# Patient Record
Sex: Male | Born: 1945
Health system: Southern US, Community
[De-identification: ages and names within clinical notes are randomized; demographics above are authoritative.]

## PROBLEM LIST (undated history)

## (undated) DIAGNOSIS — I509 Heart failure, unspecified: Secondary | ICD-10-CM

## (undated) DIAGNOSIS — I10 Essential (primary) hypertension: Secondary | ICD-10-CM

## (undated) DIAGNOSIS — F0390 Unspecified dementia without behavioral disturbance: Secondary | ICD-10-CM

## (undated) DIAGNOSIS — F40298 Other specified phobia: Secondary | ICD-10-CM

## (undated) DIAGNOSIS — R7989 Other specified abnormal findings of blood chemistry: Secondary | ICD-10-CM

## (undated) DIAGNOSIS — Z87891 Personal history of nicotine dependence: Secondary | ICD-10-CM

## (undated) DIAGNOSIS — J45909 Unspecified asthma, uncomplicated: Secondary | ICD-10-CM

## (undated) DIAGNOSIS — D473 Essential (hemorrhagic) thrombocythemia: Secondary | ICD-10-CM

## (undated) DIAGNOSIS — D751 Secondary polycythemia: Secondary | ICD-10-CM

## (undated) DIAGNOSIS — N183 Chronic kidney disease, stage 3 unspecified: Secondary | ICD-10-CM

## (undated) DIAGNOSIS — N184 Chronic kidney disease, stage 4 (severe): Secondary | ICD-10-CM

## (undated) HISTORY — DX: Unspecified dementia, unspecified severity, without behavioral disturbance, psychotic disturbance, mood disturbance, and anxiety: F03.90

## (undated) HISTORY — DX: Chronic kidney disease, stage 4 (severe): N18.4

## (undated) HISTORY — DX: Heart failure, unspecified: I50.9

## (undated) HISTORY — DX: Unspecified asthma, uncomplicated: J45.909

---
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
## 2013-07-01 ENCOUNTER — Emergency Department (INDEPENDENT_AMBULATORY_CARE_PROVIDER_SITE_OTHER)
Admission: EM | Admit: 2013-07-01 | Discharge: 2013-07-01 | Disposition: A | Discharge status: Home / Self Care | Payer: Medicare PPO | Source: Home / Self Care | Attending: Family Medicine | Admitting: Family Medicine

## 2013-07-01 ENCOUNTER — Encounter (HOSPITAL_COMMUNITY): Payer: Self-pay | Admitting: Emergency Medicine

## 2013-07-01 DIAGNOSIS — I10 Essential (primary) hypertension: Secondary | ICD-10-CM

## 2013-07-01 HISTORY — DX: Essential (primary) hypertension: I10

## 2013-07-01 NOTE — ED Notes (Signed)
Pt  Is  OUT   Of  His  Lisinopril 10  Mg  Has  Not had  Any  In  sev months  He has  No  Dr in  [facility]         His  Only symptom  Is  Some  occasonal  dizzyness

## 2013-07-01 NOTE — ED Provider Notes (Signed)
Patient presents for refill of his 10 mg lisinopril. His blood pressure is very high. We attempted to obtain an i-STAT chem 8 for evaluation for renal dysfunction. At that time, the patient left our facility, stating he would rather have high blood pressure than get stuck with a needle. I offered to refill his medication without checking the blood tests, he still refused.  Filed Vitals:   07/01/13 1318  BP: 238/134  Pulse: 110  Temp: 97.9 F (36.6 C)  Resp: [address] PA-C 07/01/13 1342  Graylon Good, PA-C 07/01/13 1354

## 2013-07-01 NOTE — ED Notes (Signed)
Pt  Refused     To  Have    Blood         Work      Done  For       i     Stat        Pt   Offered    To speak  With    Ian Malkin the  Pa         But  He  Refused  And  [employer]  Refusing to  Speak to staff

## 2013-07-01 NOTE — ED Notes (Signed)
Went in to draw this pt's blood. This pt refused blood draw, stating, "Ain't nobody going to stick a needle in me. I'd rather just have high blood." Pt & family were counseled on the necessity of the blood test. Pt and family member began walking out of room. Alinda Money, RN & Zack, PA notified of same.

## 2013-07-03 NOTE — ED Provider Notes (Signed)
Medical screening examination/treatment/procedure(s) were performed by resident physician or non-physician practitioner and as supervising physician I was immediately available for consultation/collaboration.   Skylinn Vialpando,Daxtyn DOUGLAS MD.   Jarom D Kyleigha Markert, MD 07/03/13 1918 

## 2016-07-24 DIAGNOSIS — I10 Essential (primary) hypertension: Secondary | ICD-10-CM | POA: Diagnosis not present

## 2016-07-24 DIAGNOSIS — Z1211 Encounter for screening for malignant neoplasm of colon: Secondary | ICD-10-CM | POA: Diagnosis not present

## 2017-03-06 DIAGNOSIS — H40013 Open angle with borderline findings, low risk, bilateral: Secondary | ICD-10-CM | POA: Diagnosis not present

## 2017-03-06 DIAGNOSIS — H5213 Myopia, bilateral: Secondary | ICD-10-CM | POA: Diagnosis not present

## 2017-06-07 DIAGNOSIS — H40013 Open angle with borderline findings, low risk, bilateral: Secondary | ICD-10-CM | POA: Diagnosis not present

## 2017-07-25 DIAGNOSIS — H401131 Primary open-angle glaucoma, bilateral, mild stage: Secondary | ICD-10-CM | POA: Diagnosis not present

## 2017-12-31 DIAGNOSIS — H401131 Primary open-angle glaucoma, bilateral, mild stage: Secondary | ICD-10-CM | POA: Diagnosis not present

## 2018-06-05 DIAGNOSIS — H401131 Primary open-angle glaucoma, bilateral, mild stage: Secondary | ICD-10-CM | POA: Diagnosis not present

## 2018-11-05 DIAGNOSIS — H401132 Primary open-angle glaucoma, bilateral, moderate stage: Secondary | ICD-10-CM | POA: Diagnosis not present

## 2019-01-06 DIAGNOSIS — H401132 Primary open-angle glaucoma, bilateral, moderate stage: Secondary | ICD-10-CM | POA: Diagnosis not present

## 2019-01-09 ENCOUNTER — Encounter: Payer: Self-pay | Admitting: Family Medicine

## 2019-01-09 ENCOUNTER — Other Ambulatory Visit: Payer: Self-pay

## 2019-01-09 ENCOUNTER — Ambulatory Visit (INDEPENDENT_AMBULATORY_CARE_PROVIDER_SITE_OTHER): Payer: Medicare HMO | Admitting: Family Medicine

## 2019-01-09 VITALS — BP 177/107 | HR 98 | Temp 98.8°F | Wt 170.0 lb

## 2019-01-09 DIAGNOSIS — R5383 Other fatigue: Secondary | ICD-10-CM | POA: Insufficient documentation

## 2019-01-09 DIAGNOSIS — Z7689 Persons encountering health services in other specified circumstances: Secondary | ICD-10-CM

## 2019-01-09 DIAGNOSIS — Z9119 Patient's noncompliance with other medical treatment and regimen: Secondary | ICD-10-CM

## 2019-01-09 DIAGNOSIS — Z91199 Patient's noncompliance with other medical treatment and regimen due to unspecified reason: Secondary | ICD-10-CM

## 2019-01-09 DIAGNOSIS — I10 Essential (primary) hypertension: Secondary | ICD-10-CM | POA: Diagnosis not present

## 2019-01-09 NOTE — Progress Notes (Signed)
Subjective:  Documentation for virtual audio and video telecommunications through [place] encounter:  The patient was located at home. 2 patient identifiers used.  The provider was located in the office. The patient did consent to this visit and is aware of possible charges through their insurance for this visit.  The other persons participating in this telemedicine service were his daughter James Bryant.     Patient ID: James Bryant, male    DOB: 28-May-1946, 73 y.o.   MRN: [id number]  HPI Chief Complaint  Patient presents with  . bp issues    bp issues. elevated for a week. 170/99, been on bp med in the past   This is a virtual visit to establish care and to discuss HTN.   Previous medical care: Dr. Clayton Bryant" at [facility]. States he has not been there in at least one year.  He has no other providers.   His daughter is helping with the visit although the patient denies any memory or cognitive issues.   History of uncontrolled and untreated HTN for several years.   Last time he was on medication was more than one year. He was taking lisinopril at one point.   Stopped medication due to not being willing to have labs drawn to check renal function among other labs. His daughter states he is extremely fearful of needles.   Denies having any other significant PMH.   His daughter states over the past few months he has become increasingly tired and sits mostly during the day. She also reports LE edema to his bilateral feet and ankles.  Occasionally she has noticed that he takes a deep breath and seems winded when talking.   Denies fever, chills, chest pain, abdominal pain, N/V/D.   Former smoker and stopped many years ago.   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 Moved here from [place].    Review of Systems Pertinent positives and negatives in the history of present illness.     Objective:   Physical Exam BP (!) 177/107   Pulse 98   Temp 98.8 F (37.1 C) (Oral)   Wt 170  lb (77.1 kg)   Alert and in no acute distress.  Respirations appear unlabored.  Unable to perform a physical exam due to this being a virtual visit.      Assessment & Plan:  Uncontrolled hypertension  Encounter to establish care  Fatigue, unspecified type  Personal history of noncompliance with medical treatment, presenting hazards to health  Discussed limitations of a virtual visit.  This is particularly difficult due to him being a new patient. I will attempt to request recent medical records from [facility].  Discussed potential health consequences associated with uncontrolled hypertension.  He appears to have uncontrolled and untreated hypertension for several years. His daughter states that the patient is extremely fearful of needles and this is why he has not followed up with his PCP in the past as recommended. He is agreeable to come into the office next week for a visit so that I can properly examine him and he is aware that he will need an EKG and labs as well.  We will also need a urinalysis. Encourage them to bring in the blood pressure machine they are using so that we may compare it against ours. Advised patient and daughter that if he develops chest pain palpitations or shortness of breath that he should call 911 or go to the emergency department. Follow-up next week in my  office.   Time spent on call was 26 minutes and in review of previous records 3 minutes total.  This virtual service is not related to other E/M service within previous 7 days.  99441 (5-22min) 99442 (11-28min) 99443 (21-31min)

## 2019-01-15 ENCOUNTER — Other Ambulatory Visit: Payer: Self-pay

## 2019-01-15 ENCOUNTER — Telehealth: Payer: Self-pay | Admitting: Internal Medicine

## 2019-01-15 ENCOUNTER — Ambulatory Visit (INDEPENDENT_AMBULATORY_CARE_PROVIDER_SITE_OTHER): Payer: Medicare HMO | Admitting: Family Medicine

## 2019-01-15 ENCOUNTER — Encounter: Payer: Self-pay | Admitting: Family Medicine

## 2019-01-15 VITALS — BP 150/100 | HR 117 | Temp 96.4°F | Resp 16 | Ht 65.0 in | Wt 168.6 lb

## 2019-01-15 DIAGNOSIS — R5383 Other fatigue: Secondary | ICD-10-CM | POA: Diagnosis not present

## 2019-01-15 DIAGNOSIS — R809 Proteinuria, unspecified: Secondary | ICD-10-CM | POA: Diagnosis not present

## 2019-01-15 DIAGNOSIS — I10 Essential (primary) hypertension: Secondary | ICD-10-CM | POA: Diagnosis not present

## 2019-01-15 DIAGNOSIS — R9431 Abnormal electrocardiogram [ECG] [EKG]: Secondary | ICD-10-CM

## 2019-01-15 LAB — POCT URINALYSIS DIP (PROADVANTAGE DEVICE)
Glucose, UA: NEGATIVE mg/dL
Ketones, POC UA: NEGATIVE mg/dL
Leukocytes, UA: NEGATIVE
Nitrite, UA: NEGATIVE
Protein Ur, POC: 100 mg/dL — AB
Specific Gravity, Urine: 1.03
Urobilinogen, Ur: NEGATIVE
pH, UA: 6 (ref 5.0–8.0)

## 2019-01-15 NOTE — Telephone Encounter (Signed)
I am referring him to cardiology. Hoped to get the echo ahead of time so that they would have this information but since it is not approved, I will defer to the cardiologist for further testing. Thanks.

## 2019-01-15 NOTE — Telephone Encounter (Signed)
Echocardiogram was not approved and has to go through clincial review. Pt's insurance company will fax over form to be completed and needs to be faxed back to them at [place] phone number [phone] Imaging location- [facility]- [address] Reference number - [id number]

## 2019-01-15 NOTE — Patient Instructions (Signed)
DASH Eating Plan  DASH stands for "Dietary Approaches to Stop Hypertension." The DASH eating plan is a healthy eating plan that has been shown to reduce high blood pressure (hypertension). It may also reduce your risk for type 2 diabetes, heart disease, and stroke. The DASH eating plan may also help with weight loss.  What are tips for following this plan?    General guidelines   Avoid eating more than 2,300 mg (milligrams) of salt (sodium) a day. If you have hypertension, you may need to reduce your sodium intake to 1,500 mg a day.   Limit alcohol intake to no more than 1 drink a day for nonpregnant women and 2 drinks a day for men. One drink equals 12 oz of beer, 5 oz of wine, or 1 oz of hard liquor.   Work with your health care provider to maintain a healthy body weight or to lose weight. Ask what an ideal weight is for you.   Get at least 30 minutes of exercise that causes your heart to beat faster (aerobic exercise) most days of the week. Activities may include walking, swimming, or biking.   Work with your health care provider or diet and nutrition specialist (dietitian) to adjust your eating plan to your individual calorie needs.  Reading food labels     Check food labels for the amount of sodium per serving. Choose foods with less than 5 percent of the Daily Value of sodium. Generally, foods with less than 300 mg of sodium per serving fit into this eating plan.   To find whole grains, look for the word "whole" as the first word in the ingredient list.  Shopping   Buy products labeled as "low-sodium" or "no salt added."   Buy fresh foods. Avoid canned foods and premade or frozen meals.  Cooking   Avoid adding salt when cooking. Use salt-free seasonings or herbs instead of table salt or sea salt. Check with your health care provider or pharmacist before using salt substitutes.   Do not fry foods. Cook foods using healthy methods such as baking, boiling, grilling, and broiling instead.   Cook with  heart-healthy oils, such as olive, canola, soybean, or sunflower oil.  Meal planning   Eat a balanced diet that includes:  ? 5 or more servings of fruits and vegetables each day. At each meal, try to fill half of your plate with fruits and vegetables.  ? Up to 6-8 servings of whole grains each day.  ? Less than 6 oz of lean meat, poultry, or fish each day. A 3-oz serving of meat is about the same size as a deck of cards. One egg equals 1 oz.  ? 2 servings of low-fat dairy each day.  ? A serving of nuts, seeds, or beans 5 times each week.  ? Heart-healthy fats. Healthy fats called Omega-3 fatty acids are found in foods such as flaxseeds and coldwater fish, like sardines, salmon, and mackerel.   Limit how much you eat of the following:  ? Canned or prepackaged foods.  ? Food that is high in trans fat, such as fried foods.  ? Food that is high in saturated fat, such as fatty meat.  ? Sweets, desserts, sugary drinks, and other foods with added sugar.  ? Full-fat dairy products.   Do not salt foods before eating.   Try to eat at least 2 vegetarian meals each week.   Eat more home-cooked food and less restaurant, buffet, and fast food.     When eating at a restaurant, ask that your food be prepared with less salt or no salt, if possible.  What foods are recommended?  The items listed may not be a complete list. Talk with your dietitian about what dietary choices are best for you.  Grains  Whole-grain or whole-wheat bread. Whole-grain or whole-wheat pasta. Brown rice. Oatmeal. Quinoa. Bulgur. Whole-grain and low-sodium cereals. Pita bread. Low-fat, low-sodium crackers. Whole-wheat flour tortillas.  Vegetables  Fresh or frozen vegetables (raw, steamed, roasted, or grilled). Low-sodium or reduced-sodium tomato and vegetable juice. Low-sodium or reduced-sodium tomato sauce and tomato paste. Low-sodium or reduced-sodium canned vegetables.  Fruits  All fresh, dried, or frozen fruit. Canned fruit in natural juice (without  added sugar).  Meat and other protein foods  Skinless chicken or turkey. Ground chicken or turkey. Pork with fat trimmed off. Fish and seafood. Egg whites. Dried beans, peas, or lentils. Unsalted nuts, nut butters, and seeds. Unsalted canned beans. Lean cuts of beef with fat trimmed off. Low-sodium, lean deli meat.  Dairy  Low-fat (1%) or fat-free (skim) milk. Fat-free, low-fat, or reduced-fat cheeses. Nonfat, low-sodium ricotta or cottage cheese. Low-fat or nonfat yogurt. Low-fat, low-sodium cheese.  Fats and oils  Soft margarine without trans fats. Vegetable oil. Low-fat, reduced-fat, or light mayonnaise and salad dressings (reduced-sodium). Canola, safflower, olive, soybean, and sunflower oils. Avocado.  Seasoning and other foods  Herbs. Spices. Seasoning mixes without salt. Unsalted popcorn and pretzels. Fat-free sweets.  What foods are not recommended?  The items listed may not be a complete list. Talk with your dietitian about what dietary choices are best for you.  Grains  Baked goods made with fat, such as croissants, muffins, or some breads. Dry pasta or rice meal packs.  Vegetables  Creamed or fried vegetables. Vegetables in a cheese sauce. Regular canned vegetables (not low-sodium or reduced-sodium). Regular canned tomato sauce and paste (not low-sodium or reduced-sodium). Regular tomato and vegetable juice (not low-sodium or reduced-sodium). Pickles. Olives.  Fruits  Canned fruit in a light or heavy syrup. Fried fruit. Fruit in cream or butter sauce.  Meat and other protein foods  Fatty cuts of meat. Ribs. Fried meat. Bacon. Sausage. Bologna and other processed lunch meats. Salami. Fatback. Hotdogs. Bratwurst. Salted nuts and seeds. Canned beans with added salt. Canned or smoked fish. Whole eggs or egg yolks. Chicken or turkey with skin.  Dairy  Whole or 2% milk, cream, and half-and-half. Whole or full-fat cream cheese. Whole-fat or sweetened yogurt. Full-fat cheese. Nondairy creamers. Whipped toppings.  Processed cheese and cheese spreads.  Fats and oils  Butter. Stick margarine. Lard. Shortening. Ghee. Bacon fat. Tropical oils, such as coconut, palm kernel, or palm oil.  Seasoning and other foods  Salted popcorn and pretzels. Onion salt, garlic salt, seasoned salt, table salt, and sea salt. Worcestershire sauce. Tartar sauce. Barbecue sauce. Teriyaki sauce. Soy sauce, including reduced-sodium. Steak sauce. Canned and packaged gravies. Fish sauce. Oyster sauce. Cocktail sauce. Horseradish that you find on the shelf. Ketchup. Mustard. Meat flavorings and tenderizers. Bouillon cubes. Hot sauce and Tabasco sauce. Premade or packaged marinades. Premade or packaged taco seasonings. Relishes. Regular salad dressings.  Where to find more information:   National Heart, Lung, and Blood Institute: www.nhlbi.nih.gov   American Heart Association: www.heart.org  Summary   The DASH eating plan is a healthy eating plan that has been shown to reduce high blood pressure (hypertension). It may also reduce your risk for type 2 diabetes, heart disease, and stroke.   With the   DASH eating plan, you should limit salt (sodium) intake to 2,300 mg a day. If you have hypertension, you may need to reduce your sodium intake to 1,500 mg a day.   When on the DASH eating plan, aim to eat more fresh fruits and vegetables, whole grains, lean proteins, low-fat dairy, and heart-healthy fats.   Work with your health care provider or diet and nutrition specialist (dietitian) to adjust your eating plan to your individual calorie needs.  This information is not intended to replace advice given to you by your health care provider. Make sure you discuss any questions you have with your health care provider.  Document Released: 07/27/2011 Document Revised: 07/31/2016 Document Reviewed: 07/31/2016  Elsevier Interactive Patient Education  2019 Elsevier Inc.

## 2019-01-15 NOTE — Progress Notes (Signed)
Subjective:    Patient ID: James Bryant, male    DOB: 1946-01-15, 73 y.o.   MRN: [id number]  HPI Chief Complaint  Patient presents with  . follow-up    fasting follow-up,    James Bryant is new to me. Is from [place] but lived in [place], [place] for many years and moved back here to be close to his daughter. His wife is with him today.  No records from [place] available today. Does not appear to have had regular medical care in years.   We did a virtual visit last week to establish care and discuss uncontrolled HTN.  James Bryant has not been on medication for HTN for at least 2-3 years due to not being willing to have blood work done. States James Bryant is "deathly afraid of needles".   Denies any other significant PMH. James Bryant recalls chest pain while living in [place] and was told in the ED that James Bryant had an "old heart attack".   Denies fever, chills, unexplained weight loss, dizziness, headache, vision changes, chest pain, palpitations, shortness of breath, orthopnea, abdominal pain, N/V/D, urinary symptoms, LE edema.   Retired Administrator.  Stopped drinking alcohol 7-8 years ago.  Stopped smoking 21 years ago.  Does not exercise.  Diet is fairly healthy. States James Bryant does not eat many fried foods.   Reviewed allergies, medications, past medical, surgical, family, and social history.    Review of Systems Pertinent positives and negatives in the history of present illness.     Objective:   Physical Exam Constitutional:      General: James Bryant is not in acute distress.    Appearance: Normal appearance. James Bryant is normal weight. James Bryant is not ill-appearing.  Eyes:     Conjunctiva/sclera: Conjunctivae normal.     Pupils: Pupils are equal, round, and reactive to light.  Neck:     Musculoskeletal: Normal range of motion and neck supple.  Cardiovascular:     Rate and Rhythm: Normal rate and regular rhythm.     Pulses: Normal pulses.  Pulmonary:     Effort: Pulmonary effort is normal.     Breath sounds: Normal breath sounds.   Musculoskeletal:     Right lower leg: No edema.     Left lower leg: No edema.  Skin:    General: Skin is warm and dry.     Capillary Refill: Capillary refill takes less than 2 seconds.  Neurological:     General: No focal deficit present.     Mental Status: James Bryant is alert. Mental status is at baseline.     Cranial Nerves: No cranial nerve deficit.     Sensory: No sensory deficit.     Motor: No weakness.     Deep Tendon Reflexes: Reflexes normal.  Psychiatric:        Mood and Affect: Mood normal.        Thought Content: Thought content normal.    BP (!) 150/100   Pulse (!) 117   Temp (!) 96.4 F (35.8 C) (Tympanic)   Resp 16   Ht 5\' 5"  (1.651 m)   Wt 168 lb 9.6 oz (76.5 kg)   SpO2 98%   BMI 28.06 kg/m        Assessment & Plan:  Uncontrolled hypertension - Plan: CBC with Differential/Platelet, Comprehensive metabolic panel, POCT Urinalysis DIP (Proadvantage Device), Lipid panel, EKG 12-Lead, ECHOCARDIOGRAM COMPLETE  Fatigue, unspecified type - Plan: CBC with Differential/Platelet, Comprehensive metabolic panel, POCT Urinalysis DIP (Proadvantage Device), TSH, T4, free,  Hepatitis C antibody, HIV Antibody (routine testing w rflx), Lipid panel, VITAMIN D 25 Hydroxy (Vit-D Deficiency, Fractures), Vitamin B12, EKG 12-Lead, ECHOCARDIOGRAM COMPLETE  Proteinuria, unspecified type  Abnormal EKG - Plan: Lipid panel, ECHOCARDIOGRAM COMPLETE it was a pleasure seeing Mr. Sheard today.  James Bryant is new to me and has not had any regular health care in years.  James Bryant is terribly afraid of needles and apparently this has been an obstacle in him receiving medical care in the past. James Bryant has not been on medication for HTN for the past 2-3 years.  It took much convincing for him to agree to having blood work done today. I did stay in the room and talk to him, using distraction to help him get the blood work. James Bryant was appreciative.  ECG shows old septal infarct. James Bryant is not currently complaining of DOE,  exertional chest pain or PND.  Planned to get an echo but this was not approved. I will refer him to cardiology for further evaluation.  Awaiting labs and will start him on medications for HTN. I will also appreciate cardiology recommendation for this.  Urinalysis dipstick shows a moderate amount of protein. Check renal function and follow up.  Discussed multiple etiologies for fatigue and James Bryant thinks this is related to be bored since the pandemic.  No sign of fluid overload.  Follow up pending labs.

## 2019-01-16 ENCOUNTER — Other Ambulatory Visit: Payer: Self-pay | Admitting: Family Medicine

## 2019-01-16 DIAGNOSIS — I10 Essential (primary) hypertension: Secondary | ICD-10-CM

## 2019-01-16 MED ORDER — AMLODIPINE BESYLATE 5 MG PO TABS: 5.0000 mg | ORAL_TABLET | Freq: Every day | ORAL | 1 refills | Status: DC

## 2019-01-16 MED ORDER — HYDROCHLOROTHIAZIDE 12.5 MG PO CAPS: 12.5000 mg | ORAL_CAPSULE | Freq: Every day | ORAL | 1 refills | Status: DC

## 2019-01-16 NOTE — Telephone Encounter (Signed)
Pt was notified and referral as been sent

## 2019-01-17 ENCOUNTER — Encounter: Payer: Self-pay | Admitting: Family Medicine

## 2019-01-17 ENCOUNTER — Other Ambulatory Visit: Payer: Self-pay | Admitting: Family Medicine

## 2019-01-17 DIAGNOSIS — D75839 Thrombocytosis, unspecified: Secondary | ICD-10-CM

## 2019-01-17 DIAGNOSIS — F40231 Fear of injections and transfusions: Secondary | ICD-10-CM | POA: Insufficient documentation

## 2019-01-17 DIAGNOSIS — F40298 Other specified phobia: Secondary | ICD-10-CM

## 2019-01-17 DIAGNOSIS — R7989 Other specified abnormal findings of blood chemistry: Secondary | ICD-10-CM | POA: Insufficient documentation

## 2019-01-17 DIAGNOSIS — D751 Secondary polycythemia: Secondary | ICD-10-CM

## 2019-01-17 DIAGNOSIS — D473 Essential (hemorrhagic) thrombocythemia: Secondary | ICD-10-CM

## 2019-01-17 DIAGNOSIS — Z87891 Personal history of nicotine dependence: Secondary | ICD-10-CM

## 2019-01-17 HISTORY — DX: Other specified abnormal findings of blood chemistry: R79.89

## 2019-01-17 HISTORY — DX: Personal history of nicotine dependence: Z87.891

## 2019-01-17 HISTORY — DX: Thrombocytosis, unspecified: D75.839

## 2019-01-17 HISTORY — DX: Other specified phobia: F40.298

## 2019-01-17 HISTORY — DX: Secondary polycythemia: D75.1

## 2019-01-17 MED ORDER — AMLODIPINE BESYLATE 5 MG PO TABS: 5.0000 mg | ORAL_TABLET | Freq: Every day | ORAL | 0 refills | Status: DC

## 2019-01-17 MED ORDER — HYDROCHLOROTHIAZIDE 12.5 MG PO CAPS: 12.5000 mg | ORAL_CAPSULE | Freq: Every day | ORAL | 0 refills | Status: DC

## 2019-01-21 ENCOUNTER — Telehealth: Payer: Self-pay | Admitting: Hematology and Oncology

## 2019-01-21 LAB — COMPREHENSIVE METABOLIC PANEL
ALT: 86 IU/L — ABNORMAL HIGH (ref 0–44)
AST: 66 IU/L — ABNORMAL HIGH (ref 0–40)
Albumin/Globulin Ratio: 1.4 (ref 1.2–2.2)
Albumin: 4.9 g/dL — ABNORMAL HIGH (ref 3.7–4.7)
Alkaline Phosphatase: 193 IU/L — ABNORMAL HIGH (ref 39–117)
BUN/Creatinine Ratio: 12 (ref 10–24)
BUN: 18 mg/dL (ref 8–27)
Bilirubin Total: 1.9 mg/dL — ABNORMAL HIGH (ref 0.0–1.2)
CO2: 18 mmol/L — ABNORMAL LOW (ref 20–29)
Calcium: 10.7 mg/dL — ABNORMAL HIGH (ref 8.6–10.2)
Chloride: 95 mmol/L — ABNORMAL LOW (ref 96–106)
Creatinine, Ser: 1.53 mg/dL — ABNORMAL HIGH (ref 0.76–1.27)
GFR calc Af Amer: 51 mL/min/{1.73_m2} — ABNORMAL LOW (ref >59)
GFR calc non Af Amer: 44 mL/min/{1.73_m2} — ABNORMAL LOW (ref >59)
Globulin, Total: 3.4 g/dL (ref 1.5–4.5)
Glucose: 89 mg/dL (ref 65–99)
Potassium: 5.3 mmol/L — ABNORMAL HIGH (ref 3.5–5.2)
Sodium: 142 mmol/L (ref 134–144)
Total Protein: 8.3 g/dL (ref 6.0–8.5)

## 2019-01-21 LAB — CBC WITH DIFFERENTIAL/PLATELET
Basophils Absolute: 0.2 10*3/uL (ref 0.0–0.2)
Basos: 2 %
EOS (ABSOLUTE): 0.2 10*3/uL (ref 0.0–0.4)
Eos: 2 %
Hematocrit: 65.6 % (ref 37.5–51.0)
Hemoglobin: 20.9 g/dL (ref 13.0–17.7)
Immature Grans (Abs): 0 10*3/uL (ref 0.0–0.1)
Immature Granulocytes: 0 %
Lymphocytes Absolute: 3.8 10*3/uL — ABNORMAL HIGH (ref 0.7–3.1)
Lymphs: 31 %
MCH: 24 pg — ABNORMAL LOW (ref 26.6–33.0)
MCHC: 31.9 g/dL (ref 31.5–35.7)
MCV: 75 fL — ABNORMAL LOW (ref 79–97)
Monocytes Absolute: 1 10*3/uL — ABNORMAL HIGH (ref 0.1–0.9)
Monocytes: 8 %
Neutrophils Absolute: 7.2 10*3/uL — ABNORMAL HIGH (ref 1.4–7.0)
Neutrophils: 57 %
Platelets: 727 10*3/uL — ABNORMAL HIGH (ref 150–450)
RBC: 8.7 x10E6/uL (ref 4.14–5.80)
RDW: 24.1 % — ABNORMAL HIGH (ref 11.6–15.4)
WBC: 12.4 10*3/uL — ABNORMAL HIGH (ref 3.4–10.8)

## 2019-01-21 LAB — TSH: TSH: 2.12 u[IU]/mL (ref 0.450–4.500)

## 2019-01-21 LAB — VITAMIN B12: Vitamin B-12: 1279 pg/mL — ABNORMAL HIGH (ref 232–1245)

## 2019-01-21 LAB — VITAMIN D 25 HYDROXY (VIT D DEFICIENCY, FRACTURES): Vit D, 25-Hydroxy: 19.3 ng/mL — ABNORMAL LOW (ref 30.0–100.0)

## 2019-01-21 LAB — LIPID PANEL
Chol/HDL Ratio: 3.5 ratio (ref 0.0–5.0)
Cholesterol, Total: 218 mg/dL — ABNORMAL HIGH (ref 100–199)
HDL: 62 mg/dL (ref >39)
LDL Calculated: 139 mg/dL — ABNORMAL HIGH (ref 0–99)
Triglycerides: 87 mg/dL (ref 0–149)
VLDL Cholesterol Cal: 17 mg/dL (ref 5–40)

## 2019-01-21 LAB — T4, FREE: Free T4: 1.47 ng/dL (ref 0.82–1.77)

## 2019-01-21 LAB — HIV ANTIBODY (ROUTINE TESTING W REFLEX): HIV Screen 4th Generation wRfx: NONREACTIVE

## 2019-01-21 LAB — HEPATITIS C ANTIBODY

## 2019-01-21 NOTE — Telephone Encounter (Signed)
Pt's wife cld to sch a hem appt. Appt has been sch for the pt to see Dr. Lindi Adie on 6/11 at 930am. Aware to arrive 20 minutes early to be checked in on time. Address and location to our facility provided.

## 2019-01-29 ENCOUNTER — Other Ambulatory Visit: Payer: Medicare HMO

## 2019-01-30 ENCOUNTER — Telehealth: Payer: Self-pay | Admitting: Internal Medicine

## 2019-01-30 ENCOUNTER — Encounter: Payer: Medicare PPO | Admitting: Hematology and Oncology

## 2019-01-30 NOTE — Telephone Encounter (Signed)
I received a call from the pt's wife to r/sMR. Kelemen's appt. A new appt has been scheduled for the pt to see Dr. Walden Field on 6/24 at 1050am. Aware that Mr. Whitebread will need to arrive 20 minutes early.

## 2019-02-10 ENCOUNTER — Telehealth: Payer: Self-pay

## 2019-02-10 NOTE — Telephone Encounter (Signed)
Left message for patient to call back regarding appointment with Dr. Acie Fredrickson on 6/22 to obtain virtual visit consent. Call placed due to restrictions enacted for Covid 19.

## 2019-02-11 ENCOUNTER — Telehealth: Payer: Self-pay

## 2019-02-11 ENCOUNTER — Encounter: Payer: Self-pay | Admitting: Cardiovascular Disease

## 2019-02-11 ENCOUNTER — Telehealth (INDEPENDENT_AMBULATORY_CARE_PROVIDER_SITE_OTHER): Payer: Medicare HMO | Admitting: Cardiovascular Disease

## 2019-02-11 ENCOUNTER — Other Ambulatory Visit: Payer: Self-pay

## 2019-02-11 VITALS — BP 144/98 | HR 102 | Ht 67.0 in

## 2019-02-11 DIAGNOSIS — Z7189 Other specified counseling: Secondary | ICD-10-CM

## 2019-02-11 DIAGNOSIS — I10 Essential (primary) hypertension: Secondary | ICD-10-CM

## 2019-02-11 MED ORDER — CARVEDILOL 6.25 MG PO TABS: 6.2500 mg | ORAL_TABLET | Freq: Two times a day (BID) | ORAL | 3 refills | Status: DC

## 2019-02-11 MED ORDER — HYDROCHLOROTHIAZIDE 25 MG PO TABS: 25.0000 mg | ORAL_TABLET | Freq: Every day | ORAL | 3 refills | Status: DC

## 2019-02-11 MED ORDER — CARVEDILOL 6.25 MG PO TABS: 6.2500 mg | ORAL_TABLET | Freq: Two times a day (BID) | ORAL | 0 refills | Status: DC

## 2019-02-11 NOTE — Patient Instructions (Signed)
Medication Instructions:  1) INCREASE HYDROCHLOROTHIAZIDE to 25 mg daily 2) START CARVEDILOL (COREG) 6.25 mg twice daily  Labwork: Your provider recommends that you return for lab work in: 3 weeks.  Follow-Up: Your provider recommends that you schedule a follow-up appointment in 6 weeks with Dr. Acie Fredrickson or his assistant.

## 2019-02-11 NOTE — Progress Notes (Signed)
Virtual Visit via Video Note   This visit type was conducted due to national recommendations for restrictions regarding the COVID-19 Pandemic (e.g. social distancing) in an effort to limit this patient's exposure and mitigate transmission in our community.  Due to his co-morbid illnesses, this patient is at least at moderate risk for complications without adequate follow up.  This format is felt to be most appropriate for this patient at this time.  All issues noted in this document were discussed and addressed.  A limited physical exam was performed with this format.  Please refer to the patient's chart for his consent to telehealth for [facility].   Date:  02/11/2019   ID:  James, Bryant 01-Jul-1946, MRN [id number]  Patient Location: Home Provider Location: Office  PCP:  Girtha Rm, NP-C  Cardiologist:  Nahser ,  Electrophysiologist:  None   Evaluation Performed:  New Patient Evaluation  Chief Complaint:  HTN  History of Present Illness:    James Bryant is a 73 y.o. male with  Seen with daughter, Nilda Simmer,   We were asked to see him by Harland Dingwall, NP for further eval of his HTN.    Was recently started on amlodipine  5 mg a day and HCTZ 12. 5 a day several weeks.  Still eating salty foods.  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    Past Medical History:  Diagnosis Date  . Elevated liver function tests 01/17/2019  . Elevated serum creatinine 01/17/2019  . Fear of needles 01/17/2019  . Former smoker, stopped smoking many years ago 01/17/2019  . Hypertension   . Polycythemia 01/17/2019  . Thrombocytosis ([place]) 01/17/2019   No past surgical history on file.   Current Meds  Medication Sig  . amLODipine (NORVASC) 5 MG tablet Take 1 tablet (5 mg total) by mouth daily.  Marland Kitchen  aspirin EC 81 MG tablet Take 81 mg by mouth daily.  . hydrochlorothiazide (MICROZIDE) 12.5 MG capsule Take 1 capsule (12.5 mg total) by mouth daily.  Marland Kitchen latanoprost (XALATAN) 0.005 % ophthalmic solution Place 1 drop into both eyes 2 (two) times a day.     Allergies:   Patient has no known allergies.   Social History   Tobacco Use  . Smoking status: Former Research scientist (life sciences)  . Smokeless tobacco: Never Used  Substance Use Topics  . Alcohol use: No    Comment: stopped many years ago   . Drug use: Never     Family Hx: The patient's family history includes Heart disease in his mother; Hypertension in his mother.  ROS:   Please see the history of present illness.     All other systems reviewed and are negative.   Prior CV studies:   The following studies were reviewed today:    Labs/Other Tests and Data Reviewed:    EKG:  An ECG dated Jan 15, 2019 was personally reviewed today and demonstrated:  sinus tach at 113.    Recent Labs: 01/15/2019: ALT 86; BUN 18; Creatinine, Ser 1.53; Hemoglobin 20.9; Platelets 727; Potassium 5.3; Sodium 142; TSH 2.120   Recent Lipid Panel Lab Results  Component Value Date/Time   CHOL 218 (H) 01/15/2019 12:32 PM   TRIG 87 01/15/2019 12:32 PM   HDL 62 01/15/2019 12:32 PM   CHOLHDL 3.5  01/15/2019 12:32 PM   LDLCALC 139 (H) 01/15/2019 12:32 PM    Wt Readings from Last 3 Encounters:  01/15/19 168 lb 9.6 oz (76.5 kg)  01/09/19 170 lb (77.1 kg)     Objective:    Vital Signs:  BP (!) 144/98 (BP Location: Right Arm, Patient Position: Sitting, Cuff Size: Normal)   Pulse (!) 102   Ht 5\' 7"  (1.702 m)   BMI 26.41 kg/m    VITAL SIGNS:  reviewed GEN:  no acute distress EYES:  sclerae anicteric, EOMI - Extraocular Movements Intact RESPIRATORY:  normal respiratory effort, symmetric expansion CARDIOVASCULAR:  no peripheral edema SKIN:  no rash, lesions or ulcers. MUSCULOSKELETAL:  no obvious deformities. NEURO:  alert and oriented x 3, no obvious focal  deficit PSYCH:  normal affect  ASSESSMENT & PLAN:    1. HTN:  BP remains elevated : He still eats a fair amount of salt.  In fact in reviewing his diet, he eats a very high salty diet.  Of counseled him about avoiding excessive salt.  We will increase the hydrochlorothiazide to 25 mg a day.  We will add carvedilol 6.25 PO  twice a day. We will check a basic metabolic profile in 3 to 4 weeks.  We will see him here in the office or virtually in approximately 6 weeks.  2.  Sinus tachycardia : adding coreg 6.25 BID   Encouraged him to exercise    COVID-19 Education: The signs and symptoms of COVID-19 were discussed with the patient and how to seek care for testing (follow up with PCP or arrange E-visit).  The importance of social distancing was discussed today.  Time:   Today, I have spent  35   minutes with the patient with telehealth technology discussing the above problems.     Medication Adjustments/Labs and Tests Ordered: Current medicines are reviewed at length with the patient today.  Concerns regarding medicines are outlined above.   Tests Ordered: No orders of the defined types were placed in this encounter.   Medication Changes: No orders of the defined types were placed in this encounter.   Follow Up:  In Person in 6 week(s)  Signed, Mertie Moores, MD  02/11/2019 9:21 AM    [place] '

## 2019-02-11 NOTE — Telephone Encounter (Addendum)
Spoke with pt and obtained verbal consent for video visit.    YOUR CARDIOLOGY TEAM HAS ARRANGED FOR AN E-VISIT FOR YOUR APPOINTMENT - PLEASE REVIEW IMPORTANT INFORMATION BELOW SEVERAL DAYS PRIOR TO YOUR APPOINTMENT  Due to the recent COVID-19 pandemic, we are transitioning in-person office visits to tele-medicine visits in an effort to decrease unnecessary exposure to our patients, their families, and staff. These visits are billed to your insurance just like a normal visit is. We also encourage you to sign up for MyChart if you have not already done so. You will need a smartphone if possible. For patients that do not have this, we can still complete the visit using a regular telephone but do prefer a smartphone to enable video when possible. You may have a family member that lives with you that can help. If possible, we also ask that you have a blood pressure cuff and scale at home to measure your blood pressure, heart rate and weight prior to your scheduled appointment. Patients with clinical needs that need an in-person evaluation and testing will still be able to come to the office if absolutely necessary. If you have any questions, feel free to call our office.     YOUR PROVIDER WILL BE USING THE FOLLOWING PLATFORM TO COMPLETE YOUR VISIT: [email or web address]  . IF USING MYCHART - How to Download the MyChart App to Your SmartPhone   - If Apple, go to [employer] and type in MyChart in the search bar and download the app. If Android, ask patient to go to [employer] and type in [place] in the search bar and download the app. The app is free but as with any other app downloads, your phone may require you to verify saved payment information or Apple/Android password.  - You will need to then log into the app with your MyChart username and password, and select [facility] as your healthcare provider to link the account.  - When it is time for your visit, go to the MyChart app, find appointments, and click  Begin Video Visit. Be sure to Select Allow for your device to access the Microphone and Camera for your visit. You will then be connected, and your provider will be with you shortly.  **If you have any issues connecting or need assistance, please contact MyChart service desk [phone])**  **If using a computer, in order to ensure the best quality for your visit, you will need to use either of the following Internet Browsers: Insurance underwriter or [employer]**  . IF USING DOXIMITY or [email or web address].ME - The staff will give you instructions on receiving your link to join the meeting the day of your visit.      2-3 DAYS BEFORE YOUR APPOINTMENT  You will receive a telephone call from one of our [place] team members - your caller ID may say "Unknown caller." If this is a video visit, we will walk you through how to get the video launched on your phone. We will remind you check your blood pressure, heart rate and weight prior to your scheduled appointment. If you have an Apple Watch or Kardia, please upload any pertinent ECG strips the day before or morning of your appointment to [place]. Our staff will also make sure you have reviewed the consent and agree to move forward with your scheduled tele-health visit.     THE DAY OF YOUR APPOINTMENT  Approximately 15 minutes prior to your scheduled appointment, you will receive a telephone call from  one of [facility] team - your caller ID may say "Unknown caller."  Our staff will confirm medications, vital signs for the day and any symptoms you may be experiencing. Please have this information available prior to the time of visit start. It may also be helpful for you to have a pad of paper and pen handy for any instructions given during your visit. They will also walk you through joining the smartphone meeting if this is a video visit.    CONSENT FOR TELE-HEALTH VISIT - PLEASE REVIEW  I hereby voluntarily request, consent and authorize [facility] and its employed or contracted physicians, physician assistants, nurse practitioners or other licensed health care professionals (the Practitioner), to provide me with telemedicine health care services (the "Services") as deemed necessary by the treating Practitioner. I acknowledge and consent to receive the Services by the Practitioner via telemedicine. I understand that the telemedicine visit will involve communicating with the Practitioner through live audiovisual communication technology and the disclosure of certain medical information by electronic transmission. I acknowledge that I have been given the opportunity to request an in-person assessment or other available alternative prior to the telemedicine visit and am voluntarily participating in the telemedicine visit.  I understand that I have the right to withhold or withdraw my consent to the use of telemedicine in the course of my care at any time, without affecting my right to future care or treatment, and that the Practitioner or I may terminate the telemedicine visit at any time. I understand that I have the right to inspect all information obtained and/or recorded in the course of the telemedicine visit and may receive copies of available information for a reasonable fee.  I understand that some of the potential risks of receiving the Services via telemedicine include:  Marland Kitchen Delay or interruption in medical evaluation due to technological equipment failure or disruption; . Information transmitted may not be sufficient (e.g. poor resolution of images) to allow for appropriate medical decision making by the Practitioner; and/or  . In rare instances, security protocols could fail, causing a breach of personal health information.  Furthermore, I acknowledge that it is my responsibility to provide information about my medical history, conditions and care that is complete and accurate to the best of my ability. I acknowledge that Practitioner's  advice, recommendations, and/or decision may be based on factors not within their control, such as incomplete or inaccurate data provided by me or distortions of diagnostic images or specimens that may result from electronic transmissions. I understand that the practice of medicine is not an exact science and that Practitioner makes no warranties or guarantees regarding treatment outcomes. I acknowledge that I will receive a copy of this consent concurrently upon execution via email to the email address I last provided but may also request a printed copy by calling the office of [place].    I understand that my insurance will be billed for this visit.   I have read or had this consent read to me. . I understand the contents of this consent, which adequately explains the benefits and risks of the Services being provided via telemedicine.  . I have been provided ample opportunity to ask questions regarding this consent and the Services and have had my questions answered to my satisfaction. . I give my informed consent for the services to be provided through the use of telemedicine in my medical care  By participating in this telemedicine visit I agree to the above.

## 2019-02-11 NOTE — Telephone Encounter (Signed)
Per Dr. Acie Fredrickson. Called to schedule the patient for BMET in 3 weeks and visit with Dr. Acie Fredrickson or APP in 6 weeks.  Left message to call back.

## 2019-02-12 ENCOUNTER — Inpatient Hospital Stay: Payer: Medicare HMO | Attending: Hematology and Oncology | Admitting: Internal Medicine

## 2019-02-12 ENCOUNTER — Telehealth: Payer: Self-pay | Admitting: Internal Medicine

## 2019-02-12 ENCOUNTER — Other Ambulatory Visit: Payer: Self-pay

## 2019-02-12 ENCOUNTER — Inpatient Hospital Stay: Payer: Medicare HMO

## 2019-02-12 VITALS — BP 155/104 | HR 122 | Temp 98.7°F | Resp 18 | Ht 67.0 in | Wt 168.8 lb

## 2019-02-12 DIAGNOSIS — R899 Unspecified abnormal finding in specimens from other organs, systems and tissues: Secondary | ICD-10-CM | POA: Diagnosis not present

## 2019-02-12 DIAGNOSIS — R718 Other abnormality of red blood cells: Secondary | ICD-10-CM

## 2019-02-12 DIAGNOSIS — M2550 Pain in unspecified joint: Secondary | ICD-10-CM | POA: Diagnosis not present

## 2019-02-12 DIAGNOSIS — D471 Chronic myeloproliferative disease: Secondary | ICD-10-CM | POA: Diagnosis not present

## 2019-02-12 DIAGNOSIS — D751 Secondary polycythemia: Secondary | ICD-10-CM | POA: Diagnosis not present

## 2019-02-12 DIAGNOSIS — N289 Disorder of kidney and ureter, unspecified: Secondary | ICD-10-CM | POA: Insufficient documentation

## 2019-02-12 DIAGNOSIS — R945 Abnormal results of liver function studies: Secondary | ICD-10-CM | POA: Insufficient documentation

## 2019-02-12 DIAGNOSIS — M25559 Pain in unspecified hip: Secondary | ICD-10-CM | POA: Diagnosis not present

## 2019-02-12 DIAGNOSIS — D473 Essential (hemorrhagic) thrombocythemia: Secondary | ICD-10-CM | POA: Diagnosis not present

## 2019-02-12 DIAGNOSIS — R7989 Other specified abnormal findings of blood chemistry: Secondary | ICD-10-CM

## 2019-02-12 DIAGNOSIS — Z87891 Personal history of nicotine dependence: Secondary | ICD-10-CM | POA: Diagnosis not present

## 2019-02-12 LAB — CBC WITH DIFFERENTIAL (CANCER CENTER ONLY)
Abs Immature Granulocytes: 0.04 10*3/uL (ref 0.00–0.07)
Basophils Absolute: 0.2 10*3/uL — ABNORMAL HIGH (ref 0.0–0.1)
Basophils Relative: 1 %
Eosinophils Absolute: 0.1 10*3/uL (ref 0.0–0.5)
Eosinophils Relative: 1 %
HCT: 65.3 % — ABNORMAL HIGH (ref 39.0–52.0)
Hemoglobin: 20.1 g/dL — ABNORMAL HIGH (ref 13.0–17.0)
Immature Granulocytes: 0 %
Lymphocytes Relative: 23 %
Lymphs Abs: 2.5 10*3/uL (ref 0.7–4.0)
MCH: 24 pg — ABNORMAL LOW (ref 26.0–34.0)
MCHC: 30.8 g/dL (ref 30.0–36.0)
MCV: 77.8 fL — ABNORMAL LOW (ref 80.0–100.0)
Monocytes Absolute: 0.9 10*3/uL (ref 0.1–1.0)
Monocytes Relative: 8 %
Neutro Abs: 7.2 10*3/uL (ref 1.7–7.7)
Neutrophils Relative %: 67 %
Platelet Count: 592 10*3/uL — ABNORMAL HIGH (ref 150–400)
RBC: 8.39 MIL/uL — ABNORMAL HIGH (ref 4.22–5.81)
RDW: 24.5 % — ABNORMAL HIGH (ref 11.5–15.5)
WBC Count: 10.9 10*3/uL — ABNORMAL HIGH (ref 4.0–10.5)
nRBC: 0 % (ref 0.0–0.2)

## 2019-02-12 LAB — CMP (CANCER CENTER ONLY)
ALT: 33 U/L (ref 0–44)
AST: 29 U/L (ref 15–41)
Albumin: 4.1 g/dL (ref 3.5–5.0)
Alkaline Phosphatase: 117 U/L (ref 38–126)
Anion gap: 13 (ref 5–15)
BUN: 20 mg/dL (ref 8–23)
CO2: 23 mmol/L (ref 22–32)
Calcium: 9.7 mg/dL (ref 8.9–10.3)
Chloride: 103 mmol/L (ref 98–111)
Creatinine: 1.46 mg/dL — ABNORMAL HIGH (ref 0.61–1.24)
GFR, Est AFR Am: 54 mL/min — ABNORMAL LOW (ref >60)
GFR, Estimated: 47 mL/min — ABNORMAL LOW (ref >60)
Glucose, Bld: 95 mg/dL (ref 70–99)
Potassium: 4 mmol/L (ref 3.5–5.1)
Sodium: 139 mmol/L (ref 135–145)
Total Bilirubin: 1.9 mg/dL — ABNORMAL HIGH (ref 0.3–1.2)
Total Protein: 8.1 g/dL (ref 6.5–8.1)

## 2019-02-12 LAB — IRON AND TIBC
Iron: 57 ug/dL (ref 42–163)
Saturation Ratios: 13 % — ABNORMAL LOW (ref 20–55)
TIBC: 428 ug/dL — ABNORMAL HIGH (ref 202–409)
UIBC: 370 ug/dL (ref 117–376)

## 2019-02-12 LAB — FERRITIN: Ferritin: 31 ng/mL (ref 24–336)

## 2019-02-12 LAB — SEDIMENTATION RATE: Sed Rate: 0 mm/hr (ref 0–16)

## 2019-02-12 LAB — LACTATE DEHYDROGENASE: LDH: 327 U/L — ABNORMAL HIGH (ref 98–192)

## 2019-02-12 NOTE — Telephone Encounter (Signed)
Called and spoke with patient. Confirmed date and time of phone visit  ° °

## 2019-02-12 NOTE — Progress Notes (Signed)
Referring Physician:  Girtha Rm, NP-C  Diagnosis Microcytosis - Plan: CBC with Differential ([place] Only), CMP ([place] only), Lactate dehydrogenase (LDH), Ferritin, Iron and TIBC, BCR ABL1 FISH ([facility]), Jak 2 V617F ([facility]), Jak 2 Exon 12 ([facility]), Hemochromatosis DNA, PCR, SPEP with reflex to IFE, Erythropoietin, Sedimentation rate, Rheumatoid factor, Hepatitis B surface antigen, CANCELED: Hemoglobinopathy evaluation, CANCELED: Hepatitis B surface antibody, CANCELED: Hepatitis B core antibody, total, CANCELED: Hepatitis C antibody  Pain in joint involving pelvic region and thigh, unspecified laterality - Plan: CBC with Differential ([place] Only), CMP ([place] only), Lactate dehydrogenase (LDH), Ferritin, Iron and TIBC, BCR ABL1 FISH ([facility]), Jak 2 V617F ([facility]), Jak 2 Exon 12 ([facility]), Hemochromatosis DNA, PCR, SPEP with reflex to IFE, Erythropoietin, Sedimentation rate, Rheumatoid factor, Hepatitis B surface antigen, CANCELED: Hemoglobinopathy evaluation, CANCELED: Hepatitis B surface antibody, CANCELED: Hepatitis B core antibody, total, CANCELED: Hepatitis C antibody  Polycythemia, secondary  Abnormal laboratory test - Plan: CBC with Differential ([place] Only), CMP ([place] only), Lactate dehydrogenase (LDH), Ferritin, Iron and TIBC, BCR ABL1 FISH ([facility]), Jak 2 V617F ([facility]), Jak 2 Exon 12 ([facility]), Hemochromatosis DNA, PCR, SPEP with reflex to IFE, Erythropoietin, Sedimentation rate, Rheumatoid factor, Hepatitis B surface antigen, CANCELED: Hemoglobinopathy evaluation, CANCELED: Hepatitis B surface antibody, CANCELED: Hepatitis B core antibody, total, CANCELED: Hepatitis C antibody  Elevated liver function tests - Plan: CBC with Differential ([facility] Only), CMP ([place] only), Lactate dehydrogenase (LDH), Ferritin, Iron and TIBC, BCR ABL1 FISH ([facility]), Jak 2 V617F ([facility]), Jak 2 Exon 12 ([facility]), Hemochromatosis DNA, PCR,  SPEP with reflex to IFE, Erythropoietin, Sedimentation rate, Rheumatoid factor, Hepatitis B surface antigen, CANCELED: Hemoglobinopathy evaluation, CANCELED: Hepatitis B surface antibody, CANCELED: Hepatitis B core antibody, total, CANCELED: Hepatitis C antibody  Renal insufficiency - Plan: CBC with Differential ([facility] Only), CMP ([place] only), Lactate dehydrogenase (LDH), Ferritin, Iron and TIBC, BCR ABL1 FISH ([facility]), Jak 2 V617F ([facility]), Jak 2 Exon 12 ([facility]), Hemochromatosis DNA, PCR, SPEP with reflex to IFE, Erythropoietin, Sedimentation rate, Rheumatoid factor, Hepatitis B surface antigen, CANCELED: Hemoglobinopathy evaluation, CANCELED: Hepatitis B surface antibody, CANCELED: Hepatitis B core antibody, total, CANCELED: Hepatitis C antibody  Staging Cancer Staging No matching staging information was found for the patient.  Assessment and Plan:  1.  Polycythemia.  73 year old Bryant referred for evaluation due to elevated hematocrit and platelet count.  Pt denies any headaches, blurred vision, or neuropathy.  He reports he last smoked 20 years ago.  He quit drinking 20 years ago. He denies any blood in stool or urine.  He denies any new medications. He is not on iron.   Labs done 01/15/2019 showed WBC 12.4 HB 20.9 plts 727,000.  He has MCV of 75.  Chemistries showed K+ 5.3 Cr 1.53 and elevated LFTS with AST of 66 and ALT of 86.  Vitamin B12 elevated at 1279.  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  Pt is seen today for consultation due to elevated hematocrit and platelet count.    Labs done today 02/12/2019 reviewed and showed WBC 10.9 HB 20 HCT 65 MCV 78 plts 592,000.  Chemistries showed K+ 4 Cr 1.46 and normal LFTs.  Sed rate 0.  Ferritin low normal at 31.  TS 13.  Awaiting results of Jak 2 and BCR/ABL, EPO, Hepatitis labs.  Pt currently asymptomatic.  He will have phone visit follow-up in 2 weeks.  Differential secondary polycythemia versus primary process.    2.  Thrombocytosis.  Plt count  is 592,000 on labs done today 02/12/2019.  Awaiting results of Jak2 and BCR/ABL and EPO.  Pt will have phone visit follow-up in 2 weeks to go over results.   3.  Elevated LFTs.  Labs done 01/15/2019 showed AST of 66 and ALT of 86.  Labs done today 02/12/2019 showed normal LFTS.  Awaiting results of hepatitis testing.  Pt will have phone visit follow-up in 2 weeks.    4.  Microcytosis.  Ferritin low normal at 31.  Awaiting results of Hemoglobin electrophoresis.    5.  Joint pain.  Sed rate WNL at 0.  Awaiting results of SPEP, RF.    6.  Needle phobia.  Pt reassured about lab draw process and need for additional testing due to lab results.    7.  Health maintenance.  Pt has not had colonoscopy.  Follow-up with PCP to discuss GI referral.    40 minutes spent with more than 50% spent in review of records, counseling and coordination of care.    HPI:  73 year old Bryant referred for evaluation due to elevated hematocrit and platelet count.  Pt denies any headaches, blurred vision, or neuropathy.  He reports he last smoked 20 years ago.  He quit drinking 20 years ago. He denies any blood in stool or urine.  He denies any new medications. He is not on iron.   Labs done 01/15/2019 showed WBC 12.4 HB 20.9 plts 727,000.  He has MCV of 75.  Chemistries showed K+ 5.3 Cr 1.53 and elevated LFTS with AST of 66 and ALT of 86.  Vitamin B12 elevated at 1279.  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  Pt is seen today for consultation due to elevated hematocrit and platelet count.    Problem List Patient Active Problem List   Diagnosis Date Noted  . Fear of needles [F40.298] 01/17/2019  . Elevated serum creatinine [R79.89] 01/17/2019  . Elevated liver function tests [R94.5] 01/17/2019  . Polycythemia [D75.1] 01/17/2019  . Thrombocytosis ([place]) [D47.3] 01/17/2019  . Former smoker, stopped smoking many years ago [Z87.891] 01/17/2019  . Uncontrolled hypertension [I10] 01/09/2019  . Personal history of noncompliance with medical  treatment, presenting hazards to health [Z91.19] 01/09/2019  . Fatigue [R53.83] 01/09/2019    Past Medical History Past Medical History:  Diagnosis Date  . Elevated liver function tests 01/17/2019  . Elevated serum creatinine 01/17/2019  . Fear of needles 01/17/2019  . Former smoker, stopped smoking many years ago 01/17/2019  . Hypertension   . Polycythemia 01/17/2019  . Thrombocytosis ([place]) 01/17/2019    Past Surgical History No past surgical history on file.  Family History Family History  Problem Relation Age of Onset  . Heart disease Mother   . Hypertension Mother      Social History  reports that he has quit smoking. He has never used smokeless tobacco. He reports that he does not drink alcohol or use drugs.  Medications  Current Outpatient Medications:  .  amLODipine (NORVASC) 5 MG tablet, Take 1 tablet (5 mg total) by mouth daily., Disp: 90 tablet, Rfl: 1 .  aspirin EC 81 MG tablet, Take 81 mg by mouth daily., Disp: , Rfl:  .  carvedilol (COREG) 6.25 MG tablet, Take 1 tablet (6.25 mg total) by mouth 2 (two) times daily., Disp: 180 tablet, Rfl: 3 .  hydrochlorothiazide (HYDRODIURIL) 25 MG tablet, Take 1 tablet (25 mg total) by mouth daily., Disp: 90 tablet, Rfl: 3 .  latanoprost (XALATAN) 0.005 % ophthalmic solution,  Place 1 drop into both eyes 2 (two) times a day., Disp: , Rfl:   Allergies Patient has no known allergies.  Review of Systems Review of Systems - Oncology ROS negative   Physical Exam  Vitals Wt Readings from Last 3 Encounters:  02/12/19 168 lb 12.8 oz (76.6 kg)  01/15/19 168 lb 9.6 oz (76.5 kg)  01/09/19 170 lb (77.1 kg)   Temp Readings from Last 3 Encounters:  02/12/19 98.7 F (37.1 C) (Oral)  01/15/19 (!) 96.4 F (35.8 C) (Tympanic)  01/09/19 98.8 F (37.1 C) (Oral)   BP Readings from Last 3 Encounters:  02/12/19 (!) 155/104  02/11/19 (!) 144/98  01/15/19 (!) 150/100   Pulse Readings from Last 3 Encounters:  02/12/19 (!) 122   02/11/19 (!) 102  01/15/19 (!) 117   Constitutional: Well-developed, well-nourished, and in no distress.   HENT: Head: Normocephalic and atraumatic.  Mouth/Throat: No oropharyngeal exudate. Mucosa moist. Eyes: Pupils are equal, round, and reactive to light. Conjunctivae are normal. No scleral icterus.  Neck: Normal range of motion. Neck supple. No JVD present.  Cardiovascular: Normal rate, regular rhythm and normal heart sounds.  Exam reveals no gallop and no friction rub.   No murmur heard. Pulmonary/Chest: Effort normal and breath sounds normal. No respiratory distress. No wheezes.No rales.  Abdominal: Soft. Bowel sounds are normal. No distension. There is no tenderness. There is no guarding.  Musculoskeletal: No edema or tenderness.  Lymphadenopathy: No cervical,axillary or supraclavicular adenopathy.  Neurological: Alert and oriented to person, place, and time. No cranial nerve deficit.  Skin: Skin is warm and dry. No rash noted. No erythema. No pallor.  Psychiatric: Affect and judgment normal.   Labs Appointment on 02/12/2019  Component Date Value Ref Range Status  . Sed Rate 02/12/2019 0  0 - 16 mm/hr Final   Performed at [facility] [address], [place], [place] [address]  . Iron 02/12/2019 57  James - 163 ug/dL Final  . TIBC 02/12/2019 428* 202 - 409 ug/dL Final  . Saturation Ratios 02/12/2019 13* 20 - 55 % Final  . UIBC 02/12/2019 370  117 - 376 ug/dL Final   Performed at [facility], [place] [address], [place], [place] [address]  . Ferritin 02/12/2019 31  24 - 336 ng/mL Final   Performed at [facility], [place] [address] [place], [place] [address]  . LDH 02/12/2019 327* 98 - 192 U/L Final   Performed at [facility], [place] [address], [place], [place] [address]  . Sodium 02/12/2019 139  135 - 145 mmol/L Final  . Potassium 02/12/2019 4.0  3.5 - 5.1 mmol/L Final  . Chloride  02/12/2019 103  98 - 111 mmol/L Final  . CO2 02/12/2019 23  22 - 32 mmol/L Final  . Glucose, Bld 02/12/2019 95  70 - 99 mg/dL Final  . BUN 02/12/2019 20  8 - 23 mg/dL Final  . Creatinine 02/12/2019 1.46* 0.61 - 1.24 mg/dL Final  . Calcium 02/12/2019 9.7  8.9 - 10.3 mg/dL Final  . Total Protein 02/12/2019 8.1  6.5 - 8.1 g/dL Final  . Albumin 02/12/2019 4.1  3.5 - 5.0 g/dL Final  . AST 02/12/2019 29  15 - 41 U/L Final  . ALT 02/12/2019 33  0 - 44 U/L Final  . Alkaline Phosphatase 02/12/2019 117  38 - 126 U/L Final  . Total Bilirubin 02/12/2019 1.9* 0.3 - 1.2 mg/dL Final  . GFR,  Est Non Af Am 02/12/2019 47* >60 mL/min Final  . GFR, Est AFR Am 02/12/2019 54* >60 mL/min Final  . Anion gap 02/12/2019 13  5 - 15 Final   Performed at [facility], [place] [address], [place], [place] [address]  . WBC Count 02/12/2019 10.9* 4.0 - 10.5 K/uL Final  . RBC 02/12/2019 8.39* 4.22 - 5.81 MIL/uL Final  . Hemoglobin 02/12/2019 20.1* 13.0 - 17.0 g/dL Final  . HCT 02/12/2019 65.3* 39.0 - 52.0 % Final  . MCV 02/12/2019 77.8* 80.0 - 100.0 fL Final  . MCH 02/12/2019 24.0* 26.0 - 34.0 pg Final  . MCHC 02/12/2019 30.8  30.0 - 36.0 g/dL Final  . RDW 02/12/2019 24.5* 11.5 - 15.5 % Final  . Platelet Count 02/12/2019 592* 150 - 400 K/uL Final  . nRBC 02/12/2019 0.0  0.0 - 0.2 % Final  . Neutrophils Relative % 02/12/2019 67  % Final  . Neutro Abs 02/12/2019 7.2  1.7 - 7.7 K/uL Final  . Lymphocytes Relative 02/12/2019 23  % Final  . Lymphs Abs 02/12/2019 2.5  0.7 - 4.0 K/uL Final  . Monocytes Relative 02/12/2019 8  % Final  . Monocytes Absolute 02/12/2019 0.9  0.1 - 1.0 K/uL Final  . Eosinophils Relative 02/12/2019 1  % Final  . Eosinophils Absolute 02/12/2019 0.1  0.0 - 0.5 K/uL Final  . Basophils Relative 02/12/2019 1  % Final  . Basophils Absolute 02/12/2019 0.2* 0.0 - 0.1 K/uL Final  . Immature Granulocytes 02/12/2019 0  % Final  . Abs Immature Granulocytes 02/12/2019 0.04  0.00 - 0.07  K/uL Final   Performed at [facility], [place] [address] [place], [place] [address]     Pathology Orders Placed This Encounter  Procedures  . CBC with Differential ([facility] Center Only)    Standing Status:   Future    Number of Occurrences:   1    Standing Expiration Date:   02/12/2020  . CMP ([place] only)    Standing Status:   Future    Number of Occurrences:   1    Standing Expiration Date:   02/12/2020  . Lactate dehydrogenase (LDH)    Standing Status:   Future    Number of Occurrences:   1    Standing Expiration Date:   02/12/2020  . Ferritin    Standing Status:   Future    Number of Occurrences:   1    Standing Expiration Date:   02/12/2020  . Iron and TIBC    Standing Status:   Future    Number of Occurrences:   1    Standing Expiration Date:   02/12/2020  . BCR ABL1 FISH ([facility])    Standing Status:   Future    Number of Occurrences:   1    Standing Expiration Date:   02/12/2020  . Jak 2 V617F ([facility])    Standing Status:   Future    Number of Occurrences:   1    Standing Expiration Date:   02/12/2020  . Jak 2 Exon 12 ([facility])    Standing Status:   Future    Number of Occurrences:   1    Standing Expiration Date:   02/12/2020  . Hemochromatosis DNA, PCR    Standing Status:   Future    Number of Occurrences:   1    Standing Expiration Date:   02/12/2020  . SPEP with reflex to IFE  Standing Status:   Future    Number of Occurrences:   1    Standing Expiration Date:   02/12/2020  . Erythropoietin    Standing Status:   Future    Number of Occurrences:   1    Standing Expiration Date:   02/12/2020  . Sedimentation rate    Standing Status:   Future    Number of Occurrences:   1    Standing Expiration Date:   02/12/2020  . Rheumatoid factor    Standing Status:   Future    Number of Occurrences:   1    Standing Expiration Date:   02/12/2020  . Hepatitis B surface antigen    Standing Status:   Future    Number of Occurrences:   1     Standing Expiration Date:   02/12/2020  . Hepatitis B core antibody, total  . Hepatitis B surface antibody,qualitative  . Hepatitis C antibody  . Hemoglobinopathy evaluation       Zoila Shutter MD

## 2019-02-13 ENCOUNTER — Encounter (HOSPITAL_COMMUNITY): Payer: Self-pay | Admitting: Family Medicine

## 2019-02-13 LAB — HEPATITIS B SURFACE ANTIBODY,QUALITATIVE: Hep B S Ab: NONREACTIVE

## 2019-02-13 LAB — PROTEIN ELECTROPHORESIS, SERUM, WITH REFLEX
A/G Ratio: 1.1 (ref 0.7–1.7)
Albumin ELP: 3.9 g/dL (ref 2.9–4.4)
Alpha-1-Globulin: 0.2 g/dL (ref 0.0–0.4)
Alpha-2-Globulin: 0.6 g/dL (ref 0.4–1.0)
Beta Globulin: 1.4 g/dL — ABNORMAL HIGH (ref 0.7–1.3)
Gamma Globulin: 1.4 g/dL (ref 0.4–1.8)
Globulin, Total: 3.6 g/dL (ref 2.2–3.9)
Total Protein ELP: 7.5 g/dL (ref 6.0–8.5)

## 2019-02-13 LAB — HEPATITIS B SURFACE ANTIGEN: Hepatitis B Surface Ag: NEGATIVE

## 2019-02-13 LAB — HEPATITIS B CORE ANTIBODY, TOTAL: Hep B Core Total Ab: NEGATIVE

## 2019-02-13 LAB — ERYTHROPOIETIN: Erythropoietin: 2.6 m[IU]/mL (ref 2.6–18.5)

## 2019-02-13 LAB — RHEUMATOID FACTOR: Rhuematoid fact SerPl-aCnc: 20.8 IU/mL — ABNORMAL HIGH (ref 0.0–13.9)

## 2019-02-13 LAB — HEPATITIS C ANTIBODY: HCV Ab: 0.1 s/co ratio (ref 0.0–0.9)

## 2019-02-14 LAB — HEMOGLOBINOPATHY EVALUATION
Hgb A2 Quant: 2.1 % (ref 1.8–3.2)
Hgb A: 97.9 % (ref 96.4–98.8)
Hgb C: 0 %
Hgb F Quant: 0 % (ref 0.0–2.0)
Hgb S Quant: 0 %
Hgb Variant: 0 %

## 2019-02-17 NOTE — Telephone Encounter (Signed)
Spoke with patient and his daughter who are aware of appointment with Richardson Dopp, PA on 8/12.  Daughter states patient had recent lab work and I explained the reason for bmet in 2-3 weeks due to the recent addition of medications. She asks if they can call back to schedule the lab appointment because they are on their way to another appointment. I confirmed that we have DPR on file to speak with daughter or wife. Patient's daughter thanked me for the call.

## 2019-02-20 LAB — HEMOCHROMATOSIS DNA-PCR(C282Y,H63D)

## 2019-02-25 ENCOUNTER — Telehealth: Payer: Self-pay | Admitting: Internal Medicine

## 2019-02-25 NOTE — Telephone Encounter (Signed)
Left VM to confirm appt/verify info °

## 2019-02-26 ENCOUNTER — Other Ambulatory Visit: Payer: Self-pay

## 2019-02-26 ENCOUNTER — Ambulatory Visit (INDEPENDENT_AMBULATORY_CARE_PROVIDER_SITE_OTHER): Payer: Medicare HMO | Admitting: Family Medicine

## 2019-02-26 ENCOUNTER — Telehealth: Payer: Self-pay | Admitting: Internal Medicine

## 2019-02-26 ENCOUNTER — Inpatient Hospital Stay: Payer: Medicare HMO | Attending: Hematology and Oncology | Admitting: Internal Medicine

## 2019-02-26 DIAGNOSIS — F40298 Other specified phobia: Secondary | ICD-10-CM

## 2019-02-26 DIAGNOSIS — D45 Polycythemia vera: Secondary | ICD-10-CM | POA: Diagnosis not present

## 2019-02-26 NOTE — Progress Notes (Signed)
Virtual Visit via Telephone Note  I connected with James Bryant on 02/26/19 at 10:10 AM EDT by telephone and verified that I am speaking with the correct person using two identifiers.   I discussed the limitations, risks, security and privacy concerns of performing an evaluation and management service by telephone and the availability of in person appointments. I also discussed with the patient that there may be a patient responsible charge related to this service. The patient expressed understanding and agreed to proceed.  Interval History.  Historical data obtained from note dated 02/12/2019.  73 year old male referred for evaluation due to elevated hematocrit and platelet count.  Pt denies any headaches, blurred vision, or neuropathy.  He reports he last smoked 20 years ago.  He quit drinking 20 years ago. He denies any blood in stool or urine.  He denies any new medications. He is not on iron.   Labs done 01/15/2019 showed WBC 12.4 HB 20.9 plts 727,000.  He has MCV of 75.  Chemistries showed K+ 5.3 Cr 1.53 and elevated LFTS with AST of 66 and ALT of 86.  Vitamin B12 elevated at 1279.  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     Observations/Objective:   Assessment and Plan:1.  Polycythemia vera.  73 year old male referred for evaluation due to elevated hematocrit and platelet count.  Pt denies any headaches, blurred vision, or neuropathy.  He reports he last smoked 20 years ago.  He quit drinking 20 years ago. He denies any blood in stool or urine.  He denies any new medications. He is not on iron.   Labs done 01/15/2019 showed WBC 12.4 HB 20.9 plts 727,000.  He has MCV of 75.  Chemistries showed K+ 5.3 Cr 1.53 and elevated LFTS with AST of 66 and ALT of 86.  Vitamin B12 elevated at 1279.  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  Pt is seen today for consultation due to elevated hematocrit and platelet count.    Labs done 02/12/2019 reviewed and showed WBC 10.9 HB 20 HCT 65 MCV 78 plts 592,000.  Chemistries showed K+ 4 Cr  1.46 and normal LFTs.  Sed rate 0.  Ferritin low normal at 31.  TS 13.  He has negative BCR/ABL and Hepatitis B and C.  EPO level is 2.6.  Jak 2 V 617F is positive.  Findings are consistent with James Vera and has the major criteria of HCT > 49 along with positive JAK 2 mutation and subnormal EPO level.  James Bryant is a myeloproliferative process. Most patients with PV are discovered incidentally when an elevated hemoglobin or hematocrit is noted on a complete blood count.    Pt would be considered high risk due to age.  I discussed with pt and daughter recommendations of Phlebotomy to decrease HCT to 45.  Pt should also continue low dose Aspirin.  I discussed option of hydrea 500 mg po bid and potential side effects of medication.  I discussed with pt if placed on hydrea he would need frequent lab monitoring of counts.  Treatment is not curative, but can relieve symptoms and prolong survival. The goals of care are to reduce the risk of first and/or recurrent thrombosis, prevent bleeding events, ameliorate the symptom burden, and minimize the risk of evolution to post-PV myelofibrosis (MF) and acute myeloid leukemia/myelodysplastic syndrome (AML/MDS).    Daughter is on phone and reports pt has severe phobia to needles and only wants natural therapy.  I discussed with her  these are standard recommendations based on diagnosis.    I have spoken with NP Raenette Rover today regarding diagnosis and recommendations and she will also speak with pt and daughter and notify the office if he will proceed with phlebotomy and/or hydrea.    Pt will be given follow-up in 05/2019 pending decision.    2.  Thrombocytosis.  Plt count is 592,000 on labs done today 02/12/2019.  Awaiting results of Jak2 and BCR/ABL and EPO.  Pt will have phone visit follow-up in 2 weeks to go over results.   3.  Elevated LFTs.  Labs done 01/15/2019 showed AST of 66 and ALT of 86.  Labs done today 02/12/2019 showed normal LFTS.  Awaiting results of hepatitis  testing.  Pt will have phone visit follow-up in 2 weeks.    4.  Microcytosis.  Ferritin low normal at 31.  Hemoglobin electrophoresis is WNL.  Will repeat labs in 05/2019.    5.  Joint pain.  Sed rate WNL at 0.  He has negative SPEP.  RF elevated at 21.  Follow-up with PCP if ongoing symptoms.    6.  Needle phobia.  Pt reassured about lab draw process and need for additional testing based on diagnosis.  Have spoken with Dr. Raenette Rover who will also speak with pt.    7.  Health maintenance.  Pt has not had colonoscopy.  Follow-up with PCP to discuss GI referral.     Follow Up Instructions: Labs in 05/2019 pending pt decision to proceed with recommended therapy.      I discussed the assessment and treatment plan with the patient. The patient was provided an opportunity to ask questions and all were answered. The patient agreed with the plan and demonstrated an understanding of the instructions.   The patient was advised to call back or seek an in-person evaluation if the symptoms worsen or if the condition fails to improve as anticipated.  I provided 25 minutes of non-face-to-face time during this encounter.   Zoila Shutter, MD

## 2019-02-26 NOTE — Telephone Encounter (Signed)
Will schedule once template is available °

## 2019-02-26 NOTE — Progress Notes (Signed)
   Subjective:  Documentation for virtual telephone encounter. Unable to use video.   The patient was located at home. The provider was located in the office. The patient did consent to this visit and is aware of possible charges through their insurance for this visit.  The other persons participating in this telemedicine service were his daughter.     Patient ID: James Bryant, male    DOB: [birth year], 73 y.o.   MRN: [id number]  HPI Chief Complaint  Patient presents with  . discuss blood work    discuss getting blood work- Education officer, environmental appt here   After receiving a call from Dr. Walden Field regarding patient's diagnosis of PV, I called and spoke with patient and his daughter to offer my advice and answer any questions regarding his recent diagnosis of polycythemia vera and recommended course of treatment from Dr. Walden Field.  Patient states he does not plan of having blood drawn but will consider taking hydroxyurea.   Unclear as to where his extreme fear of needles came from but this seems to be playing a detrimental role in his treatment.   States he has appt for echo in the next week.   Does not want to see GI right now for colonoscopy or for liver dysfunction.    Review of Systems Pertinent positives and negatives in the history of present illness.     Objective:   Physical Exam There were no vitals taken for this visit.  Alert and in no acute distress.       Assessment & Plan:  Polycythemia vera ([place])   Fear of needles   Discussed that his fear needles is unfortunate and keeping him from following through with the gold standard of treatment for his condition.  He plans to contact Dr. Walden Field and start on Hydroxyurea. Advised patient that he will need to have frequent lab follow up on this medication and that the medication comes with the potential for significant side effects or secondary illnesses. He would like to have labs drawn in our office for follow up if allowed  by Dr. Walden Field. I advised that this would be up to Dr. Walden Field.  Discussed that he is overdue for colonoscopy and that his liver function is abnormal. He declines referral to GI now. Will talk to him about this again at follow up appt.   Time spent on call was 10 minutes and in review of previous records 2 minutes total.  This virtual service is not related to other E/M service within previous 7 days.

## 2019-02-28 ENCOUNTER — Telehealth: Payer: Self-pay | Admitting: Oncology

## 2019-02-28 ENCOUNTER — Other Ambulatory Visit: Payer: Self-pay | Admitting: Internal Medicine

## 2019-02-28 DIAGNOSIS — D45 Polycythemia vera: Secondary | ICD-10-CM

## 2019-02-28 MED ORDER — HYDROXYUREA 500 MG PO CAPS: 500.0000 mg | ORAL_CAPSULE | Freq: Two times a day (BID) | ORAL | 1 refills | Status: DC

## 2019-02-28 MED FILL — HYDROXYUREA 500 MG CAPSULE: 500 | 30 days supply | Qty: 60 | Fill #0

## 2019-02-28 NOTE — Telephone Encounter (Signed)
Scheduled appt per 7/10 sch message - unable to reach pt . Left message with appt date  And time

## 2019-03-03 LAB — JAK 2 EXON 12 (GENPATH)

## 2019-03-03 LAB — BCR ABL1 FISH (GENPATH)

## 2019-03-03 LAB — JAK 2 V617F (GENPATH)

## 2019-03-05 ENCOUNTER — Ambulatory Visit (HOSPITAL_COMMUNITY): Payer: Medicare HMO | Attending: Cardiology

## 2019-03-05 ENCOUNTER — Other Ambulatory Visit: Payer: Self-pay

## 2019-03-05 DIAGNOSIS — R9431 Abnormal electrocardiogram [ECG] [EKG]: Secondary | ICD-10-CM

## 2019-03-05 DIAGNOSIS — I10 Essential (primary) hypertension: Secondary | ICD-10-CM | POA: Insufficient documentation

## 2019-03-05 DIAGNOSIS — R5383 Other fatigue: Secondary | ICD-10-CM | POA: Insufficient documentation

## 2019-03-06 ENCOUNTER — Encounter: Payer: Self-pay | Admitting: Physician Assistant

## 2019-03-07 ENCOUNTER — Telehealth: Payer: Self-pay | Admitting: Physician Assistant

## 2019-03-07 ENCOUNTER — Encounter: Payer: Self-pay | Admitting: Physician Assistant

## 2019-03-07 ENCOUNTER — Telehealth: Payer: Self-pay | Admitting: Cardiovascular Disease

## 2019-03-07 NOTE — Telephone Encounter (Signed)
Pt's daughter call stated pt does not need to come to 7/20 visit if it's just for results.  She would like to keep the 8/12 appt .Marland Kitchen  Please give her a call back on wether this is OK stated pt needs rest.

## 2019-03-07 NOTE — Telephone Encounter (Signed)
Spoke with the patient's daughter, per dpr, she wanted to adjust his appointments. She wanted a virtual visit and cancel the duplicate visit. I correct and scheduled on 03/12/19.

## 2019-03-07 NOTE — Telephone Encounter (Signed)
New Message ° ° ° °Left message to confirm appt and answer covid questions  °

## 2019-03-10 ENCOUNTER — Ambulatory Visit: Payer: Medicare HMO | Admitting: Physician Assistant

## 2019-03-11 NOTE — Progress Notes (Signed)
Virtual Visit via Telephone Note   This visit type was conducted due to national recommendations for restrictions regarding the COVID-19 Pandemic (e.g. social distancing) in an effort to limit this patient's exposure and mitigate transmission in our community.  Due to his co-morbid illnesses, this patient is at least at moderate risk for complications without adequate follow up.  This format is felt to be most appropriate for this patient at this time.  The patient did not have access to video technology/had technical difficulties with video requiring transitioning to audio format only (telephone).  All issues noted in this document were discussed and addressed.  No physical exam could be performed with this format.  Please refer to the patient's chart for his  consent to telehealth for [facility].   Date:  03/12/2019   ID:  James Bryant, James Bryant Oct 03, 1945, MRN [id number]  Patient Location: Home Provider Location: Home  PCP:  Girtha Rm, NP-C  Cardiologist: Dr. Acie Fredrickson  Evaluation Performed:  Follow-Up Visit  Chief Complaint:  One month follow up  History of Present Illness:    BRODAN GREWELL is a 73 y.o. male with hx of HTN, polycythemia and remote tobacco smoker seen for follow up.    Established care with Dr. Cathie Olden 01/2019 for HTN. Eating excess salt. Noted sinus tachycardia. Increase HCTZ and added coreg.   Echo by PCP 03/05/19 showed LVEF of 25-30%, diffuse hypokinesis and impaired relaxation.  Seen virtually for follow-up with daughter over the phone.  Blood pressure much better controlled in 130s over 70s.  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  He denies any exertional symptoms.  He has cut back on salt intake.  Denies chest pain, shortness of breath, palpitation, dizziness, orthopnea, PND, syncope or lower extremity edema.  The patient does not have symptoms concerning for COVID-19 infection (fever, chills, cough, or new  shortness of breath).    Past Medical History:  Diagnosis Date  . Elevated liver function tests 01/17/2019  . Elevated serum creatinine 01/17/2019  . Fear of needles 01/17/2019  . Former smoker, stopped smoking many years ago 01/17/2019  . Hypertension   . Polycythemia 01/17/2019  . Thrombocytosis ([place]) 01/17/2019   No past surgical history on file.   Current Meds  Medication Sig  . amLODipine (NORVASC) 5 MG tablet Take 1 tablet (5 mg total) by mouth daily.  Marland Kitchen aspirin EC 81 MG tablet Take 81 mg by mouth daily.  . carvedilol (COREG) 6.25 MG tablet Take 1 tablet (6.25 mg total) by mouth 2 (two) times daily.  . hydrochlorothiazide (HYDRODIURIL) 25 MG tablet Take 1 tablet (25 mg total) by mouth daily.  . hydroxyurea (HYDREA) 500 MG capsule Take 1 capsule (500 mg total) by mouth 2 (two) times daily. May take with food to minimize GI side effects.  . latanoprost (XALATAN) 0.005 % ophthalmic solution Place 1 drop into both eyes 2 (two) times a day.     Allergies:   Patient has no known allergies.   Social History   Tobacco Use  . Smoking status: Former Research scientist (life sciences)  . Smokeless tobacco: Never Used  Substance Use Topics  . Alcohol use: No    Comment: stopped many years ago   . Drug use: Never     Family Hx: The patient's family history includes Heart disease in his mother; Hypertension in his mother.  ROS:   Please see the history of present illness.    All other systems  reviewed and are negative.   Prior CV studies:   The following studies were reviewed today:  Echo 02/2019 IMPRESSIONS    1. The left ventricle has severely reduced systolic function, with an ejection fraction of 25-30%. The cavity size was normal. Left ventricular diastolic Doppler parameters are consistent with impaired relaxation. Left ventricular diffuse hypokinesis.  2. The right ventricle has normal systolic function. The cavity was mildly enlarged. There is no increase in right ventricular wall thickness.   3. Left atrial size was mildly dilated.  4. Right atrial size was mildly dilated.  Labs/Other Tests and Data Reviewed:    EKG:  No ECG reviewed.  Recent Labs: 01/15/2019: TSH 2.120 02/12/2019: ALT 33; BUN 20; Creatinine 1.46; Hemoglobin 20.1; Platelet Count 592; Potassium 4.0; Sodium 139   Recent Lipid Panel Lab Results  Component Value Date/Time   CHOL 218 (H) 01/15/2019 12:32 PM   TRIG 87 01/15/2019 12:32 PM   HDL 62 01/15/2019 12:32 PM   CHOLHDL 3.5 01/15/2019 12:32 PM   LDLCALC 139 (H) 01/15/2019 12:32 PM    Wt Readings from Last 3 Encounters:  03/12/19 165 lb (74.8 kg)  02/12/19 168 lb 12.8 oz (76.6 kg)  01/15/19 168 lb 9.6 oz (76.5 kg)     Objective:    Vital Signs:  BP 133/80   Pulse 67   Ht 5\' 7"  (1.702 m)   Wt 165 lb (74.8 kg)   BMI 25.84 kg/m    VITAL SIGNS:  reviewed GEN:  no acute distress NEURO:  alert and oriented x 3, no obvious focal deficit PSYCH:  normal affect  ASSESSMENT & PLAN:    1. Chronic combined CHF Echo 03/05/19 showed LVEF of 25-30%, diffuse hypokinesis and impaired relaxation.  Euvolemic.  Eating low-sodium diet.  Continue Coreg and hydrochlorothiazide at current dose.  Will add losartan.  I have discussed ischemic evaluation at length with daughter.  Will get stress test.  Patient is needle phobic.  Will call us if needed relaxing medication for stress test. Suspect hypertensive cardiomyopathy.  2. HTN -Blood pressure controlled on current medication.  Add losartan as above.  Will plan to uptitrate heart failure therapy during follow-up with reduction in amlodipine dose.  COVID-19 Education: The signs and symptoms of COVID-19 were discussed with the patient and how to seek care for testing (follow up with PCP or arrange E-visit).  The importance of social distancing was discussed today.  Time:   Today, I have spent 22 minutes with the patient with telehealth technology discussing the above problems.     Medication Adjustments/Labs  and Tests Ordered: Current medicines are reviewed at length with the patient today.  Concerns regarding medicines are outlined above.   Tests Ordered: No orders of the defined types were placed in this encounter.   Medication Changes: No orders of the defined types were placed in this encounter.   Follow Up:  In Person in 4 week(s)  Signed, Leanor Kail, [place]  03/12/2019 10:24 AM    [facility]

## 2019-03-12 ENCOUNTER — Encounter: Payer: Self-pay | Admitting: Physician Assistant

## 2019-03-12 ENCOUNTER — Telehealth (INDEPENDENT_AMBULATORY_CARE_PROVIDER_SITE_OTHER): Payer: Medicare HMO | Admitting: Physician Assistant

## 2019-03-12 ENCOUNTER — Telehealth: Payer: Self-pay

## 2019-03-12 ENCOUNTER — Other Ambulatory Visit: Payer: Self-pay

## 2019-03-12 VITALS — BP 133/80 | HR 67 | Ht 67.0 in | Wt 165.0 lb

## 2019-03-12 DIAGNOSIS — I5042 Chronic combined systolic (congestive) and diastolic (congestive) heart failure: Secondary | ICD-10-CM | POA: Diagnosis not present

## 2019-03-12 DIAGNOSIS — Z7189 Other specified counseling: Secondary | ICD-10-CM

## 2019-03-12 DIAGNOSIS — I10 Essential (primary) hypertension: Secondary | ICD-10-CM

## 2019-03-12 DIAGNOSIS — I110 Hypertensive heart disease with heart failure: Secondary | ICD-10-CM

## 2019-03-12 MED ORDER — LOSARTAN POTASSIUM 25 MG PO TABS: 25.0000 mg | ORAL_TABLET | Freq: Every day | ORAL | 3 refills | Status: DC

## 2019-03-12 NOTE — Telephone Encounter (Signed)

## 2019-03-12 NOTE — Patient Instructions (Signed)
Medication Instructions:  Your physician has recommended you make the following change in your medication:   1. START LOSARTAN 25 MG DAILY.  If you need a refill on your cardiac medications before your next appointment, please call your pharmacy.   Lab work: TO BE DONE IN 1 WEEK: BMET If you have labs (blood work) drawn today and your tests are completely normal, you will receive your results only by: Marland Kitchen MyChart Message (if you have MyChart) OR . A paper copy in the mail If you have any lab test that is abnormal or we need to change your treatment, we will call you to review the results.  Testing/Procedures: Your physician has requested that you have a lexiscan myoview. For further information please visit [email or web address]. Please follow instruction sheet, as given.  Follow-Up: . Your physician recommends that you schedule a follow-up appointment WITH VIN BHAGAT, PA AFTER LEXISCAN MYOVIEW IS COMPLETED.   Any Other Special Instructions Will Be Listed Below (If Applicable).

## 2019-03-13 ENCOUNTER — Telehealth: Payer: Self-pay | Admitting: Internal Medicine

## 2019-03-13 NOTE — Telephone Encounter (Signed)
Returned call to patient re message received to cancel appointment 7/29. Per dtr not rescheduling at this time, will have lab work from cardiology visit forwarded to [facility].

## 2019-03-14 ENCOUNTER — Telehealth: Payer: Self-pay | Admitting: *Deleted

## 2019-03-14 NOTE — Telephone Encounter (Signed)
TCT patient on home # and mobile #. No answer on either phone. Left vm message to call back @ [phone] and ask for Dr. Walden Field nurse. Calling to see if patient has started taking his Hydroxyurea and to schedule lab appt here @ [place]. Pt's daughter , Sharlett Iles,  has same cell # as pt Daughter had left a message yesterday, cancelling pt's lab appt on 03/19/19 because he would get labs @ his cardiologist but she did not say when that would be. We need to confirm pt is or is not taking hydroxyurea.

## 2019-03-19 ENCOUNTER — Other Ambulatory Visit: Payer: Medicare HMO

## 2019-03-20 ENCOUNTER — Telehealth: Payer: Self-pay | Admitting: Physician Assistant

## 2019-03-20 MED ORDER — ALPRAZOLAM 0.5 MG PO TABS: ORAL_TABLET | ORAL | 0 refills | Status: DC

## 2019-03-20 NOTE — Telephone Encounter (Signed)
New message      Patient is scheduled for a nuclear stress test on 04-03-19 and a follow up visit on 04-10-19.  Daughter says patient is "fearful" of needles.  Daughter, Sharlett Iles, want to know if pt can have a presc for xanax to take prior to stress test.  She said they talked about this at his last visit with Vin.  If yes, please call presc to [facility] at [address] and let daughter know.

## 2019-03-20 NOTE — Telephone Encounter (Signed)
Left daughter message that the prescription for her dad to have for his stress test has been sent to  [facility].

## 2019-03-21 ENCOUNTER — Telehealth: Payer: Self-pay | Admitting: *Deleted

## 2019-03-21 NOTE — Telephone Encounter (Signed)
TCT patient. Pt's daughter answered the phone and is able to speak for patient.  Main question is whether patient has started the Hydroxyurea that had been prescribed 02/28/19.  She states he has not picked it up from [facility]. She and patient thought it was a mail order medication. Discussed the need for the medication and subsequent lab monitoring.  Daughter states her father has an extraordinary fear of needles, lab draws and she is trying to consolidate his MD appts/lab appts. She will discuss again with her dad about the medication and needed follow up. She states she will call back next week for their decision on moving forward.

## 2019-03-25 ENCOUNTER — Telehealth: Payer: Self-pay | Admitting: *Deleted

## 2019-03-25 ENCOUNTER — Telehealth: Payer: Self-pay | Admitting: Physician Assistant

## 2019-03-25 NOTE — Telephone Encounter (Signed)
Returned pts daughter, Jeral Fruit, [place] on file, call.  She is wanting to have pt's stress test / lab work done on the same day but pt just got the Losartan in the mail and is just starting. Daughter wants to reschedule stress test until week of 8/17, if possible, that way he can get lab work done on the same day. Daughter advised that there is no labs ordered for pt yet, that normally are ordered at follow-up appt. Due to pt's stressors over needles, will route to [facility], PA-C, to see if he is willing to go ahead and order the labs he was going to check on appt.  Will send Neil Crouch a message to cancel stress test and to call daughter back to reschedule.  Daughter thanked me for all the help/information.

## 2019-03-25 NOTE — Telephone Encounter (Signed)
Patient's daughter has a few questions about the new medication and lab work that has to be done.

## 2019-03-25 NOTE — Telephone Encounter (Signed)
Received vm message from pt's daughter, Dustine Stickler. Requesting call back. Attempted call back. No answer. No message left as phone 3 was not identified as hers. Will try to call back on 03/26/19

## 2019-03-27 NOTE — Telephone Encounter (Signed)
Virl Axe, daughter, DPR on file. She has been made aware that I spoke with [name] in [place] and Lanny Hurst in our lab and they both have agreed for Nuc Med to draws pt's lab at the time they are going to do the [place] so pt will only have to be stuck 1 time.  Daughter very appreciative for [place] working this out on her father's behalf.

## 2019-04-01 ENCOUNTER — Telehealth (HOSPITAL_COMMUNITY): Payer: Self-pay

## 2019-04-01 NOTE — Telephone Encounter (Signed)
Spoke with the patient's wife. She stated he would be here for his test. Asked to call back with any questions. S.Demari Gales EMTP

## 2019-04-02 ENCOUNTER — Ambulatory Visit: Payer: Medicare HMO | Admitting: Physician Assistant

## 2019-04-03 ENCOUNTER — Other Ambulatory Visit: Payer: Medicare HMO

## 2019-04-03 ENCOUNTER — Encounter (HOSPITAL_COMMUNITY): Payer: Medicare HMO

## 2019-04-08 DIAGNOSIS — H401132 Primary open-angle glaucoma, bilateral, moderate stage: Secondary | ICD-10-CM | POA: Diagnosis not present

## 2019-04-09 ENCOUNTER — Other Ambulatory Visit: Payer: Medicare HMO

## 2019-04-09 ENCOUNTER — Encounter (HOSPITAL_COMMUNITY): Payer: Self-pay

## 2019-04-09 ENCOUNTER — Ambulatory Visit (HOSPITAL_COMMUNITY): Payer: Medicare HMO

## 2019-04-09 ENCOUNTER — Other Ambulatory Visit: Payer: Self-pay

## 2019-04-09 ENCOUNTER — Telehealth: Payer: Self-pay | Admitting: *Deleted

## 2019-04-09 NOTE — Telephone Encounter (Signed)
Pt came in for [place] today along with BMET (Nuc Med was going to draw since pt is terrified of needles) When the technician got the pt to the room, pt declined any "needles".   This had been very thoroughly arranged with pt's daughter, Sharlett Iles.  Pt was prescribed a Xanax 0.5 m gto take 30 mins prior to him coming.  Pt's daughter stated that pt was very calm on the way over, and she thought he would go through with the test. She advised that she wasn't sure what else we could do, that he just wouldn't do it. She had mentioned if there was any testing that could be done with sedation? I advised her the providers would have to decide and someone would reach out to her, but in the meantime, if she could talk her dad into proceeding with the [place], to call me and we would reschedule.  She was very grateful.

## 2019-04-09 NOTE — Telephone Encounter (Signed)
He should consult with his primary MD to prescribe him anxiety meds ( either scheduled or as needed for a test involving needles. )  Reschedule when he is willing to do the [facility]

## 2019-04-10 ENCOUNTER — Encounter (HOSPITAL_COMMUNITY): Payer: Medicare HMO

## 2019-04-10 ENCOUNTER — Other Ambulatory Visit: Payer: Medicare HMO

## 2019-04-10 ENCOUNTER — Ambulatory Visit: Payer: Medicare HMO | Admitting: Physician Assistant

## 2019-06-13 ENCOUNTER — Telehealth: Payer: Self-pay | Admitting: Hematology and Oncology

## 2019-06-13 NOTE — Telephone Encounter (Signed)
[place] transfer to [place]. Not able to reach patient re setting up follow up. Called secondary number and spoke with dtr Shavonndra. Per dtr patient will not be returning to hematologists office - declined appointment.

## 2019-07-14 ENCOUNTER — Other Ambulatory Visit: Payer: Self-pay | Admitting: Family Medicine

## 2019-07-14 DIAGNOSIS — I10 Essential (primary) hypertension: Secondary | ICD-10-CM

## 2019-08-25 DIAGNOSIS — H401132 Primary open-angle glaucoma, bilateral, moderate stage: Secondary | ICD-10-CM | POA: Diagnosis not present

## 2019-09-19 ENCOUNTER — Other Ambulatory Visit: Payer: Self-pay | Admitting: Family Medicine

## 2019-09-19 DIAGNOSIS — I10 Essential (primary) hypertension: Secondary | ICD-10-CM

## 2019-11-04 ENCOUNTER — Telehealth: Payer: Self-pay | Admitting: Internal Medicine

## 2019-11-04 NOTE — Telephone Encounter (Signed)
Left message for pt to call back to schedule AWV. 

## 2019-11-30 ENCOUNTER — Other Ambulatory Visit: Payer: Self-pay | Admitting: Family Medicine

## 2019-11-30 DIAGNOSIS — I10 Essential (primary) hypertension: Secondary | ICD-10-CM

## 2019-12-01 NOTE — Telephone Encounter (Signed)
Left message for pt to call back to schedule an appt 

## 2019-12-02 NOTE — Telephone Encounter (Signed)
Pt has an appt.

## 2020-01-01 ENCOUNTER — Ambulatory Visit: Payer: Medicare HMO | Admitting: Family Medicine

## 2020-01-06 DIAGNOSIS — H401132 Primary open-angle glaucoma, bilateral, moderate stage: Secondary | ICD-10-CM | POA: Diagnosis not present

## 2020-01-07 DIAGNOSIS — E559 Vitamin D deficiency, unspecified: Secondary | ICD-10-CM | POA: Insufficient documentation

## 2020-01-07 NOTE — Progress Notes (Signed)
James Bryant is a 74 y.o. male who presents for annual wellness visit, CPE and follow-up on chronic medical conditions.  He has the following concerns:  Severely afraid of needles and is not willing to have labs done today. States he did not follow up with his cardiologist or hematologist because of this fear.   Reports feeling good and no concerns. His wife is with him today.   HTN- he brought in his readings from home and he had several readings higher than goal range.  Reports good daily compliance with his medications including amlodipine 5 mg, HCTZ 25 mg, losartan 25 mg, and carvedilol 6.25 mg. HTN medications were adjusted by his cardiologist.  Denies any side effects.   Chronic combined CHF- Echo 03/05/2019 showed LVEF of 25-30%.   PV- states he is taking Hydrea daily. Does not have a follow up scheduled with his hematologist.   Denies fever, chills, night sweats, headache, dizziness, chest pain, palpitations, shortness of breath, abdominal pain, N/V/D, urinary symptoms, LE edema.   History of elevated serum creatinine and vitamin D deficiency.    Stopped smoking and drinking alcohol more than 20 years ago.     There is no immunization history on file for this patient. Last colonoscopy: never Last PSA: never Dentist: none Ophtho: [facility] Exercise: lifts weighs  Other doctors caring for patient include: Cardiologist- Dr. Cathie Olden  Hematologist- Dr. Walden Field and ?transfer to Dr. Dorsey  [facility]    Depression screen:  See questionnaire below.     Depression screen PHQ 2/9 01/08/2020  Decreased Interest 0  Down, Depressed, Hopeless 0  PHQ - 2 Score 0    Fall Screen: See Questionaire below.   Fall Risk  01/08/2020  Falls in the past year? 0  Number falls in past yr: 0    ADL screen:  See questionnaire below.  Functional Status Survey: Is the patient deaf or have difficulty hearing?: No Does the patient have difficulty seeing, even  when wearing glasses/contacts?: No Does the patient have difficulty concentrating, remembering, or making decisions?: No Does the patient have difficulty walking or climbing stairs?: No Does the patient have difficulty dressing or bathing?: No Does the patient have difficulty doing errands alone such as visiting a doctor's office or shopping?: No   End of Life Discussion:  Patient has a living will and medical power of attorney and will bring these by our office to copy.    Review of Systems  Constitutional: -fever, -chills, -sweats, -unexpected weight change, -anorexia, -fatigue Allergy: -sneezing, -itching, -congestion Dermatology: denies changing moles, rash, lumps, new worrisome lesions ENT: -runny nose, -ear pain, -sore throat, -hoarseness, -sinus pain, -teeth pain, -tinnitus, -hearing loss, -epistaxis Cardiology:  -chest pain, -palpitations, -edema, -orthopnea, -paroxysmal nocturnal dyspnea Respiratory: -cough, -shortness of breath, -dyspnea on exertion, -wheezing, -hemoptysis Gastroenterology: -abdominal pain, -nausea, -vomiting, -diarrhea, -constipation, -blood in stool, -changes in bowel movement, -dysphagia Hematology: -bleeding or bruising problems Musculoskeletal: -arthralgias, -myalgias, -joint swelling, -back pain, -neck pain, -cramping, -gait changes Ophthalmology: -vision changes, -eye redness, -itching, -discharge Urology: -dysuria, -difficulty urinating, -hematuria, -urinary frequency, -urgency,- incontinence Neurology: -headache, -weakness, -tingling, -numbness, -speech abnormality, -memory loss, -falls, -dizziness Psychology:  -depressed mood, -agitation, -sleep problems   PHYSICAL EXAM:  BP 120/80   Pulse 74   Ht 5' 5.75" (1.67 m)   Wt 170 lb 6.4 oz (77.3 kg)   BMI 27.71 kg/m   General Appearance: Alert, cooperative, no distress, appears stated age Head: Normocephalic, without obvious abnormality,  atraumatic Eyes: PERRL, conjunctiva/corneas clear, EOM's  intact Ears: Normal TM's and external ear canals Nose: mask in place  Throat: mask in place  Neck: Supple, no lymphadenopathy, thyroid:no enlargement/tenderness/nodules Back: Spine nontender, no curvature, ROM normal, no CVA tenderness Lungs: Clear to auscultation bilaterally without wheezes, rales or ronchi; respirations unlabored Chest Wall: No tenderness or deformity Heart: Regular rate and rhythm Breast Exam: No chest wall tenderness, masses or gynecomastia Abdomen: Soft, non-tender, nondistended, normoactive bowel sounds, no masses, no hepatosplenomegaly Genitalia: refuses  Rectal: refuses  Extremities: No clubbing, cyanosis or edema Pulses: 2+ and symmetric all extremities Skin: Skin color, texture, turgor normal, no rashes or lesions Lymph nodes: Cervical, supraclavicular, and axillary nodes normal Neurologic: CNII-XII intact, normal strength, sensation and gait Psych: Normal mood, affect, hygiene and grooming  ASSESSMENT/PLAN: Medicare annual wellness visit, initial -Here today for Medicare wellness visit.  He denies any concerns with ADLs, falls, depression or memory concerns.  Advanced directives discussed and his wife states they have these at home.  They will bring them by for [place] to make a copy.  Routine general medical examination at a health care facility -Reviewed preventive health care which does show several deficiencies.  He declines PSA or prostate exam.  Cologuard ordered.  Regular dental care.  Counseling on healthy diet and exercise.  Uncontrolled hypertension -Blood pressure in goal range today.  Reviewed readings from home which showed several readings out of goal range.  He will bring in his blood pressure cuff for comparison.  Can do this with a nurse visit.  Encouraged healthy diet including low sodium.  He will call and schedule a follow-up visit with his cardiologist.  Polycythemia vera ([place]) -Managed by hematology.  Taking hydroxyurea without any concerns  per patient and wife.  Vitamin D deficiency -He is not currently taking a vitamin D supplement.  Discussed that I would like to check his vitamin D level.  Declines labs.  Elevated serum creatinine -Discussed that he does have chronic kidney disease that we need to monitor.  Declines labs.  Discussed importance of good blood pressure control.  Screen for colon cancer - Plan: Cologuard -Refuses colonoscopy.  He does agree to doing the Cologuard.  Discussed that if this is positive that he will need to have a colonoscopy.  Immunization counseling -He refuses to have immunizations due to fear of needles.  Aware that he is overdue.  I also strongly encouraged him to get a Covid vaccine.  Severe needle phobia -Unfortunately his needle phobia is interfering with his care.  States he will consider having labs done here again.  Would like for me to check labs needed by his hematologist.  Primary open angle glaucoma, unspecified glaucoma stage, unspecified laterality  Personal history of noncompliance with medical treatment, presenting hazards to health   Discussed PSA screening (risks/benefits), recommended at least 30 minutes of aerobic activity at least 5 days/week; proper sunscreen use reviewed; healthy diet and alcohol recommendations (less than or equal to 2 drinks/day) reviewed; regular seatbelt use; changing batteries in smoke detectors. Immunization recommendations discussed.  Colonoscopy recommendations reviewed.   Medicare Attestation I have personally reviewed: The patient's medical and social history Their use of alcohol, tobacco or illicit drugs Their current medications and supplements The patient's functional ability including ADLs,fall risks, home safety risks, cognitive, and hearing and visual impairment Diet and physical activities Evidence for depression or mood disorders  The patient's weight, height, and BMI have been recorded in the chart.  I have made referrals,  counseling, and provided education to the patient based on review of the above and I have provided the patient with a written personalized care plan for preventive services.     Harland Dingwall, NP-C   01/08/2020

## 2020-01-08 ENCOUNTER — Other Ambulatory Visit: Payer: Self-pay

## 2020-01-08 ENCOUNTER — Encounter: Payer: Self-pay | Admitting: Family Medicine

## 2020-01-08 ENCOUNTER — Ambulatory Visit (INDEPENDENT_AMBULATORY_CARE_PROVIDER_SITE_OTHER): Payer: Medicare HMO | Admitting: Family Medicine

## 2020-01-08 VITALS — BP 120/80 | HR 74 | Ht 65.75 in | Wt 170.4 lb

## 2020-01-08 DIAGNOSIS — D45 Polycythemia vera: Secondary | ICD-10-CM

## 2020-01-08 DIAGNOSIS — Z9119 Patient's noncompliance with other medical treatment and regimen: Secondary | ICD-10-CM | POA: Diagnosis not present

## 2020-01-08 DIAGNOSIS — F40231 Fear of injections and transfusions: Secondary | ICD-10-CM | POA: Diagnosis not present

## 2020-01-08 DIAGNOSIS — Z91199 Patient's noncompliance with other medical treatment and regimen due to unspecified reason: Secondary | ICD-10-CM

## 2020-01-08 DIAGNOSIS — H401190 Primary open-angle glaucoma, unspecified eye, stage unspecified: Secondary | ICD-10-CM

## 2020-01-08 DIAGNOSIS — R7989 Other specified abnormal findings of blood chemistry: Secondary | ICD-10-CM

## 2020-01-08 DIAGNOSIS — Z1211 Encounter for screening for malignant neoplasm of colon: Secondary | ICD-10-CM | POA: Diagnosis not present

## 2020-01-08 DIAGNOSIS — Z7189 Other specified counseling: Secondary | ICD-10-CM | POA: Diagnosis not present

## 2020-01-08 DIAGNOSIS — E559 Vitamin D deficiency, unspecified: Secondary | ICD-10-CM

## 2020-01-08 DIAGNOSIS — I10 Essential (primary) hypertension: Secondary | ICD-10-CM

## 2020-01-08 DIAGNOSIS — Z0000 Encounter for general adult medical examination without abnormal findings: Secondary | ICD-10-CM | POA: Diagnosis not present

## 2020-01-08 DIAGNOSIS — Z7185 Encounter for immunization safety counseling: Secondary | ICD-10-CM

## 2020-01-08 NOTE — Patient Instructions (Signed)
  [name] , Thank you for taking time to come for your Medicare Wellness Visit. I appreciate your ongoing commitment to your health goals. Please review the following plan we discussed and let me know if I can assist you in the future.   These are the goals we discussed:  Call and schedule with your hematologist (blood specialist). You can ask them to send over the labs they need and we can have you come in here for the blood work.   Follow up with your cardiologist also and if they need labs you can do the same.   Bring in your blood pressure cuff for [place] to compare with ours.   Bring in your advance directives as well.    This is a list of the screening recommended for you and due dates:  Health Maintenance  Topic Date Due  . COVID-19 Vaccine (1) Never done  . Tetanus Vaccine  Never done  . Colon Cancer Screening  Never done  . Pneumonia vaccines (1 of 2 - PCV13) Never done  . Flu Shot  03/21/2020  .  Hepatitis C: One time screening is recommended by Center for Disease Control  (CDC) for  adults born from [birth year] through 1967.   Completed

## 2020-01-12 ENCOUNTER — Encounter: Payer: Self-pay | Admitting: Family Medicine

## 2020-01-15 ENCOUNTER — Encounter: Payer: Self-pay | Admitting: Family Medicine

## 2020-01-15 ENCOUNTER — Encounter: Payer: Self-pay | Admitting: Internal Medicine

## 2020-01-15 ENCOUNTER — Telehealth (INDEPENDENT_AMBULATORY_CARE_PROVIDER_SITE_OTHER): Payer: Medicare HMO | Admitting: Family Medicine

## 2020-01-15 ENCOUNTER — Other Ambulatory Visit: Payer: Self-pay

## 2020-01-15 DIAGNOSIS — I5042 Chronic combined systolic (congestive) and diastolic (congestive) heart failure: Secondary | ICD-10-CM

## 2020-01-15 DIAGNOSIS — R7989 Other specified abnormal findings of blood chemistry: Secondary | ICD-10-CM

## 2020-01-15 DIAGNOSIS — F40231 Fear of injections and transfusions: Secondary | ICD-10-CM

## 2020-01-15 DIAGNOSIS — D45 Polycythemia vera: Secondary | ICD-10-CM

## 2020-01-15 DIAGNOSIS — I10 Essential (primary) hypertension: Secondary | ICD-10-CM

## 2020-01-15 NOTE — Progress Notes (Signed)
   Subjective:  Documentation for virtual audio and video telecommunications through [place] encounter:  The patient was located at home. 2 patient identifiers used.  The provider was located in the office. The patient did consent to this visit and is aware of possible charges through their insurance for this visit.  The other persons participating in this telemedicine service were his daughter.   Time spent on call was 8 minutes and in review of previous records 10 minutes total.  This virtual service is not related to other E/M service within previous 7 days.   Patient ID: James Bryant, male    DOB: 17-Feb-1946, 74 y.o.   MRN: [id number]  HPI No chief complaint on file.  This call was done during Epic downtime.  James Bryant his daughter has questions regarding her father's health. He filled out a HIPAA form earlier and is fine with me talking to her.  His daughter states he is unwilling to follow up with his cardiologist and hematologist because of his fear of needles.   Discussed that he has PV and is currently being treated with hydroxyurea. Also discussed he has chronic HF. LVEF of 25-30%.    Review of Systems Pertinent positives and negatives in the history of present illness.     Objective:   Physical Exam There were no vitals taken for this visit.        Assessment & Plan:  Severe needle phobia  Polycythemia vera ([name])  Uncontrolled hypertension  Elevated serum creatinine  Chronic combined systolic and diastolic congestive heart failure ([name])  Discussed patient's health with his daughter per his request. Advised that I am willing to let him get all the labs here in my office including the labs his hematologist and cardiologist are requesting. Discussed the difficulty involved in caring for patient's with chronic health conditions requiring medications if they will not have blood work done. She is aware. Answered all of her questions. James Bryant had  no questions.

## 2020-01-26 DIAGNOSIS — Z1211 Encounter for screening for malignant neoplasm of colon: Secondary | ICD-10-CM | POA: Diagnosis not present

## 2020-01-26 LAB — COLOGUARD: Cologuard: NEGATIVE

## 2020-01-29 ENCOUNTER — Other Ambulatory Visit: Payer: Self-pay | Admitting: Cardiovascular Disease

## 2020-01-29 ENCOUNTER — Other Ambulatory Visit: Payer: Self-pay | Admitting: Physician Assistant

## 2020-01-29 ENCOUNTER — Encounter: Payer: Self-pay | Admitting: Internal Medicine

## 2020-02-11 ENCOUNTER — Other Ambulatory Visit: Payer: Medicare HMO

## 2020-02-16 ENCOUNTER — Encounter: Payer: Self-pay | Admitting: Family Medicine

## 2020-03-03 ENCOUNTER — Other Ambulatory Visit: Payer: Self-pay | Admitting: Family Medicine

## 2020-03-03 DIAGNOSIS — I10 Essential (primary) hypertension: Secondary | ICD-10-CM

## 2020-03-09 DIAGNOSIS — H401132 Primary open-angle glaucoma, bilateral, moderate stage: Secondary | ICD-10-CM | POA: Diagnosis not present

## 2020-03-18 NOTE — Telephone Encounter (Signed)
ERROR

## 2020-04-27 ENCOUNTER — Telehealth: Payer: Self-pay | Admitting: Family Medicine

## 2020-04-27 NOTE — Telephone Encounter (Signed)
Recv'd fax from [facility] pt needs refills Amlodipine 5 mg & Brimonidine 0.2% eye drops

## 2020-04-27 NOTE — Telephone Encounter (Signed)
He will need to get the eye drops from his eye doctor since I cannot treat glaucoma. He should be aware of this. He needs labs before I can refill his blood pressure medication. No labs since 01/2019. He should also be aware of this. I have had this conversation with him and his family in the past.

## 2020-04-28 NOTE — Telephone Encounter (Signed)
Daughter was notified that we can not refill eye drops and she said that he has been getting this from his eye doctor so not sure why it came to [place]. Also daughter said not to refill meds and she is trying to tell her dad the importance of getting blood work and when he realize he doesn't have his bp med then maybe he will come in for labs even though he is terrified of needles

## 2020-05-04 ENCOUNTER — Telehealth: Payer: Self-pay

## 2020-05-04 NOTE — Telephone Encounter (Signed)
Pt's daughter was advised on 9/7 that we could not refill any bp meds until he came in for an appt and needed labs. And she said not to fill any that if her dad ran it he might would understand the importance of needing an appt.   Please deny

## 2020-05-04 NOTE — Telephone Encounter (Signed)
Faxed form back denying medication.

## 2020-05-04 NOTE — Telephone Encounter (Signed)
Received a fax from [facility] for a refill on the pts. Losartan, Carvedilol, and Hctz pt. Last apt. Was 01/15/20

## 2020-06-17 ENCOUNTER — Telehealth: Payer: Self-pay | Admitting: Family Medicine

## 2020-06-17 NOTE — Telephone Encounter (Signed)
Called pt reached voice mail lmtrc.  Pt needs med check with fasting labs.  THN report shows med adherence with Losartan 25 mg tab.  Last filled 01/30/20 90 days.  Sending my chart message also.

## 2020-07-28 DIAGNOSIS — H401132 Primary open-angle glaucoma, bilateral, moderate stage: Secondary | ICD-10-CM | POA: Diagnosis not present

## 2020-08-10 ENCOUNTER — Telehealth: Payer: Self-pay

## 2020-08-10 NOTE — Telephone Encounter (Signed)
Recv'd fax from [facility] pt needs refill on Carvedilol, Losartan, HCTZ

## 2020-08-10 NOTE — Telephone Encounter (Signed)
Left detailed message on pt's vm that he needs an appointment before we can refill meds. Please deny meds

## 2020-08-12 NOTE — Telephone Encounter (Signed)
done

## 2020-10-12 ENCOUNTER — Other Ambulatory Visit: Payer: Self-pay | Admitting: Family Medicine

## 2020-10-12 DIAGNOSIS — I10 Essential (primary) hypertension: Secondary | ICD-10-CM

## 2020-10-12 NOTE — Telephone Encounter (Signed)
Left detailed message for pt to call back to schedule an appt as he is overdue. Will deny med

## 2021-01-03 DIAGNOSIS — H401132 Primary open-angle glaucoma, bilateral, moderate stage: Secondary | ICD-10-CM | POA: Diagnosis not present

## 2021-02-28 ENCOUNTER — Ambulatory Visit (INDEPENDENT_AMBULATORY_CARE_PROVIDER_SITE_OTHER): Payer: Medicare HMO | Admitting: Medical

## 2021-02-28 ENCOUNTER — Other Ambulatory Visit: Payer: Self-pay | Admitting: Medical

## 2021-02-28 ENCOUNTER — Other Ambulatory Visit: Payer: Self-pay

## 2021-02-28 VITALS — BP 150/100 | HR 105 | Wt 171.6 lb

## 2021-02-28 DIAGNOSIS — F40231 Fear of injections and transfusions: Secondary | ICD-10-CM

## 2021-02-28 DIAGNOSIS — R918 Other nonspecific abnormal finding of lung field: Secondary | ICD-10-CM | POA: Diagnosis not present

## 2021-02-28 DIAGNOSIS — N184 Chronic kidney disease, stage 4 (severe): Secondary | ICD-10-CM | POA: Insufficient documentation

## 2021-02-28 DIAGNOSIS — Z87891 Personal history of nicotine dependence: Secondary | ICD-10-CM | POA: Diagnosis not present

## 2021-02-28 DIAGNOSIS — R7989 Other specified abnormal findings of blood chemistry: Secondary | ICD-10-CM

## 2021-02-28 DIAGNOSIS — D75839 Thrombocytosis, unspecified: Secondary | ICD-10-CM

## 2021-02-28 DIAGNOSIS — D45 Polycythemia vera: Secondary | ICD-10-CM

## 2021-02-28 DIAGNOSIS — E559 Vitamin D deficiency, unspecified: Secondary | ICD-10-CM | POA: Diagnosis not present

## 2021-02-28 DIAGNOSIS — N1831 Chronic kidney disease, stage 3a: Secondary | ICD-10-CM | POA: Diagnosis not present

## 2021-02-28 DIAGNOSIS — I10 Essential (primary) hypertension: Secondary | ICD-10-CM

## 2021-02-28 MED ORDER — HYDROXYUREA 500 MG PO CAPS: 500.0000 mg | ORAL_CAPSULE | Freq: Two times a day (BID) | ORAL | 0 refills | Status: DC

## 2021-02-28 MED ORDER — ASPIRIN EC 81 MG PO TBEC: 81.0000 mg | DELAYED_RELEASE_TABLET | Freq: Every day | ORAL | 3 refills | Status: DC

## 2021-02-28 MED ORDER — HYDROXYUREA 500 MG PO CAPS: 500.0000 mg | ORAL_CAPSULE | Freq: Two times a day (BID) | ORAL | 3 refills | Status: DC

## 2021-02-28 MED ORDER — VALSARTAN-HYDROCHLOROTHIAZIDE 160-12.5 MG PO TABS: 1.0000 | ORAL_TABLET | Freq: Every day | ORAL | 0 refills | Status: DC

## 2021-02-28 MED ORDER — VALSARTAN-HYDROCHLOROTHIAZIDE 160-12.5 MG PO TABS: 1.0000 | ORAL_TABLET | Freq: Every day | ORAL | 3 refills | Status: DC

## 2021-02-28 MED ORDER — CARVEDILOL 6.25 MG PO TABS: 6.2500 mg | ORAL_TABLET | Freq: Two times a day (BID) | ORAL | 3 refills | Status: DC

## 2021-02-28 MED ORDER — CARVEDILOL 6.25 MG PO TABS: 6.2500 mg | ORAL_TABLET | Freq: Two times a day (BID) | ORAL | 0 refills | Status: DC

## 2021-02-28 NOTE — Progress Notes (Signed)
Subjective:  James Bryant is a 75 y.o. male who presents for Chief Complaint  Patient presents with   ran out of medication    Ran out of medications since April. Out of all meds     Here for med check.  Accompanied by the [name] wife, and granddaughter here as well.  Currently out of all medication.   Last visit > 1 year ago so ran out of medication.  Not exercising.  Nonsmoker.   No alcohol.  Eats 3 times daily.  Not so heavy on fast food or junk food.       Please 30-day refill to local pharmacy 90-day refill to [facility]  In general been in usual state of health  He saw hematology in 2020 about polycythemia.  He refused phlebotomy treatments as he is very scared of needles  No new c/o  No other aggravating or relieving factors.    No other c/o.   Past Medical History:  Diagnosis Date   Elevated liver function tests 01/17/2019   Elevated serum creatinine 01/17/2019   Fear of needles 01/17/2019   Former smoker, stopped smoking many years ago 01/17/2019   Hypertension    Polycythemia 01/17/2019   Thrombocytosis ([place]) 01/17/2019     The following portions of the patient's history were reviewed and updated as appropriate: allergies, current medications, past family history, past medical history, past social history, past surgical history and problem list.  ROS Otherwise as in subjective above  Objective: BP (!) 150/100   Pulse (!) 105   Wt 171 lb 9.6 oz (77.8 kg)   BMI 27.91 kg/m   General appearance: alert, no distress, well developed, well nourished Neck: supple, no lymphadenopathy, no thyromegaly, no masses, no bruits, mild right sided JVD Heart: 2/6 systolic brief murmur, otherwise RRR, normal S1, S2 Lungs: decreased breath sounds in general, no wheezes, rhonchi, or rales Right calve with mild asymmetry diameter compared to left calve, but no edema, nontender, no discoloration Pulses: 2+ radial pulses, 2+ pedal pulses, normal cap refill Ext: no  edema   Assessment: Encounter Diagnoses  Name Primary?   Uncontrolled hypertension Yes   Thrombocytosis    Vitamin D deficiency    Polycythemia vera (HCC)    Severe needle phobia    Elevated serum creatinine    Former smoker, stopped smoking many years ago    Elevated liver function tests    Abnormal lung field    Stage 3a chronic kidney disease ([place])      Plan: HTN - discussed need for more regular f/u.  Advised home BP monitoring . Discussed last echocardiogram 2020 that showed decreased EF . Discussed that he may need updated echo.  He wants to keep all care here and not necessarily have multiple specialists.    Polycythemia - declines phlebotomy.  Restart Hydroxyurea.    Decreased lung sounds, former smoker, consider chest xray.  CKD 3 a based on GFR and prior readings, likely due to HTN   Patient Instructions   Restart medications, but we will do this in a stepwise fashion so it will drop your pressure to go to quick  Begin back on Valsartan HCT and Carvedilol first Valsartan HCT is once daily in the morning Carvedilol is twice daily Check your blood pressures 2 or 3 days/week After 3 or 4 weeks if your blood pressures are still running greater than 140/90 then we will need to add back the amlodipine I am not sending the Amlodipine to the  pharmacy just yet to make sure you are tolerating being back on those 3 medicines first I would asked you to call or recheck in 4 to 6 weeks to let us know where your blood pressures are running to see if we need to add back the Amlodipine or not Begin back on the Aspirin daily as well Begin back on Hydroxyurea as well .  This is for the elevated red cells and platelets    [name] was seen today for ran out of medication.  Diagnoses and all orders for this visit:  Uncontrolled hypertension -     CBC with Differential/Platelet -     Lipid panel -     Renal Function Panel -     Hepatic function panel  Thrombocytosis -      hydroxyurea (HYDREA) 500 MG capsule; Take 1 capsule (500 mg total) by mouth 2 (two) times daily. May take with food to minimize GI side effects.  Vitamin D deficiency  Polycythemia vera (HCC) -     Iron, TIBC and Ferritin Panel  Severe needle phobia  Elevated serum creatinine -     Renal Function Panel  Former smoker, stopped smoking many years ago  Elevated liver function tests -     Hepatic function panel  Abnormal lung field  Stage 3a chronic kidney disease ([place])  Other orders -     Discontinue: hydroxyurea (HYDREA) 500 MG capsule; Take 1 capsule (500 mg total) by mouth 2 (two) times daily. May take with food to minimize GI side effects. -     Discontinue: carvedilol (COREG) 6.25 MG tablet; Take 1 tablet (6.25 mg total) by mouth 2 (two) times daily. Please make yearly appt with Dr. Acie Fredrickson for July before anymore refills. 1st attempt -     Discontinue: valsartan-hydrochlorothiazide (DIOVAN-HCT) 160-12.5 MG tablet; Take 1 tablet by mouth daily. -     carvedilol (COREG) 6.25 MG tablet; Take 1 tablet (6.25 mg total) by mouth 2 (two) times daily. Please make yearly appt with Dr. Acie Fredrickson for July before anymore refills. 1st attempt -     aspirin EC 81 MG tablet; Take 1 tablet (81 mg total) by mouth daily. -     valsartan-hydrochlorothiazide (DIOVAN-HCT) 160-12.5 MG tablet; Take 1 tablet by mouth daily.   Follow up: pending labs

## 2021-02-28 NOTE — Patient Instructions (Addendum)
  Restart medications, but we will do this in a stepwise fashion so it will drop your pressure to go to quick  Begin back on Valsartan HCT and Carvedilol first Valsartan HCT is once daily in the morning Carvedilol is twice daily Check your blood pressures 2 or 3 days/week After 3 or 4 weeks if your blood pressures are still running greater than 140/90 then we will need to add back the amlodipine I am not sending the Amlodipine to the pharmacy just yet to make sure you are tolerating being back on those 3 medicines first I would asked you to call or recheck in 4 to 6 weeks to let us know where your blood pressures are running to see if we need to add back the Amlodipine or not Begin back on the Aspirin daily as well Begin back on Hydroxyurea as well .  This is for the elevated red cells and platelets

## 2021-03-01 ENCOUNTER — Other Ambulatory Visit: Payer: Self-pay | Admitting: Medical

## 2021-03-01 DIAGNOSIS — R7989 Other specified abnormal findings of blood chemistry: Secondary | ICD-10-CM

## 2021-03-01 DIAGNOSIS — R5383 Other fatigue: Secondary | ICD-10-CM

## 2021-03-01 DIAGNOSIS — D751 Secondary polycythemia: Secondary | ICD-10-CM

## 2021-03-01 LAB — IRON,TIBC AND FERRITIN PANEL
Ferritin: 51 ng/mL (ref 30–400)
Iron Saturation: 8 % — CL (ref 15–55)
Iron: 30 ug/dL — ABNORMAL LOW (ref 38–169)
Total Iron Binding Capacity: 371 ug/dL (ref 250–450)
UIBC: 341 ug/dL (ref 111–343)

## 2021-03-01 LAB — HEPATIC FUNCTION PANEL
ALT: 44 IU/L (ref 0–44)
AST: 39 IU/L (ref 0–40)
Alkaline Phosphatase: 236 IU/L — ABNORMAL HIGH (ref 44–121)
Bilirubin Total: 2.5 mg/dL — ABNORMAL HIGH (ref 0.0–1.2)
Bilirubin, Direct: 0.62 mg/dL — ABNORMAL HIGH (ref 0.00–0.40)
Total Protein: 7.1 g/dL (ref 6.0–8.5)

## 2021-03-01 LAB — CBC WITH DIFFERENTIAL/PLATELET
Basophils Absolute: 0.3 10*3/uL — ABNORMAL HIGH (ref 0.0–0.2)
Basos: 2 %
EOS (ABSOLUTE): 0.2 10*3/uL (ref 0.0–0.4)
Eos: 2 %
Hematocrit: 59.7 % — ABNORMAL HIGH (ref 37.5–51.0)
Hemoglobin: 19 g/dL — ABNORMAL HIGH (ref 13.0–17.7)
Immature Grans (Abs): 0 10*3/uL (ref 0.0–0.1)
Immature Granulocytes: 0 %
Lymphocytes Absolute: 2.9 10*3/uL (ref 0.7–3.1)
Lymphs: 21 %
MCH: 23.5 pg — ABNORMAL LOW (ref 26.6–33.0)
MCHC: 31.8 g/dL (ref 31.5–35.7)
MCV: 74 fL — ABNORMAL LOW (ref 79–97)
Monocytes Absolute: 0.7 10*3/uL (ref 0.1–0.9)
Monocytes: 5 %
Neutrophils Absolute: 10 10*3/uL — ABNORMAL HIGH (ref 1.4–7.0)
Neutrophils: 70 %
Platelets: 865 10*3/uL (ref 150–450)
RBC: 8.08 x10E6/uL (ref 4.14–5.80)
RDW: 23.1 % — ABNORMAL HIGH (ref 11.6–15.4)
WBC: 14.2 10*3/uL — ABNORMAL HIGH (ref 3.4–10.8)

## 2021-03-01 LAB — LIPID PANEL
Chol/HDL Ratio: 2.9 ratio (ref 0.0–5.0)
Cholesterol, Total: 112 mg/dL (ref 100–199)
HDL: 38 mg/dL — ABNORMAL LOW (ref >39)
LDL Chol Calc (NIH): 61 mg/dL (ref 0–99)
Triglycerides: 60 mg/dL (ref 0–149)
VLDL Cholesterol Cal: 13 mg/dL (ref 5–40)

## 2021-03-01 LAB — RENAL FUNCTION PANEL
Albumin: 4 g/dL (ref 3.7–4.7)
BUN/Creatinine Ratio: 15 (ref 10–24)
BUN: 24 mg/dL (ref 8–27)
CO2: 17 mmol/L — ABNORMAL LOW (ref 20–29)
Calcium: 9.8 mg/dL (ref 8.6–10.2)
Chloride: 103 mmol/L (ref 96–106)
Creatinine, Ser: 1.6 mg/dL — ABNORMAL HIGH (ref 0.76–1.27)
Glucose: 91 mg/dL (ref 65–99)
Phosphorus: 3.8 mg/dL (ref 2.8–4.1)
Potassium: 5.6 mmol/L — ABNORMAL HIGH (ref 3.5–5.2)
Sodium: 140 mmol/L (ref 134–144)
eGFR: 44 mL/min/{1.73_m2} — ABNORMAL LOW (ref >59)

## 2021-03-01 MED ORDER — FERROUS GLUCONATE 324 (38 FE) MG PO TABS: 324.0000 mg | ORAL_TABLET | Freq: Every day | ORAL | 0 refills | Status: DC

## 2021-03-02 ENCOUNTER — Telehealth: Payer: Self-pay | Admitting: Cardiovascular Disease

## 2021-03-02 NOTE — Telephone Encounter (Signed)
Pt c/o medication issue:  1. Name of Medication:   2. How are you currently taking this medication (dosage and times per day)?  hydroxyurea (HYDREA) 500 MG capsule  3. Are you having a reaction (difficulty breathing--STAT)? No   4. What is your medication issue? Daughter calling stating PCP is wanting pt seen in the next 30 days to see if Dr. Acie Fredrickson is in agreement with continuing this medication. Unable to find anything available. Please advise.

## 2021-03-02 NOTE — Telephone Encounter (Signed)
Spoke with the patient's daughter who states that the patient's PCP advised him to get an appointment with his cardiologist to check his heart function. Patient is now on hydroxyurea. Patient has been scheduled for an appointment with Dr. Acie Fredrickson.

## 2021-03-11 ENCOUNTER — Other Ambulatory Visit: Payer: Medicare HMO

## 2021-03-21 ENCOUNTER — Telehealth: Payer: Self-pay | Admitting: Family Medicine

## 2021-03-21 NOTE — Telephone Encounter (Signed)
Pt's daughter, Sharlett Iles called. She is on pt HIPAA. She states that pt is not getting any better. His feet are swelling and pt is barely able to walk. She is getting very concerned and would like to speak to [place]. Please call daughter at [phone].

## 2021-03-22 NOTE — Telephone Encounter (Signed)
Pt is already scheduled for 8/3 for tomorrow

## 2021-03-23 ENCOUNTER — Other Ambulatory Visit: Payer: Self-pay

## 2021-03-23 ENCOUNTER — Other Ambulatory Visit: Payer: Self-pay | Admitting: Medical

## 2021-03-23 ENCOUNTER — Telehealth (INDEPENDENT_AMBULATORY_CARE_PROVIDER_SITE_OTHER): Payer: Medicare HMO | Admitting: Medical

## 2021-03-23 ENCOUNTER — Telehealth: Payer: Self-pay | Admitting: Internal Medicine

## 2021-03-23 VITALS — BP 146/87 | HR 62 | Wt 180.0 lb

## 2021-03-23 DIAGNOSIS — N1831 Chronic kidney disease, stage 3a: Secondary | ICD-10-CM | POA: Diagnosis not present

## 2021-03-23 DIAGNOSIS — D75839 Thrombocytosis, unspecified: Secondary | ICD-10-CM | POA: Diagnosis not present

## 2021-03-23 DIAGNOSIS — R609 Edema, unspecified: Secondary | ICD-10-CM | POA: Insufficient documentation

## 2021-03-23 DIAGNOSIS — R5383 Other fatigue: Secondary | ICD-10-CM

## 2021-03-23 DIAGNOSIS — F40231 Fear of injections and transfusions: Secondary | ICD-10-CM | POA: Diagnosis not present

## 2021-03-23 DIAGNOSIS — Z789 Other specified health status: Secondary | ICD-10-CM

## 2021-03-23 DIAGNOSIS — R7989 Other specified abnormal findings of blood chemistry: Secondary | ICD-10-CM

## 2021-03-23 DIAGNOSIS — R0602 Shortness of breath: Secondary | ICD-10-CM | POA: Insufficient documentation

## 2021-03-23 DIAGNOSIS — D45 Polycythemia vera: Secondary | ICD-10-CM

## 2021-03-23 DIAGNOSIS — E559 Vitamin D deficiency, unspecified: Secondary | ICD-10-CM

## 2021-03-23 DIAGNOSIS — Z87891 Personal history of nicotine dependence: Secondary | ICD-10-CM

## 2021-03-23 DIAGNOSIS — I10 Essential (primary) hypertension: Secondary | ICD-10-CM

## 2021-03-23 DIAGNOSIS — Z91199 Patient's noncompliance with other medical treatment and regimen due to unspecified reason: Secondary | ICD-10-CM

## 2021-03-23 DIAGNOSIS — D72829 Elevated white blood cell count, unspecified: Secondary | ICD-10-CM | POA: Insufficient documentation

## 2021-03-23 DIAGNOSIS — Z9119 Patient's noncompliance with other medical treatment and regimen: Secondary | ICD-10-CM

## 2021-03-23 DIAGNOSIS — R748 Abnormal levels of other serum enzymes: Secondary | ICD-10-CM

## 2021-03-23 DIAGNOSIS — Z7409 Other reduced mobility: Secondary | ICD-10-CM

## 2021-03-23 MED ORDER — FUROSEMIDE 20 MG PO TABS: 20.0000 mg | ORAL_TABLET | Freq: Every day | ORAL | 0 refills | Status: DC

## 2021-03-23 NOTE — Telephone Encounter (Signed)
Spoke with pts' daughter and advised to take pt to ER due to gasping for air, sob, coughing, can't move. Pt's daughter thinks its related to the leg swelling and wanted to discuss with shane first

## 2021-03-23 NOTE — Progress Notes (Signed)
This visit type was conducted due to national recommendations for restrictions regarding the COVID-19 Pandemic (e.g. social distancing) in an effort to limit this patient's exposure and mitigate transmission in our community.  Due to their co-morbid illnesses, this patient is at least at moderate risk for complications without adequate follow up.  This format is felt to be most appropriate for this patient at this time.    Documentation for virtual audio and video telecommunications through [place] encounter:  The patient was located at home. The provider was located in the office. The patient did consent to this visit and is aware of possible charges through their insurance for this visit.  The other persons participating in this telemedicine service were none. Time spent on call was 25 minutes and in review of previous records >45 minutes total.  This virtual service is not related to other E/M service within previous 7 days.    Subjective:  James Bryant is a 75 y.o. male who presents for Chief Complaint  Patient presents with   Leg Swelling    Leg swelling both legs. Trouble walking. All the time SOB, gasping for air, coughing at nighttime. Gotten worse since last seen here.      Virtual consult today multiple concerns.  Consult in conjunction with his daughter James Bryant and wife.  I had my first visit with him on February 28, 2021.  He normally sees James Dingwall NP here but she is out on medical leave.  He has numerous medical problems.  He has severe needle phobia.  He has history of noncompliance  He reports compliance with his medicines currently which were restarted last visit  Their main concern today is swelling in the legs and feet that is got progressively worse over the last few weeks.  He has some intermittent shortness of breath.  He denies orthopnea.  No wheezing.  No chest pain.  No palpitations.  No swelling in the arms or face or other.  He does have some coughing  at night but not during the day  He does snore but no witnessed apnea.  Denies nonrestful sleep.  No prior sleep study  He has seen cardiology in the past but not recently.  When he saw cardiology in the past he refused some of the evaluation they recommended because of require contrast dye.  He has polycythemia vera, has seen hematology in the past and declined phlebotomy treatment  Family also request home health evaluation.  They think he could benefit from nursing care at home.  He does have limited mobility, he has trouble with some of his ADLs.  His family looks after him currently.  He is having to use a cane some.  He feels quite a bit of fatigue in recent months.  No other aggravating or relieving factors.    No other c/o.  Past Medical History:  Diagnosis Date   Elevated liver function tests 01/17/2019   Elevated serum creatinine 01/17/2019   Fear of needles 01/17/2019   Former smoker, stopped smoking many years ago 01/17/2019   Hypertension    Polycythemia 01/17/2019   Thrombocytosis ([place]) 01/17/2019   Current Outpatient Medications on File Prior to Visit  Medication Sig Dispense Refill   aspirin EC 81 MG tablet Take 1 tablet (81 mg total) by mouth daily. 90 tablet 3   carvedilol (COREG) 6.25 MG tablet Take 1 tablet (6.25 mg total) by mouth 2 (two) times daily. Please make yearly appt with Dr. Acie Fredrickson for July before  anymore refills. 1st attempt 180 tablet 3   ferrous gluconate (FERGON) 324 MG tablet Take 1 tablet (324 mg total) by mouth daily with breakfast. 180 tablet 0   hydroxyurea (HYDREA) 500 MG capsule Take 1 capsule (500 mg total) by mouth 2 (two) times daily. May take with food to minimize GI side effects. 180 capsule 3   latanoprost (XALATAN) 0.005 % ophthalmic solution Place 1 drop into both eyes 2 (two) times a day.     valsartan-hydrochlorothiazide (DIOVAN-HCT) 160-12.5 MG tablet Take 1 tablet by mouth daily. 90 tablet 3   No current facility-administered medications  on file prior to visit.     The following portions of the patient's history were reviewed and updated as appropriate: allergies, current medications, past family history, past medical history, past social history, past surgical history and problem list.  ROS Otherwise as in subjective above  Objective: BP (!) 146/87   Pulse 62   Wt 180 lb (81.6 kg)   SpO2 97%   BMI 29.27 kg/m   General appearance: alert, no distress, well developed, well nourished, African-American male There appears to be some mild lower extremity swelling but no pitting edema from what I can tell on the video with family's cooperation No obvious labored breathing, no obvious wheezing or gasping for air.  He answers questions in complete sentences He is seated with a cane     Assessment: Encounter Diagnoses  Name Primary?   SOB (shortness of breath) Yes   Edema, unspecified type    Polycythemia vera (HCC)    Stage 3a chronic kidney disease (HCC)    Severe needle phobia    Thrombocytosis    Uncontrolled hypertension    Vitamin D deficiency    Former smoker, stopped smoking many years ago    Personal history of noncompliance with medical treatment, presenting hazards to health    Fatigue, unspecified type    Elevated liver function tests    Decreased activities of daily living (ADL)    Mobility impaired    Leukocytosis, unspecified type    Elevated alkaline phosphatase level      Plan: Shortness of breath and edema-we discussed the possible differential.  I advised a taking for a chest x-ray today.  He is reluctant to come in for labs as he has been in the past.  We discussed that differential could include heart failure, kidney failure, infection, dependent edema or other  He currently denies chest pain, palpitations, no fever, no obvious symptoms of illness including URI or urinary  I reviewed back on the lab work he had in July which showed abnormality.  At that time we restarted his medications  which he reports compliance  I will put a referral in for home health to come out to the house to do an evaluation to assess need  I recommended a sleep study.  They will consider  I recommended he come back in a week to recheck.  I recommended they check daily weights and bring those recordings in with them.  Continue current medications but begin Lasix once daily.  Elevate the legs.  If any worse in the short-term such as worsening shortness of breath, orthopnea, chest pain or other than call 911  Spent > 45 minutes face to face with patient in discussion of symptoms, evaluation, plan and recommendations.     N[place]ll was seen today for leg swelling.  Diagnoses and all orders for this visit:  SOB (shortness of breath) -  DG Chest 2 View; Future -     Ambulatory referral to [place] metabolic panel; Future -     Brain natriuretic peptide; Future  Edema, unspecified type -     DG Chest 2 View; Future -     Ambulatory referral to [place] metabolic panel; Future -     Brain natriuretic peptide; Future  Polycythemia vera ([place]) -     CBC with Differential/Platelet; Future  Stage 3a chronic kidney disease ([place]) -     Ambulatory referral to Home Health -     Comprehensive metabolic panel; Future -     Brain natriuretic peptide; Future  Severe needle phobia -     Ambulatory referral to Home Health  Thrombocytosis  Uncontrolled hypertension -     Ambulatory referral to Home Health  Vitamin D deficiency  Former smoker, stopped smoking many years ago  Personal history of noncompliance with medical treatment, presenting hazards to health -     Ambulatory referral to [place]  Fatigue, unspecified type -     Ambulatory referral to [place]  Elevated liver function tests  Decreased activities of daily living (ADL) -     Ambulatory referral to [place] impaired -     Ambulatory referral to [place]  Leukocytosis, unspecified type -     Alkaline Phosphatase, Isoenzymes; Future -     CBC with Differential/Platelet; Future  Elevated alkaline phosphatase level -     Alkaline Phosphatase, Isoenzymes; Future  Other orders -     furosemide (LASIX) 20 MG tablet; Take 1 tablet (20 mg total) by mouth daily.   Follow up: in 1 wk

## 2021-03-28 ENCOUNTER — Telehealth: Payer: Self-pay | Admitting: Family Medicine

## 2021-03-28 DIAGNOSIS — D72829 Elevated white blood cell count, unspecified: Secondary | ICD-10-CM | POA: Diagnosis not present

## 2021-03-28 DIAGNOSIS — E559 Vitamin D deficiency, unspecified: Secondary | ICD-10-CM | POA: Diagnosis not present

## 2021-03-28 DIAGNOSIS — N1831 Chronic kidney disease, stage 3a: Secondary | ICD-10-CM | POA: Diagnosis not present

## 2021-03-28 DIAGNOSIS — Z87891 Personal history of nicotine dependence: Secondary | ICD-10-CM | POA: Diagnosis not present

## 2021-03-28 DIAGNOSIS — D45 Polycythemia vera: Secondary | ICD-10-CM | POA: Diagnosis not present

## 2021-03-28 DIAGNOSIS — F40231 Fear of injections and transfusions: Secondary | ICD-10-CM | POA: Diagnosis not present

## 2021-03-28 DIAGNOSIS — I129 Hypertensive chronic kidney disease with stage 1 through stage 4 chronic kidney disease, or unspecified chronic kidney disease: Secondary | ICD-10-CM | POA: Diagnosis not present

## 2021-03-28 DIAGNOSIS — D75839 Thrombocytosis, unspecified: Secondary | ICD-10-CM | POA: Diagnosis not present

## 2021-03-28 DIAGNOSIS — Z7982 Long term (current) use of aspirin: Secondary | ICD-10-CM | POA: Diagnosis not present

## 2021-03-28 NOTE — Telephone Encounter (Signed)
Received a call from [place] with [name]. She is requesting nursing services for this pt for BP, swelling and non-compliance. She is requesting the following:  2 times a week for 2 weeks 1 time a week for 3 weeks 1 time every other week for 4 weeks One PRN.  Please advise [name] at [phone]. This a a James Bryant pt that Audelia Acton has been handling is her absence.

## 2021-03-29 ENCOUNTER — Ambulatory Visit
Admission: RE | Admit: 2021-03-29 | Discharge: 2021-03-29 | Disposition: A | Discharge status: Home / Self Care | Payer: Medicare HMO | Source: Ambulatory Visit | Attending: Medical | Admitting: Medical

## 2021-03-29 ENCOUNTER — Other Ambulatory Visit: Payer: Self-pay

## 2021-03-29 DIAGNOSIS — D751 Secondary polycythemia: Secondary | ICD-10-CM

## 2021-03-29 DIAGNOSIS — R5383 Other fatigue: Secondary | ICD-10-CM

## 2021-03-29 DIAGNOSIS — R609 Edema, unspecified: Secondary | ICD-10-CM

## 2021-03-29 DIAGNOSIS — R0602 Shortness of breath: Secondary | ICD-10-CM | POA: Diagnosis not present

## 2021-03-29 DIAGNOSIS — R188 Other ascites: Secondary | ICD-10-CM | POA: Diagnosis not present

## 2021-03-29 DIAGNOSIS — K8020 Calculus of gallbladder without cholecystitis without obstruction: Secondary | ICD-10-CM | POA: Diagnosis not present

## 2021-03-29 DIAGNOSIS — R7989 Other specified abnormal findings of blood chemistry: Secondary | ICD-10-CM

## 2021-03-29 NOTE — Telephone Encounter (Signed)
Left detailed message on tonya vm giving ok verbal orders

## 2021-03-30 DIAGNOSIS — Z87891 Personal history of nicotine dependence: Secondary | ICD-10-CM | POA: Diagnosis not present

## 2021-03-30 DIAGNOSIS — Z7982 Long term (current) use of aspirin: Secondary | ICD-10-CM | POA: Diagnosis not present

## 2021-03-30 DIAGNOSIS — D72829 Elevated white blood cell count, unspecified: Secondary | ICD-10-CM | POA: Diagnosis not present

## 2021-03-30 DIAGNOSIS — N1831 Chronic kidney disease, stage 3a: Secondary | ICD-10-CM | POA: Diagnosis not present

## 2021-03-30 DIAGNOSIS — D45 Polycythemia vera: Secondary | ICD-10-CM | POA: Diagnosis not present

## 2021-03-30 DIAGNOSIS — D75839 Thrombocytosis, unspecified: Secondary | ICD-10-CM | POA: Diagnosis not present

## 2021-03-30 DIAGNOSIS — F40231 Fear of injections and transfusions: Secondary | ICD-10-CM | POA: Diagnosis not present

## 2021-03-30 DIAGNOSIS — E559 Vitamin D deficiency, unspecified: Secondary | ICD-10-CM | POA: Diagnosis not present

## 2021-03-30 DIAGNOSIS — I129 Hypertensive chronic kidney disease with stage 1 through stage 4 chronic kidney disease, or unspecified chronic kidney disease: Secondary | ICD-10-CM | POA: Diagnosis not present

## 2021-03-31 ENCOUNTER — Telehealth: Payer: Self-pay | Admitting: Cardiovascular Disease

## 2021-03-31 ENCOUNTER — Other Ambulatory Visit: Payer: Self-pay | Admitting: Medical

## 2021-03-31 DIAGNOSIS — F40231 Fear of injections and transfusions: Secondary | ICD-10-CM | POA: Diagnosis not present

## 2021-03-31 DIAGNOSIS — Z87891 Personal history of nicotine dependence: Secondary | ICD-10-CM | POA: Diagnosis not present

## 2021-03-31 DIAGNOSIS — D72829 Elevated white blood cell count, unspecified: Secondary | ICD-10-CM | POA: Diagnosis not present

## 2021-03-31 DIAGNOSIS — N1831 Chronic kidney disease, stage 3a: Secondary | ICD-10-CM | POA: Diagnosis not present

## 2021-03-31 DIAGNOSIS — D75839 Thrombocytosis, unspecified: Secondary | ICD-10-CM | POA: Diagnosis not present

## 2021-03-31 DIAGNOSIS — D45 Polycythemia vera: Secondary | ICD-10-CM | POA: Diagnosis not present

## 2021-03-31 DIAGNOSIS — Z7982 Long term (current) use of aspirin: Secondary | ICD-10-CM | POA: Diagnosis not present

## 2021-03-31 DIAGNOSIS — E559 Vitamin D deficiency, unspecified: Secondary | ICD-10-CM | POA: Diagnosis not present

## 2021-03-31 DIAGNOSIS — I129 Hypertensive chronic kidney disease with stage 1 through stage 4 chronic kidney disease, or unspecified chronic kidney disease: Secondary | ICD-10-CM | POA: Diagnosis not present

## 2021-03-31 MED ORDER — POTASSIUM CHLORIDE ER 10 MEQ PO TBCR: 10.0000 meq | EXTENDED_RELEASE_TABLET | Freq: Two times a day (BID) | ORAL | 2 refills | Status: DC

## 2021-03-31 NOTE — Telephone Encounter (Signed)
James Bryant is calling stating [place] has decided not to go to the hospital today. She reports it is not due to him refusing, but that he is wanting to see what the increase in medication per our office will do first. She states he has decided if it does not help within the next few days he will go to the hospital in regards to it.

## 2021-03-31 NOTE — Telephone Encounter (Signed)
Spoke with Gabriel Cirri who states that the patient had a CT scan showing that he had fluid around his lungs and heart. She states that she has spoken to the patient's daughter and made her aware of the following recommendations from Chana Bode, PA-C Carlena Hurl, PA-C  03/31/2021  8:07 AM EDT     Please call patient and family.  We did a virtual visit recently with his daughter doing the primary talking and discussion   Unfortunately his scan shows heart failure, fluid in the lungs, even fluid in his body wall tissue.   Ideally he should go to the hospital to be seen through the hospital setting.  I recommend they take him to the hospital for evaluation and further treatment.   (However, I know that given his severe needle phobia and fear of the health system, he may not do this - nevertheless, strongly recommend he go).   Go ahead and double up on the Lasix taking 2 tablets in the morning and 1 tablet in the evening.  This would be 40 mg in the morning 20 mg in the evening.  Begin potassium which I sent to the pharmacy just now as the higher dose of Lasix will lower the potassium.   I am also forwarding a copy of this message to his cardiologist.     (If he refuses to go, then try and get him into cardiology within the next few days.   He needs to be rechecked at the very least in the next few days if he refuses to go to the hospital)   I called the patient's daughter who states that the patient is still asleep. I advised her that she needs to give him the recommendations from his PCP to go to the hospital. She will call back if patient refuses to go.

## 2021-03-31 NOTE — Telephone Encounter (Addendum)
PT had ct done and it shows heart failure as well as fluid on the lungs.. pt is refusing to go to the hospital and next available appt is not until 08/22... please advise    [id number] ask for Sabrina.Dorothea Ogle The PA

## 2021-04-01 NOTE — Telephone Encounter (Signed)
See Dr. Acie Fredrickson note

## 2021-04-04 ENCOUNTER — Telehealth: Payer: Medicare HMO | Admitting: Medical

## 2021-04-04 ENCOUNTER — Telehealth: Payer: Self-pay | Admitting: Family Medicine

## 2021-04-04 ENCOUNTER — Other Ambulatory Visit: Payer: Self-pay

## 2021-04-04 DIAGNOSIS — I129 Hypertensive chronic kidney disease with stage 1 through stage 4 chronic kidney disease, or unspecified chronic kidney disease: Secondary | ICD-10-CM | POA: Diagnosis not present

## 2021-04-04 DIAGNOSIS — D45 Polycythemia vera: Secondary | ICD-10-CM | POA: Diagnosis not present

## 2021-04-04 DIAGNOSIS — E559 Vitamin D deficiency, unspecified: Secondary | ICD-10-CM | POA: Diagnosis not present

## 2021-04-04 DIAGNOSIS — Z7982 Long term (current) use of aspirin: Secondary | ICD-10-CM | POA: Diagnosis not present

## 2021-04-04 DIAGNOSIS — D72829 Elevated white blood cell count, unspecified: Secondary | ICD-10-CM | POA: Diagnosis not present

## 2021-04-04 DIAGNOSIS — Z87891 Personal history of nicotine dependence: Secondary | ICD-10-CM | POA: Diagnosis not present

## 2021-04-04 DIAGNOSIS — D75839 Thrombocytosis, unspecified: Secondary | ICD-10-CM | POA: Diagnosis not present

## 2021-04-04 DIAGNOSIS — N1831 Chronic kidney disease, stage 3a: Secondary | ICD-10-CM | POA: Diagnosis not present

## 2021-04-04 DIAGNOSIS — F40231 Fear of injections and transfusions: Secondary | ICD-10-CM | POA: Diagnosis not present

## 2021-04-04 NOTE — Telephone Encounter (Signed)
Received a call from [place] with [facility]. They are requesting PT for this pt one time a week for 9 weeks. Sending this back to [place] as Loletha Carrow is not in and he previously ok nursing care for pt.

## 2021-04-04 NOTE — Telephone Encounter (Signed)
Pt coming in friday

## 2021-04-04 NOTE — Telephone Encounter (Signed)
James Bryant was notified of verbal ok for PT

## 2021-04-05 DIAGNOSIS — N1831 Chronic kidney disease, stage 3a: Secondary | ICD-10-CM | POA: Diagnosis not present

## 2021-04-05 DIAGNOSIS — E559 Vitamin D deficiency, unspecified: Secondary | ICD-10-CM | POA: Diagnosis not present

## 2021-04-05 DIAGNOSIS — D72829 Elevated white blood cell count, unspecified: Secondary | ICD-10-CM | POA: Diagnosis not present

## 2021-04-05 DIAGNOSIS — D45 Polycythemia vera: Secondary | ICD-10-CM | POA: Diagnosis not present

## 2021-04-05 DIAGNOSIS — I129 Hypertensive chronic kidney disease with stage 1 through stage 4 chronic kidney disease, or unspecified chronic kidney disease: Secondary | ICD-10-CM | POA: Diagnosis not present

## 2021-04-05 DIAGNOSIS — D75839 Thrombocytosis, unspecified: Secondary | ICD-10-CM | POA: Diagnosis not present

## 2021-04-05 DIAGNOSIS — Z87891 Personal history of nicotine dependence: Secondary | ICD-10-CM | POA: Diagnosis not present

## 2021-04-05 DIAGNOSIS — F40231 Fear of injections and transfusions: Secondary | ICD-10-CM | POA: Diagnosis not present

## 2021-04-05 DIAGNOSIS — Z7982 Long term (current) use of aspirin: Secondary | ICD-10-CM | POA: Diagnosis not present

## 2021-04-06 DIAGNOSIS — E559 Vitamin D deficiency, unspecified: Secondary | ICD-10-CM | POA: Diagnosis not present

## 2021-04-06 DIAGNOSIS — F40231 Fear of injections and transfusions: Secondary | ICD-10-CM | POA: Diagnosis not present

## 2021-04-06 DIAGNOSIS — D45 Polycythemia vera: Secondary | ICD-10-CM | POA: Diagnosis not present

## 2021-04-06 DIAGNOSIS — D75839 Thrombocytosis, unspecified: Secondary | ICD-10-CM | POA: Diagnosis not present

## 2021-04-06 DIAGNOSIS — N1831 Chronic kidney disease, stage 3a: Secondary | ICD-10-CM | POA: Diagnosis not present

## 2021-04-06 DIAGNOSIS — Z7982 Long term (current) use of aspirin: Secondary | ICD-10-CM | POA: Diagnosis not present

## 2021-04-06 DIAGNOSIS — Z87891 Personal history of nicotine dependence: Secondary | ICD-10-CM | POA: Diagnosis not present

## 2021-04-06 DIAGNOSIS — D72829 Elevated white blood cell count, unspecified: Secondary | ICD-10-CM | POA: Diagnosis not present

## 2021-04-06 DIAGNOSIS — I129 Hypertensive chronic kidney disease with stage 1 through stage 4 chronic kidney disease, or unspecified chronic kidney disease: Secondary | ICD-10-CM | POA: Diagnosis not present

## 2021-04-08 ENCOUNTER — Ambulatory Visit (INDEPENDENT_AMBULATORY_CARE_PROVIDER_SITE_OTHER): Payer: Medicare HMO | Admitting: Medical

## 2021-04-08 ENCOUNTER — Telehealth: Payer: Self-pay | Admitting: Medical

## 2021-04-08 ENCOUNTER — Other Ambulatory Visit: Payer: Self-pay

## 2021-04-08 ENCOUNTER — Other Ambulatory Visit: Payer: Self-pay | Admitting: Medical

## 2021-04-08 VITALS — BP 130/80 | HR 71 | Temp 97.1°F | Resp 18 | Wt 168.2 lb

## 2021-04-08 DIAGNOSIS — E611 Iron deficiency: Secondary | ICD-10-CM

## 2021-04-08 DIAGNOSIS — R0602 Shortness of breath: Secondary | ICD-10-CM

## 2021-04-08 DIAGNOSIS — I509 Heart failure, unspecified: Secondary | ICD-10-CM

## 2021-04-08 DIAGNOSIS — Z7409 Other reduced mobility: Secondary | ICD-10-CM

## 2021-04-08 DIAGNOSIS — F40231 Fear of injections and transfusions: Secondary | ICD-10-CM

## 2021-04-08 DIAGNOSIS — N1831 Chronic kidney disease, stage 3a: Secondary | ICD-10-CM

## 2021-04-08 DIAGNOSIS — Z789 Other specified health status: Secondary | ICD-10-CM

## 2021-04-08 DIAGNOSIS — D75839 Thrombocytosis, unspecified: Secondary | ICD-10-CM

## 2021-04-08 DIAGNOSIS — D45 Polycythemia vera: Secondary | ICD-10-CM | POA: Diagnosis not present

## 2021-04-08 DIAGNOSIS — I10 Essential (primary) hypertension: Secondary | ICD-10-CM | POA: Diagnosis not present

## 2021-04-08 DIAGNOSIS — Z9119 Patient's noncompliance with other medical treatment and regimen: Secondary | ICD-10-CM

## 2021-04-08 DIAGNOSIS — E559 Vitamin D deficiency, unspecified: Secondary | ICD-10-CM

## 2021-04-08 DIAGNOSIS — Z87891 Personal history of nicotine dependence: Secondary | ICD-10-CM

## 2021-04-08 DIAGNOSIS — D72829 Elevated white blood cell count, unspecified: Secondary | ICD-10-CM

## 2021-04-08 DIAGNOSIS — R0600 Dyspnea, unspecified: Secondary | ICD-10-CM | POA: Insufficient documentation

## 2021-04-08 DIAGNOSIS — R609 Edema, unspecified: Secondary | ICD-10-CM

## 2021-04-08 DIAGNOSIS — R748 Abnormal levels of other serum enzymes: Secondary | ICD-10-CM | POA: Insufficient documentation

## 2021-04-08 DIAGNOSIS — Z91199 Patient's noncompliance with other medical treatment and regimen due to unspecified reason: Secondary | ICD-10-CM | POA: Insufficient documentation

## 2021-04-08 MED ORDER — FLUTICASONE-SALMETEROL 100-50 MCG/ACT IN AEPB: 1.0000 | INHALATION_SPRAY | Freq: Two times a day (BID) | RESPIRATORY_TRACT | 1 refills | Status: DC

## 2021-04-08 NOTE — Telephone Encounter (Signed)
James Bryant - can we either get him in this week with same cardiology office, or send to Dr. Missy Sabins office or [facility] next week if possible - heart failure.    Of note: I spoke to daughter who is helping take care of him  I advised increase afternoon Lasix  to change to lasix 2 tablets twice daily  I added Advair and discussed proper use  We will try and get in to cardiologist sooner   [place] ED over weekend if any change or worse concerns.

## 2021-04-08 NOTE — Progress Notes (Signed)
Subjective:  James Bryant is a 75 y.o. male who presents for Chief Complaint  Patient presents with   Follow-up    Follow-up on heart failure. Feel like lasix has helped with swelling. Has breathing difficuiltes when laying on right side     Here for follow-up on abnormal recent chest x-ray and CT scan, symptoms.  He is accompanied by his wife today and his daughter is on the phone listening in remotely  He has a severe needle phobia and declines labs.  He refuses lab work today  Recently his imaging suggested heart failure.  Since his last visit here in person in July we did a virtual consult due to ongoing swelling.  He has been taking his Lasix 2 in the morning and 1 in the afternoon along with potassium in the last few weeks since our virtual consult.  The swelling is gotten some better but still an issue.  Home health nursing home health physical therapy have been at the house.  He has been doing some physical therapy with some improvements in the fluid  He feels fine today.  He says he has felt fine in the last week or so.  No current chest pain, no current palpitations.  He does note some swelling in the legs and feels a little winded at times  He is a former smoker.  He quit smoking 27 years ago.  He had a 20-pack-year history  He notes compliance with all of his other medicines that were recently started from the July visit  He does note orthopnea if lying on his right side  Thrombocytosis and polycythemia vera, chronic.  He has declined phlebotomy treatment in the past.  He is currently taking hydroxyurea  He is taking a ferrous gluconate iron once daily from his recent labs showing low iron  No other aggravating or relieving factors.    No other c/o.   Past Medical History:  Diagnosis Date   Elevated liver function tests 01/17/2019   Elevated serum creatinine 01/17/2019   Fear of needles 01/17/2019   Former smoker, stopped smoking many years ago 01/17/2019    Hypertension    Polycythemia 01/17/2019   Thrombocytosis ([place]) 01/17/2019    Current Outpatient Medications on File Prior to Visit  Medication Sig Dispense Refill   aspirin EC 81 MG tablet Take 1 tablet (81 mg total) by mouth daily. 90 tablet 3   carvedilol (COREG) 6.25 MG tablet Take 1 tablet (6.25 mg total) by mouth 2 (two) times daily. Please make yearly appt with Dr. Acie Fredrickson for July before anymore refills. 1st attempt 180 tablet 3   ferrous gluconate (FERGON) 324 MG tablet Take 1 tablet (324 mg total) by mouth daily with breakfast. 180 tablet 0   furosemide (LASIX) 20 MG tablet TAKE 1 TABLET(20 MG) BY MOUTH DAILY (Patient taking differently: 2 tablets in am, 1 tab in pm) 90 tablet 0   hydroxyurea (HYDREA) 500 MG capsule Take 1 capsule (500 mg total) by mouth 2 (two) times daily. May take with food to minimize GI side effects. 180 capsule 3   latanoprost (XALATAN) 0.005 % ophthalmic solution Place 1 drop into both eyes 2 (two) times a day.     potassium chloride (KLOR-CON 10) 10 MEQ tablet Take 1 tablet (10 mEq total) by mouth 2 (two) times daily. 60 tablet 2   valsartan-hydrochlorothiazide (DIOVAN-HCT) 160-12.5 MG tablet Take 1 tablet by mouth daily. 90 tablet 3   No current facility-administered medications on file prior  to visit.     The following portions of the patient's history were reviewed and updated as appropriate: allergies, current medications, past family history, past medical history, past social history, past surgical history and problem list.  ROS Otherwise as in subjective above  Objective: BP 130/80   Pulse 71   Temp (!) 97.1 F (36.2 C)   Resp 18   Wt 168 lb 3.2 oz (76.3 kg)   SpO2 94%   BMI 27.36 kg/m   General appearance: alert, no distress, well developed, well nourished Neck: supple, no lymphadenopathy, no thyromegaly, no masses, no bruits, mild right sided JVD Heart: 2/6 systolic brief murmur, otherwise RRR, normal S1, S2 Lungs: decreased breath sounds  in general, no wheezes, rhonchi, + lower field rales Right calve with mild asymmetry diameter compared to left calve, but no edema, nontender, no discoloration Pulses: 2+ radial pulses, there 1+ pedal pulses, normal cap refill Ext: 2+ pitting edema of ankles, otherwise no obvious edema in the arms and legs   Assessment: Encounter Diagnoses  Name Primary?   Congestive heart failure, NYHA class 2, unspecified congestive heart failure type (HCC) Yes   SOB (shortness of breath)    Uncontrolled hypertension    Stage 3a chronic kidney disease (HCC)    Severe needle phobia    Polycythemia vera ([place])    Thrombocytosis    Vitamin D deficiency    Mobility impaired    Leukocytosis, unspecified type    Personal history of noncompliance with medical treatment, presenting hazards to health    Decreased activities of daily living (ADL)    Edema, unspecified type    Former smoker, stopped smoking many years ago    Alkaline phosphatase elevation      Plan: CHF-we discussed the fact that he has heart failure.  We discussed the nature of this disease.  I reviewed his abnormal echocardiogram from 2020 showing EF of 25 to 30% at that time.  He has seen some improvement in symptoms since adding the Lasix recently.  His swelling has improved since last visit virtually but still has rales in the lungs, presumably still not much improved overall.  Of note he was noncompliant and had been out of medications for several months prior to his July 2022 visit.  We discussed the need to check daily weights.  We discussed the need to limit salt and keep fluid intake consistent.  Follow-up with cardiology soon as planned.  Go to emergency department or call 911 if worsening  I have advised him to look at his chart today and we will call back with some other recommendations.  I recommend rechecking labs but he declined blood draw today due to severe needle phobia  CKD 3 a -continue current medications.  He refuses  blood work today to monitor this.  Former smoker-PFT reviewed today  Iron deficiency-he is taking ferrous gluconate once daily since last visit   Polycythemia vera, thrombocytosis-on hydroxyurea.  He refuses phlebotomy treatment.  He also refused phlebotomy treatment in the past through oncology hematology.  Recently his alkaline phosphatase was elevated.  I wanted to check some additional labs today, but he refuses blood work today.  This likely could be related to his polycythemia vera.  Decreased mobility and problems with ADLs -he does now have home health nurse and physical therapist coming to the home since last visit.  His physical therapy has been helping his swelling a bit.   There are no Patient Instructions on file for this  visit.   Sadat was seen today for follow-up.  Diagnoses and all orders for this visit:  Congestive heart failure, NYHA class 2, unspecified congestive heart failure type (HCC)  SOB (shortness of breath) -     Spirometry with graph  Uncontrolled hypertension  Stage 3a chronic kidney disease (HCC)  Severe needle phobia  Polycythemia vera ([place])  Thrombocytosis  Vitamin D deficiency  Mobility impaired  Leukocytosis, unspecified type  Personal history of noncompliance with medical treatment, presenting hazards to health  Decreased activities of daily living (ADL)  Edema, unspecified type  Former smoker, stopped smoking many years ago  Alkaline phosphatase elevation   Follow up: with cardiology soon.

## 2021-04-08 NOTE — Addendum Note (Signed)
Addended by: Carlena Hurl on: 04/08/2021 05:31 PM   Modules accepted: Orders

## 2021-04-11 DIAGNOSIS — D72829 Elevated white blood cell count, unspecified: Secondary | ICD-10-CM | POA: Diagnosis not present

## 2021-04-11 DIAGNOSIS — D45 Polycythemia vera: Secondary | ICD-10-CM | POA: Diagnosis not present

## 2021-04-11 DIAGNOSIS — Z87891 Personal history of nicotine dependence: Secondary | ICD-10-CM | POA: Diagnosis not present

## 2021-04-11 DIAGNOSIS — D75839 Thrombocytosis, unspecified: Secondary | ICD-10-CM | POA: Diagnosis not present

## 2021-04-11 DIAGNOSIS — E559 Vitamin D deficiency, unspecified: Secondary | ICD-10-CM | POA: Diagnosis not present

## 2021-04-11 DIAGNOSIS — N1831 Chronic kidney disease, stage 3a: Secondary | ICD-10-CM | POA: Diagnosis not present

## 2021-04-11 DIAGNOSIS — I129 Hypertensive chronic kidney disease with stage 1 through stage 4 chronic kidney disease, or unspecified chronic kidney disease: Secondary | ICD-10-CM | POA: Diagnosis not present

## 2021-04-11 DIAGNOSIS — Z7982 Long term (current) use of aspirin: Secondary | ICD-10-CM | POA: Diagnosis not present

## 2021-04-11 DIAGNOSIS — F40231 Fear of injections and transfusions: Secondary | ICD-10-CM | POA: Diagnosis not present

## 2021-04-11 NOTE — Telephone Encounter (Signed)
What is the status on this. If we can't get in with heartcare we can try another cardiology

## 2021-04-11 NOTE — Telephone Encounter (Signed)
note

## 2021-04-12 ENCOUNTER — Ambulatory Visit: Payer: Medicare HMO | Admitting: Cardiovascular Disease

## 2021-04-12 ENCOUNTER — Encounter: Payer: Self-pay | Admitting: Cardiovascular Disease

## 2021-04-12 ENCOUNTER — Encounter: Payer: Self-pay | Admitting: Nurse Practitioner

## 2021-04-12 ENCOUNTER — Other Ambulatory Visit: Payer: Self-pay

## 2021-04-12 VITALS — BP 136/80 | HR 77 | Ht 65.75 in | Wt 165.6 lb

## 2021-04-12 DIAGNOSIS — I5040 Unspecified combined systolic (congestive) and diastolic (congestive) heart failure: Secondary | ICD-10-CM

## 2021-04-12 DIAGNOSIS — I10 Essential (primary) hypertension: Secondary | ICD-10-CM | POA: Diagnosis not present

## 2021-04-12 DIAGNOSIS — R0600 Dyspnea, unspecified: Secondary | ICD-10-CM | POA: Diagnosis not present

## 2021-04-12 NOTE — Patient Instructions (Signed)
Medication Instructions:  Your physician recommends that you continue on your current medications as directed. Please refer to the Current Medication list given to you today.  *If you need a refill on your cardiac medications before your next appointment, please call your pharmacy*   Lab Work: TODAY - BMET If you have labs (blood work) drawn today and your tests are completely normal, you will receive your results only by: [place] (if you have MyChart) OR A paper copy in the mail If you have any lab test that is abnormal or we need to change your treatment, we will call you to review the results.   Testing/Procedures: Your physician has requested that you have a lexiscan myoview. For further information please visit [email or web address]. Please follow instruction sheet, as given.   Follow-Up: At [facility], you and your health needs are our priority.  As part of our continuing mission to provide you with exceptional heart care, we have created designated Provider Care Teams.  These Care Teams include your primary Cardiologist (physician) and Advanced Practice Providers (APPs -  Physician Assistants and Nurse Practitioners) who all work together to provide you with the care you need, when you need it.   Your next appointment:   3 month(s) on September 08, 2021  The format for your next appointment:   In Person  Provider:   Robbie Lis, PA-C

## 2021-04-12 NOTE — Progress Notes (Signed)
Cardiology Office Note:    Date:  04/12/2021   ID:  Or, Rabelo [birth year], MRN [id number]  PCP:  Girtha Rm, NP-C   [facility] Providers Cardiologist:  Mertie Moores, MD     Referring MD: Girtha Rm, NP-C   Chief Complaint  Patient presents with   Hypertension     HAug. 23, 2022   James Bryant is a 75 y.o. male with a hx of HTN  I saw him via video visit durig covid. No CP or dyspnea. Able to do his normal chores.   Has someone mow for him .  Echo shows EF 25-30%.  Grade 1 DD   Past Medical History:  Diagnosis Date   Elevated liver function tests 01/17/2019   Elevated serum creatinine 01/17/2019   Fear of needles 01/17/2019   Former smoker, stopped smoking many years ago 01/17/2019   Hypertension    Polycythemia 01/17/2019   Thrombocytosis 01/17/2019    History reviewed. No pertinent surgical history.  Current Medications: Current Meds  Medication Sig   aspirin EC 81 MG tablet Take 1 tablet (81 mg total) by mouth daily.   carvedilol (COREG) 6.25 MG tablet Take 1 tablet (6.25 mg total) by mouth 2 (two) times daily. Please make yearly appt with Dr. Acie Fredrickson for July before anymore refills. 1st attempt   ferrous gluconate (FERGON) 324 MG tablet Take 1 tablet (324 mg total) by mouth daily with breakfast.   fluticasone-salmeterol (ADVAIR) 100-50 MCG/ACT AEPB INHALE 1 PUFF BY MOUTH INTO THE LUNGS TWICE DAILY   furosemide (LASIX) 40 MG tablet Take 40 mg by mouth 2 (two) times daily.   hydroxyurea (HYDREA) 500 MG capsule Take 1 capsule (500 mg total) by mouth 2 (two) times daily. May take with food to minimize GI side effects.   latanoprost (XALATAN) 0.005 % ophthalmic solution Place 1 drop into both eyes 2 (two) times a day.   potassium chloride (KLOR-CON 10) 10 MEQ tablet Take 1 tablet (10 mEq total) by mouth 2 (two) times daily.   valsartan-hydrochlorothiazide (DIOVAN-HCT) 160-12.5 MG tablet Take 1 tablet by mouth daily.   [DISCONTINUED] furosemide  (LASIX) 20 MG tablet TAKE 1 TABLET(20 MG) BY MOUTH DAILY (Patient taking differently: Take 40 mg by mouth 2 (two) times daily.)     Allergies:   Patient has no known allergies.   Social History   Socioeconomic History   Marital status: Married    Spouse name: Not on file   Number of children: Not on file   Years of education: Not on file   Highest education level: Not on file  Occupational History   Not on file  Tobacco Use   Smoking status: Former   Smokeless tobacco: Never  Vaping Use   Vaping Use: Never used  Substance and Sexual Activity   Alcohol use: No    Comment: stopped many years ago    Drug use: Never   Sexual activity: Not on file  Other Topics Concern   Not on file  Social History Narrative   Married. Retired Administrator. Stopped smoking at age 53, smoked since age 56. Stopped alcohol use at age 49. Moved here from [place], [place] recently.    Extreme fear of needles.    Social Determinants of Health   Financial Resource Strain: Not on file  Food Insecurity: Not on file  Transportation Needs: Not on file  Physical Activity: Not on file  Stress: Not on file  Social Connections: Not  on file     Family History: The patient's family history includes Heart disease in his mother; Hypertension in his mother.  ROS:   Please see the history of present illness.     All other systems reviewed and are negative.  EKGs/Labs/Other Studies Reviewed:    The following studies were reviewed today:   EKG: August 2030, 2022: Normal sinus rhythm at 77.  He has T wave inversions in leads V4 through V6. Recent Labs: 02/28/2021: ALT 44; BUN 24; Creatinine, Ser 1.60; Hemoglobin 19.0; Platelets 865; Potassium 5.6; Sodium 140  Recent Lipid Panel    Component Value Date/Time   CHOL 112 02/28/2021 1150   TRIG 60 02/28/2021 1150   HDL 38 (L) 02/28/2021 1150   CHOLHDL 2.9 02/28/2021 1150   LDLCALC 61 02/28/2021 1150     Risk Assessment/Calculations:           Physical Exam:    VS:  BP 136/80   Pulse 77   Ht 5' 5.75" (1.67 m)   Wt 165 lb 9.6 oz (75.1 kg)   SpO2 97%   BMI 26.93 kg/m     Wt Readings from Last 3 Encounters:  04/12/21 165 lb 9.6 oz (75.1 kg)  04/08/21 168 lb 3.2 oz (76.3 kg)  03/23/21 180 lb (81.6 kg)     GEN:  elderly , chronically ill appearing man  HEENT: Normal NECK: No JVD; No carotid bruits LYMPHATICS: No lymphadenopathy CARDIAC: RRR, no murmurs, rubs, gallops RESPIRATORY:  Clear to auscultation without rales, wheezing or rhonchi  ABDOMEN: Soft, non-tender, non-distended MUSCULOSKELETAL:  No edema; No deformity  SKIN: Warm and dry NEUROLOGIC:  Alert and oriented x 3 PSYCHIATRIC:  Normal affect   ASSESSMENT:    1. Combined systolic and diastolic congestive heart failure, NYHA class 2, unspecified congestive heart failure chronicity ([place])   2. Dyspnea, unspecified type   3. Uncontrolled hypertension    PLAN:    In order of problems listed above:  Chronic combined CHF :   cont meds, he is on Coreg, Valsartan, lasix .  Potassium is elevated - 5.6 .   Has creatinine of 1.6.  I dont think we can add spironolactone at this point   K is a little elevated.   Recheck today  Needs a stress myoview ( lexi)   2.  CKD - creatinine is 1.6   3.  Hyperkalemic:   5.6 at last check .   Will check today    Please call Sharlett Iles with all result and appointments  - [phone]    Shared Decision Making/Informed Consent The risks [chest pain, shortness of breath, cardiac arrhythmias, dizziness, blood pressure fluctuations, myocardial infarction, stroke/transient ischemic attack, nausea, vomiting, allergic reaction, radiation exposure, metallic taste sensation and life-threatening complications (estimated to be 1 in 10,000)], benefits (risk stratification, diagnosing coronary artery disease, treatment guidance) and alternatives of a nuclear stress test were discussed in detail with James Bryant and he agrees to  proceed.    Medication Adjustments/Labs and Tests Ordered: Current medicines are reviewed at length with the patient today.  Concerns regarding medicines are outlined above.  Orders Placed This Encounter  Procedures   Basic Metabolic Panel (BMET)   Cardiac Stress Test: Informed Consent Details: Physician/Practitioner Attestation; Transcribe to consent form and obtain patient signature   MYOCARDIAL PERFUSION IMAGING   EKG 12-Lead    No orders of the defined types were placed in this encounter.   Patient Instructions  Medication Instructions:  Your physician recommends that you  continue on your current medications as directed. Please refer to the Current Medication list given to you today.  *If you need a refill on your cardiac medications before your next appointment, please call your pharmacy*   Lab Work: TODAY - BMET If you have labs (blood work) drawn today and your tests are completely normal, you will receive your results only by: [place] (if you have MyChart) OR A paper copy in the mail If you have any lab test that is abnormal or we need to change your treatment, we will call you to review the results.   Testing/Procedures: Your physician has requested that you have a lexiscan myoview. For further information please visit [email or web address]. Please follow instruction sheet, as given.   Follow-Up: At [facility], you and your health needs are our priority.  As part of our continuing mission to provide you with exceptional heart care, we have created designated Provider Care Teams.  These Care Teams include your primary Cardiologist (physician) and Advanced Practice Providers (APPs -  Physician Assistants and Nurse Practitioners) who all work together to provide you with the care you need, when you need it.   Your next appointment:   3 month(s) on September 08, 2021  The format for your next appointment:   In Person  Provider:   Robbie Lis, PA-C      Signed, Mertie Moores, MD  04/12/2021 6:05 PM    [place]

## 2021-04-13 ENCOUNTER — Telehealth: Payer: Self-pay

## 2021-04-13 DIAGNOSIS — F40231 Fear of injections and transfusions: Secondary | ICD-10-CM | POA: Diagnosis not present

## 2021-04-13 DIAGNOSIS — I129 Hypertensive chronic kidney disease with stage 1 through stage 4 chronic kidney disease, or unspecified chronic kidney disease: Secondary | ICD-10-CM | POA: Diagnosis not present

## 2021-04-13 DIAGNOSIS — N1831 Chronic kidney disease, stage 3a: Secondary | ICD-10-CM | POA: Diagnosis not present

## 2021-04-13 DIAGNOSIS — D75839 Thrombocytosis, unspecified: Secondary | ICD-10-CM | POA: Diagnosis not present

## 2021-04-13 DIAGNOSIS — E559 Vitamin D deficiency, unspecified: Secondary | ICD-10-CM | POA: Diagnosis not present

## 2021-04-13 DIAGNOSIS — Z87891 Personal history of nicotine dependence: Secondary | ICD-10-CM | POA: Diagnosis not present

## 2021-04-13 DIAGNOSIS — D72829 Elevated white blood cell count, unspecified: Secondary | ICD-10-CM | POA: Diagnosis not present

## 2021-04-13 DIAGNOSIS — D45 Polycythemia vera: Secondary | ICD-10-CM | POA: Diagnosis not present

## 2021-04-13 DIAGNOSIS — Z7982 Long term (current) use of aspirin: Secondary | ICD-10-CM | POA: Diagnosis not present

## 2021-04-13 LAB — BASIC METABOLIC PANEL
BUN/Creatinine Ratio: 16 (ref 10–24)
BUN: 28 mg/dL — ABNORMAL HIGH (ref 8–27)
CO2: 24 mmol/L (ref 20–29)
Calcium: 8.9 mg/dL (ref 8.6–10.2)
Chloride: 99 mmol/L (ref 96–106)
Creatinine, Ser: 1.72 mg/dL — ABNORMAL HIGH (ref 0.76–1.27)
Glucose: 112 mg/dL — ABNORMAL HIGH (ref 65–99)
Potassium: 3.8 mmol/L (ref 3.5–5.2)
Sodium: 142 mmol/L (ref 134–144)
eGFR: 40 mL/min/{1.73_m2} — ABNORMAL LOW (ref >59)

## 2021-04-13 MED ORDER — VALSARTAN 160 MG PO TABS: 160.0000 mg | ORAL_TABLET | Freq: Every day | ORAL | 3 refills | Status: DC

## 2021-04-13 NOTE — Telephone Encounter (Signed)
Rx has been sent in for valsartan 160 mg daily.

## 2021-04-13 NOTE — Telephone Encounter (Signed)
-----   Message from Thayer Headings, MD sent at 04/13/2021  3:02 PM EDT ----- His creatinine is a bit higher.  He is on both lasix and HCTZ ( combo pill with valsartan)  Please DC valsartan-HCT.   Start Valsartan 160 mg  a day.  [place], [place] and [place].  I have talked to his daughter, Sharlett Iles and she will make sure he makes the proper changes . Has an appt for Lexican myoview stress test next week.  He has an appt to see me in several months.

## 2021-04-14 ENCOUNTER — Other Ambulatory Visit: Payer: Self-pay | Admitting: Medical

## 2021-04-14 ENCOUNTER — Telehealth: Payer: Self-pay | Admitting: Family Medicine

## 2021-04-14 MED ORDER — FLUTICASONE FUROATE-VILANTEROL 200-25 MCG/INH IN AEPB: 1.0000 | INHALATION_SPRAY | Freq: Every day | RESPIRATORY_TRACT | 2 refills | Status: DC

## 2021-04-14 NOTE — Telephone Encounter (Signed)
Pt daughter called Advair rx $103 for 1 month Too expensive  ? Different rx to [facility]

## 2021-04-15 ENCOUNTER — Telehealth: Payer: Self-pay

## 2021-04-15 ENCOUNTER — Other Ambulatory Visit: Payer: Self-pay | Admitting: Medical

## 2021-04-15 MED ORDER — FUROSEMIDE 40 MG PO TABS: 40.0000 mg | ORAL_TABLET | Freq: Two times a day (BID) | ORAL | 0 refills | Status: DC

## 2021-04-15 NOTE — Telephone Encounter (Signed)
Called pharmacy and Advair $45 a month and $135 for 90 days.  Called daughter Sharlett Iles and explained have samples and she is to call insurance company to see if any of the following are any cheaper, Breztri, Symbicort, Goff, [place] and let us know.

## 2021-04-15 NOTE — Telephone Encounter (Signed)
Received fax from [facility] for a refill on the pts. Furosemide 20 mg last apt was 04/08/21.

## 2021-04-18 ENCOUNTER — Telehealth (HOSPITAL_COMMUNITY): Payer: Self-pay

## 2021-04-18 ENCOUNTER — Other Ambulatory Visit: Payer: Self-pay | Admitting: Medical

## 2021-04-18 ENCOUNTER — Telehealth: Payer: Self-pay

## 2021-04-18 DIAGNOSIS — D75839 Thrombocytosis, unspecified: Secondary | ICD-10-CM | POA: Diagnosis not present

## 2021-04-18 DIAGNOSIS — F40231 Fear of injections and transfusions: Secondary | ICD-10-CM | POA: Diagnosis not present

## 2021-04-18 DIAGNOSIS — Z87891 Personal history of nicotine dependence: Secondary | ICD-10-CM | POA: Diagnosis not present

## 2021-04-18 DIAGNOSIS — E559 Vitamin D deficiency, unspecified: Secondary | ICD-10-CM | POA: Diagnosis not present

## 2021-04-18 DIAGNOSIS — I129 Hypertensive chronic kidney disease with stage 1 through stage 4 chronic kidney disease, or unspecified chronic kidney disease: Secondary | ICD-10-CM | POA: Diagnosis not present

## 2021-04-18 DIAGNOSIS — N1831 Chronic kidney disease, stage 3a: Secondary | ICD-10-CM | POA: Diagnosis not present

## 2021-04-18 DIAGNOSIS — Z7982 Long term (current) use of aspirin: Secondary | ICD-10-CM | POA: Diagnosis not present

## 2021-04-18 DIAGNOSIS — D45 Polycythemia vera: Secondary | ICD-10-CM | POA: Diagnosis not present

## 2021-04-18 DIAGNOSIS — D72829 Elevated white blood cell count, unspecified: Secondary | ICD-10-CM | POA: Diagnosis not present

## 2021-04-18 MED ORDER — FUROSEMIDE 80 MG PO TABS: 80.0000 mg | ORAL_TABLET | Freq: Two times a day (BID) | ORAL | 0 refills | Status: DC

## 2021-04-18 NOTE — Telephone Encounter (Signed)
Spoke to pt's daughter about medication and will defer to cardiology in the future

## 2021-04-18 NOTE — Telephone Encounter (Signed)
Spoke with the patient's daughter per DPR. She stated that she would have him here for his test. The patient has a needle phobia. I reassured her that we would be patient and take good care of him. S.Breanda Greenlaw EMTP

## 2021-04-18 NOTE — Telephone Encounter (Signed)
Pt. Wife called LM stating that he needs a refill on his Lasix but needs to changed to correct amount. It was filled on Friday but at 2 tablets daily. On the message she stated he was taking 4 tablets daily. If we could change that on the prescription.

## 2021-04-20 ENCOUNTER — Telehealth: Payer: Self-pay | Admitting: Medical

## 2021-04-20 DIAGNOSIS — Z87891 Personal history of nicotine dependence: Secondary | ICD-10-CM | POA: Diagnosis not present

## 2021-04-20 DIAGNOSIS — I129 Hypertensive chronic kidney disease with stage 1 through stage 4 chronic kidney disease, or unspecified chronic kidney disease: Secondary | ICD-10-CM | POA: Diagnosis not present

## 2021-04-20 DIAGNOSIS — D45 Polycythemia vera: Secondary | ICD-10-CM | POA: Diagnosis not present

## 2021-04-20 DIAGNOSIS — E559 Vitamin D deficiency, unspecified: Secondary | ICD-10-CM | POA: Diagnosis not present

## 2021-04-20 DIAGNOSIS — Z7982 Long term (current) use of aspirin: Secondary | ICD-10-CM | POA: Diagnosis not present

## 2021-04-20 DIAGNOSIS — F40231 Fear of injections and transfusions: Secondary | ICD-10-CM | POA: Diagnosis not present

## 2021-04-20 DIAGNOSIS — D72829 Elevated white blood cell count, unspecified: Secondary | ICD-10-CM | POA: Diagnosis not present

## 2021-04-20 DIAGNOSIS — N1831 Chronic kidney disease, stage 3a: Secondary | ICD-10-CM | POA: Diagnosis not present

## 2021-04-20 DIAGNOSIS — D75839 Thrombocytosis, unspecified: Secondary | ICD-10-CM | POA: Diagnosis not present

## 2021-04-20 MED ORDER — FUROSEMIDE 80 MG PO TABS: 80.0000 mg | ORAL_TABLET | Freq: Two times a day (BID) | ORAL | 0 refills | Status: DC

## 2021-04-20 NOTE — Telephone Encounter (Signed)
Spoke to pt's daughter and she still has pt on '40mg'$  2 x 2 times a day. She has yet to received 80 mg so I sent to local pharmacy for her to pick up. She is almost out of medication

## 2021-04-20 NOTE — Telephone Encounter (Signed)
I received another [facility] pharmacy clarification request.  The last time I saw him he was on Lasix 40 mg, 2 tablets in the morning, 1 tablet in the afternoon.  When I recently sent a refill the family called back and said he was actually taking 2 tablets twice daily.  I am not sure if cardiology increase the afternoon dose or not   Nevertheless, I recently refilled the medication furosemide Lasix to [facility] for 80 mg twice daily which would be the same as 40 mg, 2 tablets twice daily.  By using 80 mg 1 tablet twice daily he has to take this pill   Please call the family.  If he is in fact using 40 mg, 2 tablets twice daily, then lets change the prescription to less pills at 80 mg twice daily which is what I sent to [facility]  Please call [employer] and fax the form verifying this

## 2021-04-21 ENCOUNTER — Ambulatory Visit (HOSPITAL_COMMUNITY): Payer: Medicare HMO | Attending: Internal Medicine

## 2021-04-21 ENCOUNTER — Other Ambulatory Visit: Payer: Self-pay

## 2021-04-21 DIAGNOSIS — I10 Essential (primary) hypertension: Secondary | ICD-10-CM | POA: Diagnosis not present

## 2021-04-21 DIAGNOSIS — R0600 Dyspnea, unspecified: Secondary | ICD-10-CM | POA: Diagnosis not present

## 2021-04-21 DIAGNOSIS — I5040 Unspecified combined systolic (congestive) and diastolic (congestive) heart failure: Secondary | ICD-10-CM | POA: Insufficient documentation

## 2021-04-21 LAB — MYOCARDIAL PERFUSION IMAGING
LV dias vol: 177 mL (ref 62–150)
LV sys vol: 140 mL
Nuc Stress EF: 21 %
Peak HR: 78 {beats}/min
Rest HR: 77 {beats}/min
Rest Nuclear Isotope Dose: 10.9 mCi
SDS: 2
SRS: 0
SSS: 2
ST Depression (mm): 0 mm
Stress Nuclear Isotope Dose: 30.7 mCi
TID: 0.98

## 2021-04-21 MED ORDER — TECHNETIUM TC 99M TETROFOSMIN IV KIT
10.9000 | PACK | Freq: Once | INTRAVENOUS | Status: AC | PRN
  Administered 2021-04-21: 10.9 via INTRAVENOUS
  Filled 2021-04-21: qty 11

## 2021-04-21 MED ORDER — TECHNETIUM TC 99M TETROFOSMIN IV KIT
30.7000 | PACK | Freq: Once | INTRAVENOUS | Status: AC | PRN
  Administered 2021-04-21: 30.7 via INTRAVENOUS
  Filled 2021-04-21: qty 31

## 2021-04-21 MED ORDER — REGADENOSON 0.4 MG/5ML IV SOLN
0.4000 mg | Freq: Once | INTRAVENOUS | Status: AC
Start: 2021-04-21 — End: 2021-04-21
  Administered 2021-04-21: 0.4 mg via INTRAVENOUS

## 2021-04-22 ENCOUNTER — Telehealth: Payer: Self-pay | Admitting: Cardiovascular Disease

## 2021-04-22 NOTE — Telephone Encounter (Signed)
Per Dr. Acie Fredrickson, "No ischemia and no evidence of infarction Cont medical therapy"   Patient's daughter (DPR) wants to know if there is anything patient can do or take to help. Patient complaining of being extremely weak and low energy. She stated patient is tired and hardly getting out of bed. Will forward to Dr. Acie Fredrickson for advisement. Patient's daughter stated that a mychart message could be sent as well.

## 2021-04-22 NOTE — Telephone Encounter (Signed)
Will send Dr. Elmarie Shiley response through [place] as requested by patient's daughter.

## 2021-04-22 NOTE — Telephone Encounter (Signed)
Pt is calling in regards to the results for his Stress Test on 04/21/21

## 2021-04-27 ENCOUNTER — Ambulatory Visit: Payer: Medicare HMO | Admitting: Cardiovascular Disease

## 2021-04-27 DIAGNOSIS — D45 Polycythemia vera: Secondary | ICD-10-CM | POA: Diagnosis not present

## 2021-04-27 DIAGNOSIS — N1831 Chronic kidney disease, stage 3a: Secondary | ICD-10-CM | POA: Diagnosis not present

## 2021-04-27 DIAGNOSIS — Z87891 Personal history of nicotine dependence: Secondary | ICD-10-CM | POA: Diagnosis not present

## 2021-04-27 DIAGNOSIS — E559 Vitamin D deficiency, unspecified: Secondary | ICD-10-CM | POA: Diagnosis not present

## 2021-04-27 DIAGNOSIS — I129 Hypertensive chronic kidney disease with stage 1 through stage 4 chronic kidney disease, or unspecified chronic kidney disease: Secondary | ICD-10-CM | POA: Diagnosis not present

## 2021-04-27 DIAGNOSIS — Z7982 Long term (current) use of aspirin: Secondary | ICD-10-CM | POA: Diagnosis not present

## 2021-04-27 DIAGNOSIS — D75839 Thrombocytosis, unspecified: Secondary | ICD-10-CM | POA: Diagnosis not present

## 2021-04-27 DIAGNOSIS — D72829 Elevated white blood cell count, unspecified: Secondary | ICD-10-CM | POA: Diagnosis not present

## 2021-04-27 DIAGNOSIS — F40231 Fear of injections and transfusions: Secondary | ICD-10-CM | POA: Diagnosis not present

## 2021-05-06 DIAGNOSIS — D72829 Elevated white blood cell count, unspecified: Secondary | ICD-10-CM | POA: Diagnosis not present

## 2021-05-06 DIAGNOSIS — N1831 Chronic kidney disease, stage 3a: Secondary | ICD-10-CM | POA: Diagnosis not present

## 2021-05-06 DIAGNOSIS — E559 Vitamin D deficiency, unspecified: Secondary | ICD-10-CM | POA: Diagnosis not present

## 2021-05-06 DIAGNOSIS — D45 Polycythemia vera: Secondary | ICD-10-CM | POA: Diagnosis not present

## 2021-05-06 DIAGNOSIS — Z87891 Personal history of nicotine dependence: Secondary | ICD-10-CM | POA: Diagnosis not present

## 2021-05-06 DIAGNOSIS — Z7982 Long term (current) use of aspirin: Secondary | ICD-10-CM | POA: Diagnosis not present

## 2021-05-06 DIAGNOSIS — D75839 Thrombocytosis, unspecified: Secondary | ICD-10-CM | POA: Diagnosis not present

## 2021-05-06 DIAGNOSIS — F40231 Fear of injections and transfusions: Secondary | ICD-10-CM | POA: Diagnosis not present

## 2021-05-06 DIAGNOSIS — I129 Hypertensive chronic kidney disease with stage 1 through stage 4 chronic kidney disease, or unspecified chronic kidney disease: Secondary | ICD-10-CM | POA: Diagnosis not present

## 2021-05-11 DIAGNOSIS — D75839 Thrombocytosis, unspecified: Secondary | ICD-10-CM | POA: Diagnosis not present

## 2021-05-11 DIAGNOSIS — Z7982 Long term (current) use of aspirin: Secondary | ICD-10-CM | POA: Diagnosis not present

## 2021-05-11 DIAGNOSIS — I129 Hypertensive chronic kidney disease with stage 1 through stage 4 chronic kidney disease, or unspecified chronic kidney disease: Secondary | ICD-10-CM | POA: Diagnosis not present

## 2021-05-11 DIAGNOSIS — D72829 Elevated white blood cell count, unspecified: Secondary | ICD-10-CM | POA: Diagnosis not present

## 2021-05-11 DIAGNOSIS — D45 Polycythemia vera: Secondary | ICD-10-CM | POA: Diagnosis not present

## 2021-05-11 DIAGNOSIS — N1831 Chronic kidney disease, stage 3a: Secondary | ICD-10-CM | POA: Diagnosis not present

## 2021-05-11 DIAGNOSIS — E559 Vitamin D deficiency, unspecified: Secondary | ICD-10-CM | POA: Diagnosis not present

## 2021-05-11 DIAGNOSIS — F40231 Fear of injections and transfusions: Secondary | ICD-10-CM | POA: Diagnosis not present

## 2021-05-11 DIAGNOSIS — Z87891 Personal history of nicotine dependence: Secondary | ICD-10-CM | POA: Diagnosis not present

## 2021-05-13 DIAGNOSIS — D75839 Thrombocytosis, unspecified: Secondary | ICD-10-CM | POA: Diagnosis not present

## 2021-05-13 DIAGNOSIS — D45 Polycythemia vera: Secondary | ICD-10-CM | POA: Diagnosis not present

## 2021-05-13 DIAGNOSIS — D72829 Elevated white blood cell count, unspecified: Secondary | ICD-10-CM | POA: Diagnosis not present

## 2021-05-13 DIAGNOSIS — Z87891 Personal history of nicotine dependence: Secondary | ICD-10-CM | POA: Diagnosis not present

## 2021-05-13 DIAGNOSIS — E559 Vitamin D deficiency, unspecified: Secondary | ICD-10-CM | POA: Diagnosis not present

## 2021-05-13 DIAGNOSIS — N1831 Chronic kidney disease, stage 3a: Secondary | ICD-10-CM | POA: Diagnosis not present

## 2021-05-13 DIAGNOSIS — I129 Hypertensive chronic kidney disease with stage 1 through stage 4 chronic kidney disease, or unspecified chronic kidney disease: Secondary | ICD-10-CM | POA: Diagnosis not present

## 2021-05-13 DIAGNOSIS — Z7982 Long term (current) use of aspirin: Secondary | ICD-10-CM | POA: Diagnosis not present

## 2021-05-13 DIAGNOSIS — F40231 Fear of injections and transfusions: Secondary | ICD-10-CM | POA: Diagnosis not present

## 2021-05-23 DIAGNOSIS — D72829 Elevated white blood cell count, unspecified: Secondary | ICD-10-CM | POA: Diagnosis not present

## 2021-05-23 DIAGNOSIS — I129 Hypertensive chronic kidney disease with stage 1 through stage 4 chronic kidney disease, or unspecified chronic kidney disease: Secondary | ICD-10-CM | POA: Diagnosis not present

## 2021-05-23 DIAGNOSIS — E559 Vitamin D deficiency, unspecified: Secondary | ICD-10-CM | POA: Diagnosis not present

## 2021-05-23 DIAGNOSIS — Z7982 Long term (current) use of aspirin: Secondary | ICD-10-CM | POA: Diagnosis not present

## 2021-05-23 DIAGNOSIS — D45 Polycythemia vera: Secondary | ICD-10-CM | POA: Diagnosis not present

## 2021-05-23 DIAGNOSIS — Z87891 Personal history of nicotine dependence: Secondary | ICD-10-CM | POA: Diagnosis not present

## 2021-05-23 DIAGNOSIS — F40231 Fear of injections and transfusions: Secondary | ICD-10-CM | POA: Diagnosis not present

## 2021-05-23 DIAGNOSIS — D75839 Thrombocytosis, unspecified: Secondary | ICD-10-CM | POA: Diagnosis not present

## 2021-05-23 DIAGNOSIS — N1831 Chronic kidney disease, stage 3a: Secondary | ICD-10-CM | POA: Diagnosis not present

## 2021-05-24 ENCOUNTER — Telehealth: Payer: Self-pay | Admitting: Cardiovascular Disease

## 2021-05-24 DIAGNOSIS — I5040 Unspecified combined systolic (congestive) and diastolic (congestive) heart failure: Secondary | ICD-10-CM

## 2021-05-24 MED ORDER — POTASSIUM CHLORIDE ER 10 MEQ PO TBCR: 10.0000 meq | EXTENDED_RELEASE_TABLET | Freq: Every day | ORAL | 3 refills | Status: DC

## 2021-05-24 MED ORDER — FUROSEMIDE 40 MG PO TABS: 40.0000 mg | ORAL_TABLET | Freq: Every day | ORAL | 3 refills | Status: DC

## 2021-05-24 NOTE — Telephone Encounter (Signed)
Patient's daughter returning call. 

## 2021-05-24 NOTE — Telephone Encounter (Signed)
Patient's daughter returning call. She says if she does not answer to leave a detailed voicemail.

## 2021-05-24 NOTE — Telephone Encounter (Signed)
Nahser, Wonda Cheng, MD Ok to reduce his lasix to 40 mg a day and Kdur to 10 meq a day   BMP in 2-3 weeks.   Keep an eye on his weights   PN   Gave recommended medication changes to [place]. Made lab appointment.  Verbalized understanding.

## 2021-05-24 NOTE — Telephone Encounter (Signed)
Left message to call back  

## 2021-05-24 NOTE — Telephone Encounter (Signed)
Pt c/o medication issue:  1. Name of Medication: furosemide (LASIX) 80 MG tablet  2. How are you currently taking this medication (dosage and times per day)? Take 1 tablet (80 mg total) by mouth 2 (two) times daily.  3. Are you having a reaction (difficulty breathing--STAT)? no  4. What is your medication issue? Patient's daughter wants to know if he can scale bad on the medication because he dont have fluids like that anymore.  Also around the patient nail be he dark and little concerning unsure of where that is coming from. Patient's daughter ask if she doesn't answer please leave vm

## 2021-05-24 NOTE — Telephone Encounter (Signed)
The patients daughter is requesting to reduce lasix. Stating that his weights have been around ~141 pounds for the past two weeks. He is feeling much better and getting around much better as well. At last OV patient was 165 on Aug 04/12/21. Current BP 120/70 HR 78. Will forward to Dr. Acie Fredrickson for advisement.

## 2021-05-26 DIAGNOSIS — E559 Vitamin D deficiency, unspecified: Secondary | ICD-10-CM | POA: Diagnosis not present

## 2021-05-26 DIAGNOSIS — N1831 Chronic kidney disease, stage 3a: Secondary | ICD-10-CM | POA: Diagnosis not present

## 2021-05-26 DIAGNOSIS — D72829 Elevated white blood cell count, unspecified: Secondary | ICD-10-CM | POA: Diagnosis not present

## 2021-05-26 DIAGNOSIS — F40231 Fear of injections and transfusions: Secondary | ICD-10-CM | POA: Diagnosis not present

## 2021-05-26 DIAGNOSIS — Z7982 Long term (current) use of aspirin: Secondary | ICD-10-CM | POA: Diagnosis not present

## 2021-05-26 DIAGNOSIS — Z87891 Personal history of nicotine dependence: Secondary | ICD-10-CM | POA: Diagnosis not present

## 2021-05-26 DIAGNOSIS — I129 Hypertensive chronic kidney disease with stage 1 through stage 4 chronic kidney disease, or unspecified chronic kidney disease: Secondary | ICD-10-CM | POA: Diagnosis not present

## 2021-05-26 DIAGNOSIS — D45 Polycythemia vera: Secondary | ICD-10-CM | POA: Diagnosis not present

## 2021-05-26 DIAGNOSIS — D75839 Thrombocytosis, unspecified: Secondary | ICD-10-CM | POA: Diagnosis not present

## 2021-06-10 ENCOUNTER — Other Ambulatory Visit: Payer: Medicare HMO

## 2021-06-27 ENCOUNTER — Other Ambulatory Visit: Payer: Self-pay | Admitting: Medical

## 2021-07-13 ENCOUNTER — Other Ambulatory Visit: Payer: Self-pay | Admitting: Medical

## 2021-07-29 ENCOUNTER — Ambulatory Visit (INDEPENDENT_AMBULATORY_CARE_PROVIDER_SITE_OTHER): Payer: Medicare HMO

## 2021-07-29 VITALS — Ht 66.0 in | Wt 150.0 lb

## 2021-07-29 DIAGNOSIS — Z0000 Encounter for general adult medical examination without abnormal findings: Secondary | ICD-10-CM | POA: Diagnosis not present

## 2021-07-29 NOTE — Patient Instructions (Signed)
James Bryant , Thank you for taking time to come for your Medicare Wellness Visit. I appreciate your ongoing commitment to your health goals. Please review the following plan we discussed and let me know if I can assist you in the future.   Screening recommendations/referrals: Colonoscopy: not required Recommended yearly ophthalmology/optometry visit for glaucoma screening and checkup Recommended yearly dental visit for hygiene and checkup  Vaccinations: Influenza vaccine: decline Pneumococcal vaccine: decline Tdap vaccine: decline Shingles vaccine: decline   Covid-19:  decline  Advanced directives: Please bring a copy of your POA (Power of Attorney) and/or Living Will to your next appointment.   Conditions/risks identified: none  Next appointment: Follow up in one year for your annual wellness visit.   Preventive Care 32 Years and Older, Male Preventive care refers to lifestyle choices and visits with your health care provider that can promote health and wellness. What does preventive care include? A yearly physical exam. This is also called an annual well check. Dental exams once or twice a year. Routine eye exams. Ask your health care provider how often you should have your eyes checked. Personal lifestyle choices, including: Daily care of your teeth and gums. Regular physical activity. Eating a healthy diet. Avoiding tobacco and drug use. Limiting alcohol use. Practicing safe sex. Taking low doses of aspirin every day. Taking vitamin and mineral supplements as recommended by your health care provider. What happens during an annual well check? The services and screenings done by your health care provider during your annual well check will depend on your age, overall health, lifestyle risk factors, and family history of disease. Counseling  Your health care provider may ask you questions about your: Alcohol use. Tobacco use. Drug use. Emotional well-being. Home and  relationship well-being. Sexual activity. Eating habits. History of falls. Memory and ability to understand (cognition). Work and work Statistician. Screening  You may have the following tests or measurements: Height, weight, and BMI. Blood pressure. Lipid and cholesterol levels. These may be checked every 5 years, or more frequently if you are over 76 years old. Skin check. Lung cancer screening. You may have this screening every year starting at age 58 if you have a 30-pack-year history of smoking and currently smoke or have quit within the past 15 years. Fecal occult blood test (FOBT) of the stool. You may have this test every year starting at age 35. Flexible sigmoidoscopy or colonoscopy. You may have a sigmoidoscopy every 5 years or a colonoscopy every 10 years starting at age 13. Prostate cancer screening. Recommendations will vary depending on your family history and other risks. Hepatitis C blood test. Hepatitis B blood test. Sexually transmitted disease (STD) testing. Diabetes screening. This is done by checking your blood sugar (glucose) after you have not eaten for a while (fasting). You may have this done every 1-3 years. Abdominal aortic aneurysm (AAA) screening. You may need this if you are a current or former smoker. Osteoporosis. You may be screened starting at age 63 if you are at high risk. Talk with your health care provider about your test results, treatment options, and if necessary, the need for more tests. Vaccines  Your health care provider may recommend certain vaccines, such as: Influenza vaccine. This is recommended every year. Tetanus, diphtheria, and acellular pertussis (Tdap, Td) vaccine. You may need a Td booster every 10 years. Zoster vaccine. You may need this after age 80. Pneumococcal 13-valent conjugate (PCV13) vaccine. One dose is recommended after age 57. Pneumococcal polysaccharide (PPSV23) vaccine.  One dose is recommended after age 25. Talk to your  health care provider about which screenings and vaccines you need and how often you need them. This information is not intended to replace advice given to you by your health care provider. Make sure you discuss any questions you have with your health care provider. Document Released: 09/03/2015 Document Revised: 04/26/2016 Document Reviewed: 06/08/2015 Elsevier Interactive Patient Education  2017 [place] Prevention in the Home Falls can cause injuries. They can happen to people of all ages. There are many things you can do to make your home safe and to help prevent falls. What can I do on the outside of my home? Regularly fix the edges of walkways and driveways and fix any cracks. Remove anything that might make you trip as you walk through a door, such as a raised step or threshold. Trim any bushes or trees on the path to your home. Use bright outdoor lighting. Clear any walking paths of anything that might make someone trip, such as rocks or tools. Regularly check to see if handrails are loose or broken. Make sure that both sides of any steps have handrails. Any raised decks and porches should have guardrails on the edges. Have any leaves, snow, or ice cleared regularly. Use sand or salt on walking paths during winter. Clean up any spills in your garage right away. This includes oil or grease spills. What can I do in the bathroom? Use night lights. Install grab bars by the toilet and in the tub and shower. Do not use towel bars as grab bars. Use non-skid mats or decals in the tub or shower. If you need to sit down in the shower, use a plastic, non-slip stool. Keep the floor dry. Clean up any water that spills on the floor as soon as it happens. Remove soap buildup in the tub or shower regularly. Attach bath mats securely with double-sided non-slip rug tape. Do not have throw rugs and other things on the floor that can make you trip. What can I do in the bedroom? Use night  lights. Make sure that you have a light by your bed that is easy to reach. Do not use any sheets or blankets that are too big for your bed. They should not hang down onto the floor. Have a firm chair that has side arms. You can use this for support while you get dressed. Do not have throw rugs and other things on the floor that can make you trip. What can I do in the kitchen? Clean up any spills right away. Avoid walking on wet floors. Keep items that you use a lot in easy-to-reach places. If you need to reach something above you, use a strong step stool that has a grab bar. Keep electrical cords out of the way. Do not use floor polish or wax that makes floors slippery. If you must use wax, use non-skid floor wax. Do not have throw rugs and other things on the floor that can make you trip. What can I do with my stairs? Do not leave any items on the stairs. Make sure that there are handrails on both sides of the stairs and use them. Fix handrails that are broken or loose. Make sure that handrails are as long as the stairways. Check any carpeting to make sure that it is firmly attached to the stairs. Fix any carpet that is loose or worn. Avoid having throw rugs at the top or bottom of  the stairs. If you do have throw rugs, attach them to the floor with carpet tape. Make sure that you have a light switch at the top of the stairs and the bottom of the stairs. If you do not have them, ask someone to add them for you. What else can I do to help prevent falls? Wear shoes that: Do not have high heels. Have rubber bottoms. Are comfortable and fit you well. Are closed at the toe. Do not wear sandals. If you use a stepladder: Make sure that it is fully opened. Do not climb a closed stepladder. Make sure that both sides of the stepladder are locked into place. Ask someone to hold it for you, if possible. Clearly mark and make sure that you can see: Any grab bars or handrails. First and last  steps. Where the edge of each step is. Use tools that help you move around (mobility aids) if they are needed. These include: Canes. Walkers. Scooters. Crutches. Turn on the lights when you go into a dark area. Replace any light bulbs as soon as they burn out. Set up your furniture so you have a clear path. Avoid moving your furniture around. If any of your floors are uneven, fix them. If there are any pets around you, be aware of where they are. Review your medicines with your doctor. Some medicines can make you feel dizzy. This can increase your chance of falling. Ask your doctor what other things that you can do to help prevent falls. This information is not intended to replace advice given to you by your health care provider. Make sure you discuss any questions you have with your health care provider. Document Released: 06/03/2009 Document Revised: 01/13/2016 Document Reviewed: 09/11/2014 Elsevier Interactive Patient Education  2017 [employer].

## 2021-07-29 NOTE — Progress Notes (Signed)
I connected with  James Bryant today via telehealth video enabled device and verified that I am speaking with the correct person using two identifiers.   Location: Patient: home Provider: work  Persons participating in virtual visit: James Bryant, James Bryant (daughter), James Durand LPN  I discussed the limitations, risks, security and privacy concerns of performing an evaluation and management service by video and the availability of in person appointments. The patient expressed understanding and agreed to proceed.   Some vital signs may be absent or patient reported.     Subjective:   James Bryant is a 75 y.o. male who presents for Medicare Annual/Subsequent preventive examination.  Review of Systems     Cardiac Risk Factors include: advanced age (>40men, >45 women);hypertension;male gender     Objective:    Today's Vitals   07/29/21 1116  Weight: 150 lb (68 kg)  Height: 5\' 6"  (1.676 m)   Body mass index is 24.21 kg/m.  Advanced Directives 07/29/2021  Does Patient Have a Medical Advance Directive? Yes  Type of Paramedic of [place];Living will  Copy of [place] in Chart? No - copy requested    Current Medications (verified) Outpatient Encounter Medications as of 07/29/2021  Medication Sig   aspirin EC 81 MG tablet Take 1 tablet (81 mg total) by mouth daily.   carvedilol (COREG) 6.25 MG tablet Take 1 tablet (6.25 mg total) by mouth 2 (two) times daily. Please make yearly appt with Dr. Acie Fredrickson for July before anymore refills. 1st attempt   ferrous gluconate (FERGON) 324 MG tablet TAKE 1 TABLET (324 MG TOTAL) BY MOUTH DAILY WITH BREAKFAST.   furosemide (LASIX) 40 MG tablet Take 1 tablet (40 mg total) by mouth daily.   hydroxyurea (HYDREA) 500 MG capsule Take 1 capsule (500 mg total) by mouth 2 (two) times daily. May take with food to minimize GI side effects.   latanoprost (XALATAN) 0.005 % ophthalmic solution Place 1  drop into both eyes 2 (two) times a day.   Multiple Vitamins-Minerals (MEGA MULTIVITAMIN FOR MEN PO) Take 1 tablet by mouth daily.   potassium chloride (KLOR-CON) 10 MEQ tablet Take 1 tablet (10 mEq total) by mouth daily.   valsartan (DIOVAN) 160 MG tablet Take 1 tablet (160 mg total) by mouth daily.   fluticasone furoate-vilanterol (BREO ELLIPTA) 200-25 MCG/INH AEPB Inhale 1 puff into the lungs daily. (Patient not taking: Reported on 07/29/2021)   fluticasone-salmeterol (ADVAIR) 100-50 MCG/ACT AEPB INHALE 1 PUFF BY MOUTH INTO THE LUNGS TWICE DAILY (Patient not taking: Reported on 07/29/2021)   No facility-administered encounter medications on file as of 07/29/2021.    Allergies (verified) Patient has no known allergies.   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 No pertinent surgical history. Family History  Problem Relation Age of Onset   Heart disease Mother    Hypertension Mother    Social History   Socioeconomic History   Marital status: Married    Spouse name: Not on file   Number of children: Not on file   Years of education: Not on file   Highest education level: Not on file  Occupational History   Not on file  Tobacco Use   Smoking status: Former  Smokeless tobacco: Never  Vaping Use   Vaping Use: Never used  Substance and Sexual Activity   Alcohol use: No    Comment: stopped many years ago    Drug use: Never   Sexual activity: Not on file  Other Topics Concern   Not on file  Social History Narrative   Married. Retired Administrator. Stopped smoking at age 67, smoked since age 89. Stopped alcohol use at age 28. Moved here from [place], [place] recently.    Extreme fear of needles.    Social  Determinants of Health   Financial Resource Strain: Low Risk    Difficulty of Paying Living Expenses: Not hard at all  Food Insecurity: No Food Insecurity   Worried About Charity fundraiser in the Last Year: Never true   [place] in the Last Year: Never true  Transportation Needs: No Transportation Needs   Lack of Transportation (Medical): No   Lack of Transportation (Non-Medical): No  Physical Activity: Insufficiently Active   Days of Exercise per Week: 7 days   Minutes of Exercise per Session: 10 min  Stress: No Stress Concern Present   Feeling of Stress : Not at all  Social Connections: Not on file    Tobacco Counseling Counseling given: Not Answered   Clinical Intake:  Pre-visit preparation completed: Yes  Pain : No/denies pain     Nutritional Status: BMI of 19-24  Normal Nutritional Risks: None Diabetes: No  How often do you need to have someone help you when you read instructions, pamphlets, or other written materials from your doctor or pharmacy?: 1 - Never  Diabetic? no  Interpreter Needed?: No  Information entered by :: NAllen LPN   Activities of Daily Living In your present state of health, do you have any difficulty performing the following activities: 07/29/2021  Hearing? N  Vision? N  Difficulty concentrating or making decisions? N  Walking or climbing stairs? N  Dressing or bathing? N  Doing errands, shopping? Y  Preparing Food and eating ? N  Using the Toilet? N  In the past six months, have you accidently leaked urine? N  Do you have problems with loss of bowel control? N  Managing your Medications? Y  Managing your Finances? Y  Housekeeping or managing your Housekeeping? Y  Some recent data might be hidden    Patient Care Team: Davy Pique as PCP - General (Family Medicine) James, Wonda Cheng, MD as PCP - Cardiology (Cardiology)  Indicate any recent Medical Services you may have received from other than [facility] providers  in the past year (date may be approximate).     Assessment:   This is a routine wellness examination for [name].  Hearing/Vision screen Vision Screening - Comments:: Regular eye exams, [facility]  Dietary issues and exercise activities discussed: Current Exercise Habits: The patient does not participate in regular exercise at present   Goals Addressed             This Visit's Progress    Patient Stated       07/29/2021, no goals       Depression Screen PHQ 2/9 Scores 07/29/2021 02/28/2021 01/08/2020  PHQ - 2 Score 0 0 0    Fall Risk Fall Risk  07/29/2021 02/28/2021 01/08/2020  Falls in the past year? 0 0 0  Number falls in past yr: - 0 0  Injury with Fall? - 0 -  Risk for fall due to :  Medication side effect No Fall Risks -  Follow up Falls evaluation completed;Education provided;Falls prevention discussed Falls evaluation completed -    FALL RISK PREVENTION PERTAINING TO THE HOME:  Any stairs in or around the home? Yes  If so, are there any without handrails? Yes  Home free of loose throw rugs in walkways, pet beds, electrical cords, etc? Yes  Adequate lighting in your home to reduce risk of falls? Yes   ASSISTIVE DEVICES UTILIZED TO PREVENT FALLS:  Life alert? No  Use of a cane, walker or w/c? Yes  Grab bars in the bathroom? No  Shower chair or bench in shower? No  Elevated toilet seat or a handicapped toilet? No   TIMED UP AND GO:  Was the test performed? No .      Cognitive Function:     6CIT Screen 07/29/2021  What Year? 0 points  What month? 0 points  What time? 3 points  Count back from 20 0 points  Months in reverse 4 points  Repeat phrase 2 points  Total Score 9    Immunizations  There is no immunization history on file for this patient.  TDAP status: Due, Education has been provided regarding the importance of this vaccine. Advised may receive this vaccine at local pharmacy or Health Dept. Aware to provide a copy of the vaccination  record if obtained from local pharmacy or Health Dept. Verbalized acceptance and understanding.  Flu Vaccine status: Declined, Education has been provided regarding the importance of this vaccine but patient still declined. Advised may receive this vaccine at local pharmacy or Health Dept. Aware to provide a copy of the vaccination record if obtained from local pharmacy or Health Dept. Verbalized acceptance and understanding.  Pneumococcal vaccine status: Declined,  Education has been provided regarding the importance of this vaccine but patient still declined. Advised may receive this vaccine at local pharmacy or Health Dept. Aware to provide a copy of the vaccination record if obtained from local pharmacy or Health Dept. Verbalized acceptance and understanding.   Covid-19 vaccine status: Completed vaccines  Qualifies for Shingles Vaccine? Yes   Zostavax completed No   Shingrix Completed?: No.    Education has been provided regarding the importance of this vaccine. Patient has been advised to call insurance company to determine out of pocket expense if they have not yet received this vaccine. Advised may also receive vaccine at local pharmacy or Health Dept. Verbalized acceptance and understanding.  Screening Tests Health Maintenance  Topic Date Due   COVID-19 Vaccine (1) Never done   Pneumonia Vaccine 24+ Years old (1 - PCV) Never done   TETANUS/TDAP  Never done   Zoster Vaccines- Shingrix (1 of 2) Never done   INFLUENZA VACCINE  Never done   Hepatitis C Screening  Completed   HPV VACCINES  Aged Out    Health Maintenance  Health Maintenance Due  Topic Date Due   COVID-19 Vaccine (1) Never done   Pneumonia Vaccine 63+ Years old (1 - PCV) Never done   TETANUS/TDAP  Never done   Zoster Vaccines- Shingrix (1 of 2) Never done   INFLUENZA VACCINE  Never done    Colorectal cancer screening: No longer required.   Lung Cancer Screening: (Low Dose CT Chest recommended if Age 59-80 years,  30 pack-year currently smoking OR have quit w/in 15years.) does not qualify.   Lung Cancer Screening Referral: no  Additional Screening:  Hepatitis C Screening: does qualify; Completed 02/12/2019  Vision Screening: Recommended  annual ophthalmology exams for early detection of glaucoma and other disorders of the eye. Is the patient up to date with their annual eye exam?  Yes  Who is the provider or what is the name of the office in which the patient attends annual eye exams? [facility] If pt is not established with a provider, would they like to be referred to a provider to establish care? No .   Dental Screening: Recommended annual dental exams for proper oral hygiene  Community Resource Referral / Chronic Care Management: CRR required this visit?  No   CCM required this visit?  No      Plan:     I have personally reviewed and noted the following in the patient's chart:   Medical and social history Use of alcohol, tobacco or illicit drugs  Current medications and supplements including opioid prescriptions. Patient is not currently taking opioid prescriptions. Functional ability and status Nutritional status Physical activity Advanced directives List of other physicians Hospitalizations, surgeries, and ER visits in previous 12 months Vitals Screenings to include cognitive, depression, and falls Referrals and appointments  In addition, I have reviewed and discussed with patient certain preventive protocols, quality metrics, and best practice recommendations. A written personalized care plan for preventive services as well as general preventive health recommendations were provided to patient.     Kellie Simmering, LPN   03/21/4480   Nurse Notes: none

## 2021-09-06 NOTE — Progress Notes (Deleted)
Cardiology Office Note:    Date:  09/06/2021   ID:  James, Bryant May 16, 1946, MRN [id number]  PCP:  Davy Pique  [facility] Cardiologist:  Mertie Moores, MD  [place] Electrophysiologist:  None   Chief Complaint: 5 months follow-up  History of Present Illness:    James Bryant is a 76 y.o. male with a hx of hypertension, polycythemia, nonischemic cardiomyopathy, CKD stage III and remote tobacco smoker presents for follow-up.  Echocardiogram July 2020 showed LV function of 25 to 30%, diffuse hypokinesis and impaired relaxation.  Suspected hypertensive cardiomyopathy.  Eventually underwent stress test September 2022 showing severely reduced LV function without evidence of ischemia or prior myocardial infraction.  Unable to add spironolactone due to elevated potassium.  Past Medical History:  Diagnosis Date   Elevated liver function tests 01/17/2019   Elevated serum creatinine 01/17/2019   Fear of needles 01/17/2019   Former smoker, stopped smoking many years ago 01/17/2019   Hypertension    Polycythemia 01/17/2019   Thrombocytosis 01/17/2019    No past surgical history on file.  Current Medications: No outpatient medications have been marked as taking for the 09/08/21 encounter (Appointment) with Leanor Kail, [place].     Allergies:   Patient has no known allergies.   Social History   Socioeconomic History   Marital status: Married    Spouse name: Not on file   Number of children: Not on file   Years of education: Not on file   Highest education level: Not on file  Occupational History   Not on file  Tobacco Use   Smoking status: Former   Smokeless tobacco: Never  Vaping Use   Vaping Use: Never used  Substance and Sexual Activity   Alcohol use: No    Comment: stopped many years ago    Drug use: Never   Sexual activity: Not on file  Other Topics Concern   Not on file  Social History Narrative   Married. Retired Administrator. Stopped  smoking at age 49, smoked since age 31. Stopped alcohol use at age 27. Moved here from [place], [place] recently.    Extreme fear of needles.    Social Determinants of Health   Financial Resource Strain: Low Risk    Difficulty of Paying Living Expenses: Not hard at all  Food Insecurity: No Food Insecurity   Worried About Charity fundraiser in the Last Year: Never true   [place] in the Last Year: Never true  Transportation Needs: No Transportation Needs   Lack of Transportation (Medical): No   Lack of Transportation (Non-Medical): No  Physical Activity: Insufficiently Active   Days of Exercise per Week: 7 days   Minutes of Exercise per Session: 10 min  Stress: No Stress Concern Present   Feeling of Stress : Not at all  Social Connections: Not on file     Family History: The patient's family history includes Heart disease in his mother; Hypertension in his mother.  ***  ROS:   Please see the history of present illness.    All other systems reviewed and are negative. ***  EKGs/Labs/Other Studies Reviewed:    The following studies were reviewed today:  Stress test 04/2021 Findings are consistent with no prior ischemia and no prior myocardial infarction. The study is high risk due to severely reduced EF.   Left ventricular function is abnormal. Global function is severely reduced. Nuclear stress EF: 21 %. The left ventricular  ejection fraction is severely decreased (<30%). End diastolic cavity size is moderately enlarged. End systolic cavity size is severely enlarged.   No ST deviation was noted.   LV perfusion is normal. There is no evidence of ischemia. There is no evidence of infarction.   Prior study not available for comparison.  Echo 02/2019 1. The left ventricle has severely reduced systolic function, with an  ejection fraction of 25-30%. The cavity size was normal. Left ventricular  diastolic Doppler parameters are consistent with impaired relaxation. Left   ventricular diffuse hypokinesis.   2. The right ventricle has normal systolic function. The cavity was  mildly enlarged. There is no increase in right ventricular wall thickness.   3. Left atrial size was mildly dilated.   4. Right atrial size was mildly dilated.   EKG:  EKG is *** ordered today.  The ekg ordered today demonstrates ***  Recent Labs: 02/28/2021: ALT 44; Hemoglobin 19.0; Platelets 865 04/12/2021: BUN 28; Creatinine, Ser 1.72; Potassium 3.8; Sodium 142  Recent Lipid Panel    Component Value Date/Time   CHOL 112 02/28/2021 1150   TRIG 60 02/28/2021 1150   HDL 38 (L) 02/28/2021 1150   CHOLHDL 2.9 02/28/2021 1150   LDLCALC 61 02/28/2021 1150     Risk Assessment/Calculations:   {Does this patient have ATRIAL FIBRILLATION?[phone]}   Physical Exam:    VS:  There were no vitals taken for this visit.    Wt Readings from Last 3 Encounters:  07/29/21 150 lb (68 kg)  04/21/21 165 lb (74.8 kg)  04/12/21 165 lb 9.6 oz (75.1 kg)     GEN: *** Well nourished, well developed in no acute distress HEENT: Normal NECK: No JVD; No carotid bruits LYMPHATICS: No lymphadenopathy CARDIAC: ***RRR, no murmurs, rubs, gallops RESPIRATORY:  Clear to auscultation without rales, wheezing or rhonchi  ABDOMEN: Soft, non-tender, non-distended MUSCULOSKELETAL:  No edema; No deformity  SKIN: Warm and dry NEUROLOGIC:  Alert and oriented x 3 PSYCHIATRIC:  Normal affect   ASSESSMENT AND PLAN:    Nonischemic cardiomyopathy/chronic combined CHF -Felt hypertensive mediated.  No evidence of ischemia or infarction on stress test September 2022. -Unable to add spironolactone due to hyperkalemia  2.  CKD stage III -Baseline creatinine 1.6-1.7  3.  Hypertension  Medication Adjustments/Labs and Tests Ordered: Current medicines are reviewed at length with the patient today.  Concerns regarding medicines are outlined above.  No orders of the defined types were placed in this  encounter.  No orders of the defined types were placed in this encounter.   There are no Patient Instructions on file for this visit.   Jarrett Soho, [place]  09/06/2021 6:32 PM    [name]

## 2021-09-08 ENCOUNTER — Ambulatory Visit: Payer: Medicare HMO | Admitting: Physician Assistant

## 2021-11-08 NOTE — Progress Notes (Signed)
? ?Cardiology Office Note   ? ?Date:  11/16/2021  ? ?ID:  James Bryant, DOB Jun 17, 1946, MRN [id number] ? ? ?PCP:  James Bryant, James Bryant ?  ?[place]  ?Cardiologist:  James Moores, James Bryant   ?Advanced Practice Provider:  No care team member to display ?Electrophysiologist:  None  ? ?[id number]}  ? ?Chief Complaint  ?Patient presents with  ? Follow-up  ? ? ?History of Present Illness:  ?James Bryant is a 76 y.o. male  with hx of HTN, polycythemia and remote tobacco smoker   ?Echo by PCP 03/05/19 showed LVEF of 25-30%, diffuse hypokinesis and impaired relaxation. NST 04/2019 no ischemia. ?  ?Last saw Dr. Acie Fredrickson 03/2021 and doing well.NST 04/21/22 no ischemia, severe LVD EF 21%. Called in and was ok'ed to reduce lasix to 40 mg daily. ? ?Patient comes in for f/u with his wife. Denies chest pain, palpations, dyspnea, edema. No regular exercise. Wife says  PCP increased lasix 80 mg bid because patient wasn't moving around-no swelling or dyspnea. On further questioning they aren't sure what he's taking and if he's still on valsartan. ? ? ?Past Medical History:  ?Diagnosis Date  ? Elevated liver function tests 01/17/2019  ? Elevated serum creatinine 01/17/2019  ? Fear of needles 01/17/2019  ? Former smoker, stopped smoking many years ago 01/17/2019  ? Hypertension   ? Polycythemia 01/17/2019  ? Thrombocytosis 01/17/2019  ? ? ?History reviewed. No pertinent surgical history. ? ?Current Medications: ?Current Meds  ?Medication Sig  ? aspirin EC 81 MG tablet Take 1 tablet (81 mg total) by mouth daily.  ? brimonidine (ALPHAGAN) 0.2 % ophthalmic solution Place 1 drop into both eyes daily.  ? carvedilol (COREG) 6.25 MG tablet Take 1 tablet (6.25 mg total) by mouth 2 (two) times daily. Please make yearly appt with Dr. Acie Fredrickson for July before anymore refills. 1st attempt  ? ferrous gluconate (FERGON) 324 MG tablet TAKE 1 TABLET (324 MG TOTAL) BY MOUTH DAILY WITH BREAKFAST.  ? fluticasone furoate-vilanterol (BREO  ELLIPTA) 200-25 MCG/INH AEPB Inhale 1 puff into the lungs daily.  ? fluticasone-salmeterol (ADVAIR) 100-50 MCG/ACT AEPB INHALE 1 PUFF BY MOUTH INTO THE LUNGS TWICE DAILY  ? furosemide (LASIX) 40 MG tablet Take 40 mg by mouth daily.  ? hydroxyurea (HYDREA) 500 MG capsule Take 1 capsule (500 mg total) by mouth 2 (two) times daily. May take with food to minimize GI side effects.  ? latanoprost (XALATAN) 0.005 % ophthalmic solution Place 1 drop into both eyes 2 (two) times a day.  ? Multiple Vitamins-Minerals (MEGA MULTIVITAMIN FOR MEN PO) Take 1 tablet by mouth daily.  ? potassium chloride (KLOR-CON) 10 MEQ tablet Take 1 tablet (10 mEq total) by mouth daily.  ? valsartan (DIOVAN) 160 MG tablet Take 160 mg by mouth daily.  ? [DISCONTINUED] valsartan (DIOVAN) 160 MG tablet Take 1 tablet (160 mg total) by mouth daily.  ?  ? ?Allergies:   Patient has no known allergies.  ? ?Social History  ? ?Socioeconomic History  ? Marital status: Married  ?  Spouse name: Not on file  ? Number of children: Not on file  ? Years of education: Not on file  ? Highest education level: Not on file  ?Occupational History  ? Not on file  ?Tobacco Use  ? Smoking status: Former  ? Smokeless tobacco: Never  ?Vaping Use  ? Vaping Use: Never used  ?Substance and Sexual Activity  ? Alcohol use: No  ?  Comment: stopped many years ago   ? Drug use: Never  ? Sexual activity: Not on file  ?Other Topics Concern  ? Not on file  ?Social History Narrative  ? Married. Retired Administrator. Stopped smoking at age 21, smoked since age 1. Stopped alcohol use at age 66. Moved here from [place], [place] recently.   ? Extreme fear of needles.   ? ?Social Determinants of Health  ? ?Financial Resource Strain: Low Risk   ? Difficulty of Paying Living Expenses: Not hard at all  ?Food Insecurity: No Food Insecurity  ? Worried About Charity fundraiser in the Last Year: Never true  ? Ran Out of Food in the Last Year: Never true  ?Transportation Needs: No Transportation  Needs  ? Lack of Transportation (Medical): No  ? Lack of Transportation (Non-Medical): No  ?Physical Activity: Insufficiently Active  ? Days of Exercise per Week: 7 days  ? Minutes of Exercise per Session: 10 min  ?Stress: No Stress Concern Present  ? Feeling of Stress : Not at all  ?Social Connections: Not on file  ?  ? ?Family History:  The patient's  family history includes Heart disease in his mother; Hypertension in his mother.  ? ?ROS:   ?Please see the history of present illness.    ?ROS All other systems reviewed and are negative. ? ? ?PHYSICAL EXAM:   ?VS:  BP 140/72   Pulse 69   Ht '5\' 6"'$  (1.676 m)   Wt 165 lb 12.8 oz (75.2 kg)   SpO2 97%   BMI 26.76 kg/m?   ?Physical Exam  ?GEN: Well nourished, well developed, in no acute distress  ?Neck: no JVD, carotid bruits, or masses ?Cardiac:RRR; 2/6 systolic murmur LSB ?Respiratory: decreased breath sounds with some rhonchi ?GI: soft, nontender, nondistended, + BS ?Ext: without cyanosis, clubbing, or edema, Good distal pulses bilaterally ?Neuro:  Alert and Oriented x 3, ?Psych: euthymic mood, full affect ? ?Wt Readings from Last 3 Encounters:  ?11/16/21 165 lb 12.8 oz (75.2 kg)  ?07/29/21 150 lb (68 kg)  ?04/21/21 165 lb (74.8 kg)  ?  ? ? ?Studies/Labs Reviewed:  ? ?EKG:  EKG is not ordered today.    ? ?Recent Labs: ?02/28/2021: ALT 44; Hemoglobin 19.0; Platelets 865 ?04/12/2021: BUN 28; Creatinine, Ser 1.72; Potassium 3.8; Sodium 142  ? ?Lipid Panel ?   ?Component Value Date/Time  ? CHOL 112 02/28/2021 1150  ? TRIG 60 02/28/2021 1150  ? HDL 38 (L) 02/28/2021 1150  ? CHOLHDL 2.9 02/28/2021 1150  ? [place] 61 02/28/2021 1150  ? ? ?Additional studies/ records that were reviewed today include:  ? ?NST 04/21/21 ? Findings are consistent with no prior ischemia and no prior myocardial infarction. The study is high risk due to severely reduced EF. ?  Left ventricular function is abnormal. Global function is severely reduced. Nuclear stress EF: 21 %. The left ventricular  ejection fraction is severely decreased (<30%). End diastolic cavity size is moderately enlarged. End systolic cavity size is severely enlarged. ?  No ST deviation was noted. ?  LV perfusion is normal. There is no evidence of ischemia. There is no evidence of infarction. ?  Prior study not available for comparison. ?NST 04/2019  Findings are consistent with no prior ischemia and no prior myocardial infarction. The study is high risk due to severely reduced EF. ?  Left ventricular function is abnormal. Global function is severely reduced. Nuclear stress EF: 21 %. The left ventricular ejection  fraction is severely decreased (<30%). End diastolic cavity size is moderately enlarged. End systolic cavity size is severely enlarged. ?  No ST deviation was noted. ?  LV perfusion is normal. There is no evidence of ischemia. There is no evidence of infarction. ?  Prior study not available for comparison. ? Echo 02/2019 ?IMPRESSIONS ?  ?  ? 1. The left ventricle has severely reduced systolic function, with an ejection fraction of 25-30%. The cavity size was normal. Left ventricular diastolic Doppler parameters are consistent with impaired relaxation. Left ventricular diffuse hypokinesis. ? 2. The right ventricle has normal systolic function. The cavity was mildly enlarged. There is no increase in right ventricular wall thickness. ? 3. Left atrial size was mildly dilated. ? 4. Right atrial size was mildly dilated. ? ? ?Risk Assessment/Calculations:   ?  ? ? ? ? ?ASSESSMENT:   ? ?1. NICM (nonischemic cardiomyopathy) ([place])   ?2. Chronic combined systolic and diastolic CHF (congestive heart failure) ([place])   ?3. Essential hypertension   ?4. Stage 3a chronic kidney disease ([place])   ? ? ? ?PLAN:  ?In order of problems listed above: ? ?NICM EF 25-30% echo 02/2019, NST no ishcemia 04/21/21-patient without symptoms but unsure of medications. Will check labs and verify what he's taking-daughter says he's on lasix 40 mg bid and  valsartan.pharmacy says lasix 40 mg once daily and valsartan 160 mg once daily. ? ?Chronic combined CHF-compensated today. ? ?HTN controlled ? ?CKD- Crt 1.72 03/2021 check today ? ?Shared Decision Making/Informed Consent   ?   ? ?

## 2021-11-16 ENCOUNTER — Ambulatory Visit: Payer: Medicare HMO | Admitting: Physician Assistant

## 2021-11-16 ENCOUNTER — Other Ambulatory Visit: Payer: Self-pay

## 2021-11-16 ENCOUNTER — Encounter: Payer: Self-pay | Admitting: Physician Assistant

## 2021-11-16 ENCOUNTER — Telehealth: Payer: Self-pay | Admitting: Nurse Practitioner

## 2021-11-16 VITALS — BP 140/72 | HR 69 | Ht 66.0 in | Wt 165.8 lb

## 2021-11-16 DIAGNOSIS — N1831 Chronic kidney disease, stage 3a: Secondary | ICD-10-CM | POA: Diagnosis not present

## 2021-11-16 DIAGNOSIS — I10 Essential (primary) hypertension: Secondary | ICD-10-CM | POA: Diagnosis not present

## 2021-11-16 DIAGNOSIS — I428 Other cardiomyopathies: Secondary | ICD-10-CM

## 2021-11-16 DIAGNOSIS — I5042 Chronic combined systolic (congestive) and diastolic (congestive) heart failure: Secondary | ICD-10-CM

## 2021-11-16 NOTE — Telephone Encounter (Signed)
Follow Up: ? ? ? ? ?Patient saw Sharyn Lull this morning. He was supposed to let her know if he was taking Valsartan and Furosemide. He says he does take both of those medicine.  ?

## 2021-11-16 NOTE — Patient Instructions (Signed)
Medication Instructions:  ?Your physician recommends that you continue on your current medications as directed. Please refer to the Current Medication list given to you today. ? ? ?*If you need a refill on your cardiac medications before your next appointment, please call your pharmacy* ? ? ?Lab Work: ?TODAY: CMET, CBC, TSH ? ?If you have labs (blood work) drawn today and your tests are completely normal, you will receive your results only by: ?MyChart Message (if you have MyChart) OR ?A paper copy in the mail ?If you have any lab test that is abnormal or we need to change your treatment, we will call you to review the results. ? ? ?Follow-Up: ?At [facility], you and your health needs are our priority.  As part of our continuing mission to provide you with exceptional heart care, we have created designated Provider Care Teams.  These Care Teams include your primary Cardiologist (physician) and Advanced Practice Providers (APPs -  Physician Assistants and Nurse Practitioners) who all work together to provide you with the care you need, when you need it. ? ?We recommend signing up for the patient portal called "MyChart".  Sign up information is provided on this After Visit Summary.  MyChart is used to connect with patients for Virtual Visits (Telemedicine).  Patients are able to view lab/test results, encounter notes, upcoming appointments, etc.  Non-urgent messages can be sent to your provider as well.   ?To learn more about what you can do with MyChart, go to [email or web address].   ? ?Your next appointment:   ?1 year(s) ? ?The format for your next appointment:   ?In Person ? ?Provider:   ?Mertie Moores, MD  ? ?

## 2021-11-17 LAB — COMPREHENSIVE METABOLIC PANEL
ALT: 12 IU/L (ref 0–44)
AST: 21 IU/L (ref 0–40)
Albumin/Globulin Ratio: 1.3 (ref 1.2–2.2)
Albumin: 4.2 g/dL (ref 3.7–4.7)
Alkaline Phosphatase: 127 IU/L — ABNORMAL HIGH (ref 44–121)
BUN/Creatinine Ratio: 13 (ref 10–24)
BUN: 22 mg/dL (ref 8–27)
Bilirubin Total: 3.4 mg/dL — ABNORMAL HIGH (ref 0.0–1.2)
CO2: 26 mmol/L (ref 20–29)
Calcium: 9.5 mg/dL (ref 8.6–10.2)
Chloride: 102 mmol/L (ref 96–106)
Creatinine, Ser: 1.75 mg/dL — ABNORMAL HIGH (ref 0.76–1.27)
Globulin, Total: 3.3 g/dL (ref 1.5–4.5)
Glucose: 101 mg/dL — ABNORMAL HIGH (ref 70–99)
Potassium: 4.2 mmol/L (ref 3.5–5.2)
Sodium: 144 mmol/L (ref 134–144)
Total Protein: 7.5 g/dL (ref 6.0–8.5)
eGFR: 40 mL/min/{1.73_m2} — ABNORMAL LOW (ref >59)

## 2021-11-17 LAB — CBC
Hematocrit: 36.8 % — ABNORMAL LOW (ref 37.5–51.0)
Hemoglobin: 12.9 g/dL — ABNORMAL LOW (ref 13.0–17.7)
MCH: 38.7 pg — ABNORMAL HIGH (ref 26.6–33.0)
MCHC: 35.1 g/dL (ref 31.5–35.7)
MCV: 111 fL — ABNORMAL HIGH (ref 79–97)
Platelets: 302 10*3/uL (ref 150–450)
RBC: 3.33 x10E6/uL — ABNORMAL LOW (ref 4.14–5.80)
RDW: 14.9 % (ref 11.6–15.4)
WBC: 7 10*3/uL (ref 3.4–10.8)

## 2021-11-17 LAB — TSH: TSH: 2.62 u[IU]/mL (ref 0.450–4.500)

## 2021-12-05 NOTE — Telephone Encounter (Signed)
Spoke with the patient's daughter who reports that the patient's furosemide was changed last fall to 40 mg daily. She reports that the patient was having a lot of swelling so she put him back on furosemide 40 mg twice daily. She states that the patient is also taking valsartan 160 mg daily. She also has questions about the patient's iron. The patient was having issues with constipation so the daughter stopped his multivitamin and iron supplement. Advised to follow up with his PCP in regards to iron. She is also wanting to find a supplement to give to the patient to help give him more energy. She states that she has seen things at [facility] and wanted to know if there was anything safe for him to take with his heart condition.  ?

## 2021-12-19 ENCOUNTER — Encounter: Payer: Self-pay | Admitting: Physician Assistant

## 2021-12-19 NOTE — Telephone Encounter (Signed)
He will need a virtual visit or an in office visit for evaluation. I need a diagnosis to order physical therapy. Thanks.

## 2021-12-20 ENCOUNTER — Encounter: Payer: Self-pay | Admitting: Physician Assistant

## 2021-12-20 ENCOUNTER — Telehealth (INDEPENDENT_AMBULATORY_CARE_PROVIDER_SITE_OTHER): Payer: Medicare HMO | Admitting: Physician Assistant

## 2021-12-20 VITALS — BP 152/77 | Wt 166.0 lb

## 2021-12-20 DIAGNOSIS — R609 Edema, unspecified: Secondary | ICD-10-CM | POA: Diagnosis not present

## 2021-12-20 DIAGNOSIS — I10 Essential (primary) hypertension: Secondary | ICD-10-CM | POA: Diagnosis not present

## 2021-12-20 NOTE — Progress Notes (Signed)
Start time: 2:22 pm ?End time: 2:40 pm ? ?Virtual Visit via Video Note ? ? Patient ID: James Bryant, male    DOB: May 26, 1946, 76 y.o.   MRN: [id number] ? ?I connected with above patient on 12/21/21 by a video enabled telemedicine application and verified that I am speaking with the correct person using two identifiers. ? ?Location: ?Patient: home with Wife and Daughter ?Provider: office ?  ?I discussed the limitations of evaluation and management by telemedicine and the availability of in person appointments. The patient expressed understanding and agreed to proceed. ? ?History of Present Illness: ? ?Chief Complaint  ?Patient presents with  ? Follow-up  ?  Wants referral for home health.  ? ?Patient, Wife, and Daughter are all on video and provide history; Wife requests that he have an aid come to help him walk better; she states that he hasn't been walking as much, has not had a fall or injury and doesn't use a cane or a walker to walk; patient doesn't drive and  has meals prepared for him; he is not able to shop for his own necessities. Patient states that his fluid pill is causing him to urinate a lot; Wife states that he had a recent visit with the Cardiologist and the Cardiologist encouraged him to walk more and has made any changes to medicines lately. ? ?  ?Observations/Objective: ? ?BP (!) 152/77   Wt 166 lb (75.3 kg)   BMI 26.79 kg/m?  ? ? ?Assessment: ?Encounter Diagnoses  ?Name Primary?  ? Edema, unspecified type Yes  ? Primary hypertension   ? ? ? ?Plan: ?Patient, Wife, and Daughter advised to call this office to schedule an in office visit for evaluation of what his needs are for a home health referral. All were advised that he needs to continue taking his medicine as prescribed and follow up with the Cardiologist as scheduled.  ? ? ? ?James Bryant was seen today for follow-up. ? ?Diagnoses and all orders for this visit: ? ?Edema, unspecified type ? ?Primary hypertension ? ? ? ?Follow up: call to schedule  an appointment ? ? ?I discussed the assessment and treatment plan with the patient. The patient was provided an opportunity to ask questions and all were answered. The patient agreed with the plan and demonstrated an understanding of the instructions. ?  ?The patient was advised to call back or seek an in-person evaluation if the symptoms worsen or if the condition fails to improve as anticipated. For emergencies go to Urgent Care or the Emergency Department for immediate evaluation.  ? ?I spent 15 minutes dedicated to the care of this patient, including pre-visit review of records, face to face time, post-visit ordering of testing and documentation. ? ? ? ?Irene Pap, PA-C ?

## 2021-12-21 ENCOUNTER — Encounter: Payer: Self-pay | Admitting: Physician Assistant

## 2021-12-23 ENCOUNTER — Ambulatory Visit: Payer: Medicare HMO | Admitting: Physician Assistant

## 2021-12-23 NOTE — Progress Notes (Deleted)
Established Patient Office Visit  Subjective:  Patient ID: James Bryant, male    DOB: 03-15-1946  Age: 76 y.o. MRN: [id number]  CC: No chief complaint on file.   HPI James Bryant presents for ***  Outpatient Medications Prior to Visit  Medication Sig Dispense Refill   aspirin EC 81 MG tablet Take 1 tablet (81 mg total) by mouth daily. 90 tablet 3   brimonidine (ALPHAGAN) 0.2 % ophthalmic solution Place 1 drop into both eyes daily.     carvedilol (COREG) 6.25 MG tablet Take 1 tablet (6.25 mg total) by mouth 2 (two) times daily. Please make yearly appt with Dr. Acie Fredrickson for July before anymore refills. 1st attempt 180 tablet 3   ferrous gluconate (FERGON) 324 MG tablet TAKE 1 TABLET (324 MG TOTAL) BY MOUTH DAILY WITH BREAKFAST. 90 tablet 0   fluticasone furoate-vilanterol (BREO ELLIPTA) 200-25 MCG/INH AEPB Inhale 1 puff into the lungs daily. 60 each 2   fluticasone-salmeterol (ADVAIR) 100-50 MCG/ACT AEPB INHALE 1 PUFF BY MOUTH INTO THE LUNGS TWICE DAILY 180 each 0   furosemide (LASIX) 40 MG tablet Take 40 mg by mouth in the morning and at bedtime.     hydroxyurea (HYDREA) 500 MG capsule Take 1 capsule (500 mg total) by mouth 2 (two) times daily. May take with food to minimize GI side effects. 180 capsule 3   latanoprost (XALATAN) 0.005 % ophthalmic solution Place 1 drop into both eyes 2 (two) times a day.     Multiple Vitamins-Minerals (MEGA MULTIVITAMIN FOR MEN PO) Take 1 tablet by mouth daily.     potassium chloride (KLOR-CON) 10 MEQ tablet Take 1 tablet (10 mEq total) by mouth daily. 90 tablet 3   valsartan (DIOVAN) 160 MG tablet Take 160 mg by mouth daily.     No facility-administered medications prior to visit.    No Known Allergies  Patient Care Team: Marcellina Millin as PCP - General (Physician Assistant) Nahser, Wonda Cheng, MD as PCP - Cardiology (Cardiology)  ROS Review of Systems    Objective:    Physical Exam  There were no vitals taken for this  visit.  Wt Readings from Last 3 Encounters:  12/20/21 166 lb (75.3 kg)  11/16/21 165 lb 12.8 oz (75.2 kg)  07/29/21 150 lb (68 kg)    Results for orders placed or performed in visit on 11/16/21  Comprehensive metabolic panel  Result Value Ref Range   Glucose 101 (H) 70 - 99 mg/dL   BUN 22 8 - 27 mg/dL   Creatinine, Ser 1.75 (H) 0.76 - 1.27 mg/dL   eGFR 40 (L) >59 mL/min/1.73   BUN/Creatinine Ratio 13 10 - 24   Sodium 144 134 - 144 mmol/L   Potassium 4.2 3.5 - 5.2 mmol/L   Chloride 102 96 - 106 mmol/L   CO2 26 20 - 29 mmol/L   Calcium 9.5 8.6 - 10.2 mg/dL   Total Protein 7.5 6.0 - 8.5 g/dL   Albumin 4.2 3.7 - 4.7 g/dL   Globulin, Total 3.3 1.5 - 4.5 g/dL   Albumin/Globulin Ratio 1.3 1.2 - 2.2   Bilirubin Total 3.4 (H) 0.0 - 1.2 mg/dL   Alkaline Phosphatase 127 (H) 44 - 121 IU/L   AST 21 0 - 40 IU/L   ALT 12 0 - 44 IU/L  CBC  Result Value Ref Range   WBC 7.0 3.4 - 10.8 x10E3/uL   RBC 3.33 (L) 4.14 - 5.80 x10E6/uL   Hemoglobin 12.9 (L)  13.0 - 17.7 g/dL   Hematocrit 36.8 (L) 37.5 - 51.0 %   MCV 111 (H) 79 - 97 fL   MCH 38.7 (H) 26.6 - 33.0 pg   MCHC 35.1 31.5 - 35.7 g/dL   RDW 14.9 11.6 - 15.4 %   Platelets 302 150 - 450 x10E3/uL  TSH  Result Value Ref Range   TSH 2.620 0.450 - 4.500 uIU/mL     {Labs (Optional):23779}  The ASCVD Risk score (Arnett DK, et al., 2019) failed to calculate for the following reasons:   The valid total cholesterol range is 130 to 320 mg/dL    Assessment & Plan:   Problem List Items Addressed This Visit   None   No orders of the defined types were placed in this encounter.   Follow-up: No follow-ups on file.    Irene Pap, PA-C

## 2021-12-26 ENCOUNTER — Encounter: Payer: Self-pay | Admitting: Physician Assistant

## 2021-12-26 ENCOUNTER — Ambulatory Visit (INDEPENDENT_AMBULATORY_CARE_PROVIDER_SITE_OTHER): Payer: Medicare HMO | Admitting: Physician Assistant

## 2021-12-26 VITALS — BP 120/80 | HR 71 | Ht 66.0 in | Wt 175.8 lb

## 2021-12-26 DIAGNOSIS — I509 Heart failure, unspecified: Secondary | ICD-10-CM

## 2021-12-26 DIAGNOSIS — Z7409 Other reduced mobility: Secondary | ICD-10-CM | POA: Diagnosis not present

## 2021-12-26 DIAGNOSIS — N1831 Chronic kidney disease, stage 3a: Secondary | ICD-10-CM

## 2021-12-26 DIAGNOSIS — Z9989 Dependence on other enabling machines and devices: Secondary | ICD-10-CM | POA: Insufficient documentation

## 2021-12-26 DIAGNOSIS — R609 Edema, unspecified: Secondary | ICD-10-CM

## 2021-12-26 DIAGNOSIS — R29898 Other symptoms and signs involving the musculoskeletal system: Secondary | ICD-10-CM

## 2021-12-26 NOTE — Assessment & Plan Note (Signed)
Stable, home health PT ordered ?

## 2021-12-26 NOTE — Assessment & Plan Note (Signed)
stable °

## 2021-12-26 NOTE — Assessment & Plan Note (Signed)
Stable, use cane as needed, home health PT ordered ?

## 2021-12-26 NOTE — Patient Instructions (Signed)
You will get a call to schedule an appointment with Home Health Physical Therapy  ? ?

## 2021-12-26 NOTE — Progress Notes (Signed)
? ?Established Patient Office Visit ? ?Subjective:  ?Patient ID: James Bryant, male    DOB: February 29, 1946  Age: 76 y.o. MRN: [id number] ? ?CC:  ?Chief Complaint  ?Patient presents with  ? Acute Visit  ?  Wants to discuss home health and his feet are swelling and he has SOB  ? ? ?HPI ?James Bryant presents accompanied by Wife in exam room and Daughter on speaker phone, for request for home health physical therapy to help with mobility at home and leg strengthening. Patient verbalizes that he knows why he takes his medicines, but sometimes doesn't take his fluid pills because he has to get to the bathroom quickly to urinate and sometimes doesn't make it; Wife states she has adult diapers and bed barriers to help, but sometimes he still doesn't take his medicine. Per chart review he has a history of non-compliance as well as a severe needle phobia. Daughter adds that he reports that he doesn't want to walk around a lot because he has pains in his legs; he has been told that he needs a knee replacement, but has chosen not to get it done; Wife states that sometimes he elevates his legs when they are swelling and sometimes he doesn't.  ? ?Outpatient Medications Prior to Visit  ?Medication Sig Dispense Refill  ? aspirin EC 81 MG tablet Take 1 tablet (81 mg total) by mouth daily. 90 tablet 3  ? brimonidine (ALPHAGAN) 0.2 % ophthalmic solution Place 1 drop into both eyes daily.    ? carvedilol (COREG) 6.25 MG tablet Take 1 tablet (6.25 mg total) by mouth 2 (two) times daily. Please make yearly appt with Dr. Acie Fredrickson for July before anymore refills. 1st attempt 180 tablet 3  ? ferrous gluconate (FERGON) 324 MG tablet TAKE 1 TABLET (324 MG TOTAL) BY MOUTH DAILY WITH BREAKFAST. 90 tablet 0  ? fluticasone furoate-vilanterol (BREO ELLIPTA) 200-25 MCG/INH AEPB Inhale 1 puff into the lungs daily. 60 each 2  ? fluticasone-salmeterol (ADVAIR) 100-50 MCG/ACT AEPB INHALE 1 PUFF BY MOUTH INTO THE LUNGS TWICE DAILY 180 each 0  ?  furosemide (LASIX) 40 MG tablet Take 40 mg by mouth in the morning and at bedtime.    ? hydroxyurea (HYDREA) 500 MG capsule Take 1 capsule (500 mg total) by mouth 2 (two) times daily. May take with food to minimize GI side effects. 180 capsule 3  ? latanoprost (XALATAN) 0.005 % ophthalmic solution Place 1 drop into both eyes 2 (two) times a day.    ? Multiple Vitamins-Minerals (MEGA MULTIVITAMIN FOR MEN PO) Take 1 tablet by mouth daily.    ? potassium chloride (KLOR-CON) 10 MEQ tablet Take 1 tablet (10 mEq total) by mouth daily. 90 tablet 3  ? valsartan (DIOVAN) 160 MG tablet Take 160 mg by mouth daily.    ? ?No facility-administered medications prior to visit.  ? ? ?No Known Allergies ? ?Patient Care Team: ?Marcellina Millin as PCP - General (Physician Assistant) ?Nahser, Wonda Cheng, MD as PCP - Cardiology (Cardiology) ?[place], Nira Conn, [place] (Optometry) ?Higgs, Mathis Dad, MD as Consulting Physician (Oncology) ? ?ROS ?Review of Systems  ?Constitutional:  Negative for activity change, chills, fatigue and fever.  ?HENT:  Negative for congestion, ear pain, hearing loss and voice change.   ?Eyes:  Negative for pain and redness.  ?Respiratory:  Negative for cough and shortness of breath.   ?Cardiovascular:  Positive for leg swelling.  ?Gastrointestinal:  Negative for constipation, diarrhea, nausea and vomiting.  ?Endocrine: Negative  for polyuria.  ?Genitourinary:  Negative for flank pain and frequency.  ?Musculoskeletal:  Positive for arthralgias. Negative for joint swelling and neck pain.  ?Skin:  Negative for rash.  ?Neurological:  Positive for weakness. Negative for dizziness.  ?Hematological:  Does not bruise/bleed easily.  ?Psychiatric/Behavioral:  Negative for agitation and behavioral problems.   ? ?  ?Objective:  ?  ?Physical Exam ?Vitals reviewed.  ?Constitutional:   ?   General: He is not in acute distress. ?   Appearance: Normal appearance.  ?HENT:  ?   Head: Normocephalic and atraumatic.  ?   Right Ear: External  ear normal.  ?   Left Ear: External ear normal.  ?   Nose: No congestion.  ?Eyes:  ?   Extraocular Movements: Extraocular movements intact.  ?   Conjunctiva/sclera: Conjunctivae normal.  ?   Pupils: Pupils are equal, round, and reactive to light.  ?Cardiovascular:  ?   Rate and Rhythm: Normal rate and regular rhythm.  ?   Pulses: Normal pulses.  ?   Heart sounds: Normal heart sounds.  ?Pulmonary:  ?   Effort: Pulmonary effort is normal.  ?   Breath sounds: Normal breath sounds. No wheezing.  ?Abdominal:  ?   General: Bowel sounds are normal.  ?   Palpations: Abdomen is soft.  ?Musculoskeletal:     ?   General: Normal range of motion.  ?   Cervical back: Normal range of motion.  ?   Right lower leg: Edema present.  ?   Left lower leg: Edema present.  ?Skin: ?   General: Skin is warm and dry.  ?Neurological:  ?   Mental Status: He is alert and oriented to person, place, and time.  ?Psychiatric:     ?   Mood and Affect: Mood normal.     ?   Behavior: Behavior normal.  ? ? ?BP 120/80   Pulse 71   Wt 175 lb 12.8 oz (79.7 kg)   SpO2 100%   BMI 28.37 kg/m?  ? ?Wt Readings from Last 3 Encounters:  ?12/26/21 175 lb 12.8 oz (79.7 kg)  ?12/20/21 166 lb (75.3 kg)  ?11/16/21 165 lb 12.8 oz (75.2 kg)  ? ? ?Results for orders placed or performed in visit on 11/16/21  ?Comprehensive metabolic panel  ?Result Value Ref Range  ? Glucose 101 (H) 70 - 99 mg/dL  ? BUN 22 8 - 27 mg/dL  ? Creatinine, Ser 1.75 (H) 0.76 - 1.27 mg/dL  ? eGFR 40 (L) >59 mL/min/1.73  ? BUN/Creatinine Ratio 13 10 - 24  ? Sodium 144 134 - 144 mmol/L  ? Potassium 4.2 3.5 - 5.2 mmol/L  ? Chloride 102 96 - 106 mmol/L  ? CO2 26 20 - 29 mmol/L  ? Calcium 9.5 8.6 - 10.2 mg/dL  ? Total Protein 7.5 6.0 - 8.5 g/dL  ? Albumin 4.2 3.7 - 4.7 g/dL  ? Globulin, Total 3.3 1.5 - 4.5 g/dL  ? Albumin/Globulin Ratio 1.3 1.2 - 2.2  ? Bilirubin Total 3.4 (H) 0.0 - 1.2 mg/dL  ? Alkaline Phosphatase 127 (H) 44 - 121 IU/L  ? AST 21 0 - 40 IU/L  ? ALT 12 0 - 44 IU/L  ?CBC  ?Result  Value Ref Range  ? WBC 7.0 3.4 - 10.8 x10E3/uL  ? RBC 3.33 (L) 4.14 - 5.80 x10E6/uL  ? Hemoglobin 12.9 (L) 13.0 - 17.7 g/dL  ? Hematocrit 36.8 (L) 37.5 - 51.0 %  ? MCV 111 (H)  79 - 97 fL  ? MCH 38.7 (H) 26.6 - 33.0 pg  ? MCHC 35.1 31.5 - 35.7 g/dL  ? RDW 14.9 11.6 - 15.4 %  ? Platelets 302 150 - 450 x10E3/uL  ?TSH  ?Result Value Ref Range  ? TSH 2.620 0.450 - 4.500 uIU/mL  ?  ? ?Last CBC ?Lab Results  ?Component Value Date  ? WBC 7.0 11/16/2021  ? HGB 12.9 (L) 11/16/2021  ? HCT 36.8 (L) 11/16/2021  ? MCV 111 (H) 11/16/2021  ? MCH 38.7 (H) 11/16/2021  ? RDW 14.9 11/16/2021  ? PLT 302 11/16/2021  ? ?Last metabolic panel ?Lab Results  ?Component Value Date  ? GLUCOSE 101 (H) 11/16/2021  ? NA 144 11/16/2021  ? K 4.2 11/16/2021  ? CL 102 11/16/2021  ? CO2 26 11/16/2021  ? BUN 22 11/16/2021  ? CREATININE 1.75 (H) 11/16/2021  ? EGFR 40 (L) 11/16/2021  ? CALCIUM 9.5 11/16/2021  ? PHOS 3.8 02/28/2021  ? PROT 7.5 11/16/2021  ? ALBUMIN 4.2 11/16/2021  ? LABGLOB 3.3 11/16/2021  ? AGRATIO 1.3 11/16/2021  ? BILITOT 3.4 (H) 11/16/2021  ? ALKPHOS 127 (H) 11/16/2021  ? AST 21 11/16/2021  ? ALT 12 11/16/2021  ? ANIONGAP 13 02/12/2019  ? ?Last lipids ?Lab Results  ?Component Value Date  ? CHOL 112 02/28/2021  ? HDL 38 (L) 02/28/2021  ? Vaughn 61 02/28/2021  ? TRIG 60 02/28/2021  ? CHOLHDL 2.9 02/28/2021  ? ?Last hemoglobin A1c ?No results found for: HGBA1C ?Last thyroid functions ?Lab Results  ?Component Value Date  ? TSH 2.620 11/16/2021  ? ?Last vitamin D ?Lab Results  ?Component Value Date  ? VD25OH 19.3 (L) 01/15/2019  ? ?Last vitamin B12 and Folate ?Lab Results  ?Component Value Date  ? BZJIRCVE93 1,279 (H) 01/15/2019  ? ?  ? ?The ASCVD Risk score (Arnett DK, et al., 2019) failed to calculate for the following reasons: ?  The valid total cholesterol range is 130 to 320 mg/dL ? ?  ?Assessment & Plan:  ? ?Problem List Items Addressed This Visit   ? ?  ? Cardiovascular and Mediastinum  ? Congestive heart failure, NYHA class 2 ([place])   ?  Stable, continue current management ? ?  ?  ? Relevant Orders  ? Ambulatory referral to Home Health  ?  ? Genitourinary  ? Stage 3a chronic kidney disease ([place])  ?  stable ? ?  ?  ?  ? Other  ? Edema  ?  Stabl

## 2021-12-26 NOTE — Assessment & Plan Note (Signed)
Stable, encouraged compliance with all medicines ?

## 2021-12-26 NOTE — Assessment & Plan Note (Signed)
Stable, continue current management 

## 2021-12-29 ENCOUNTER — Other Ambulatory Visit: Payer: Self-pay | Admitting: Medical

## 2021-12-30 ENCOUNTER — Telehealth: Payer: Self-pay

## 2021-12-30 NOTE — Telephone Encounter (Signed)
Selinda Eon from [facility] called stating she wanted to get verbal orders for PT once a week for 4 weeks then everyother week for 4 weeks. She can be reached at [phone]. If verbal order is given send back to me and I will call her and LM with verbal order. Thanks.  ?

## 2021-12-30 NOTE — Telephone Encounter (Signed)
That is fine. He can have PT once a week for 4 weeks then every other week for 4 weeks.  ? ?Thanks.

## 2022-01-09 ENCOUNTER — Other Ambulatory Visit: Payer: Self-pay | Admitting: Medical

## 2022-01-09 DIAGNOSIS — D75839 Thrombocytosis, unspecified: Secondary | ICD-10-CM

## 2022-01-09 NOTE — Telephone Encounter (Signed)
Is this ok to refill?  

## 2022-01-17 ENCOUNTER — Telehealth: Payer: Self-pay | Admitting: Physician Assistant

## 2022-01-17 ENCOUNTER — Other Ambulatory Visit: Payer: Self-pay | Admitting: Physician Assistant

## 2022-01-17 DIAGNOSIS — F40231 Fear of injections and transfusions: Secondary | ICD-10-CM

## 2022-01-17 DIAGNOSIS — I509 Heart failure, unspecified: Secondary | ICD-10-CM

## 2022-01-17 DIAGNOSIS — I10 Essential (primary) hypertension: Secondary | ICD-10-CM

## 2022-01-17 DIAGNOSIS — Z9989 Dependence on other enabling machines and devices: Secondary | ICD-10-CM

## 2022-01-17 DIAGNOSIS — Z91199 Patient's noncompliance with other medical treatment and regimen due to unspecified reason: Secondary | ICD-10-CM

## 2022-01-17 DIAGNOSIS — R29898 Other symptoms and signs involving the musculoskeletal system: Secondary | ICD-10-CM

## 2022-01-17 DIAGNOSIS — N1831 Chronic kidney disease, stage 3a: Secondary | ICD-10-CM

## 2022-01-17 DIAGNOSIS — Z7409 Other reduced mobility: Secondary | ICD-10-CM

## 2022-01-17 NOTE — Telephone Encounter (Signed)
Pt sister James Bryant called in for James Bryant, she is asking if you can help refer him to a nurse in home or even hospice care in home, she feels like he really needs it. She got some information from [facility] that she is interested in care for him and asked for me to give you the fax number for them, [phone]. She asked if you could contact her and if you cannot reach her, you can leave a detailed message on her phone but also if you cannot call, she says she can message with you on My Chart. Her number is [phone].

## 2022-01-17 NOTE — Telephone Encounter (Signed)
James Bryant, please call James Bryant's Sister James Bryant.  I'm not sure if we have consent to discuss his care with her.  I can order a Hospice evaluation, but James Bryant has to authorize it.

## 2022-01-17 NOTE — Telephone Encounter (Signed)
Hospice referral ordered. Thank you.

## 2022-02-06 ENCOUNTER — Other Ambulatory Visit: Payer: Self-pay | Admitting: Medical

## 2022-02-06 ENCOUNTER — Other Ambulatory Visit: Payer: Self-pay | Admitting: Cardiovascular Disease

## 2022-02-17 ENCOUNTER — Encounter: Payer: Self-pay | Admitting: Internal Medicine

## 2022-04-03 ENCOUNTER — Ambulatory Visit: Payer: Medicare HMO | Admitting: Physician Assistant

## 2022-04-06 ENCOUNTER — Encounter: Payer: Self-pay | Admitting: Medical

## 2022-04-06 ENCOUNTER — Ambulatory Visit (INDEPENDENT_AMBULATORY_CARE_PROVIDER_SITE_OTHER): Payer: Medicare HMO | Admitting: Medical

## 2022-04-06 VITALS — BP 110/50 | HR 64 | Wt 170.4 lb

## 2022-04-06 DIAGNOSIS — Z9989 Dependence on other enabling machines and devices: Secondary | ICD-10-CM

## 2022-04-06 DIAGNOSIS — F40231 Fear of injections and transfusions: Secondary | ICD-10-CM

## 2022-04-06 DIAGNOSIS — R4189 Other symptoms and signs involving cognitive functions and awareness: Secondary | ICD-10-CM | POA: Diagnosis not present

## 2022-04-06 DIAGNOSIS — I10 Essential (primary) hypertension: Secondary | ICD-10-CM | POA: Diagnosis not present

## 2022-04-06 DIAGNOSIS — Z789 Other specified health status: Secondary | ICD-10-CM

## 2022-04-06 DIAGNOSIS — N1831 Chronic kidney disease, stage 3a: Secondary | ICD-10-CM | POA: Diagnosis not present

## 2022-04-06 DIAGNOSIS — D75839 Thrombocytosis, unspecified: Secondary | ICD-10-CM | POA: Diagnosis not present

## 2022-04-06 DIAGNOSIS — D45 Polycythemia vera: Secondary | ICD-10-CM | POA: Diagnosis not present

## 2022-04-06 DIAGNOSIS — R32 Unspecified urinary incontinence: Secondary | ICD-10-CM

## 2022-04-06 DIAGNOSIS — I509 Heart failure, unspecified: Secondary | ICD-10-CM | POA: Diagnosis not present

## 2022-04-06 DIAGNOSIS — Z7409 Other reduced mobility: Secondary | ICD-10-CM

## 2022-04-06 DIAGNOSIS — R609 Edema, unspecified: Secondary | ICD-10-CM

## 2022-04-06 LAB — POCT URINALYSIS DIP (PROADVANTAGE DEVICE)
Blood, UA: NEGATIVE
Glucose, UA: NEGATIVE mg/dL
Ketones, POC UA: NEGATIVE mg/dL
Leukocytes, UA: NEGATIVE
Nitrite, UA: NEGATIVE
Specific Gravity, Urine: 1.015
Urobilinogen, Ur: 0.8
pH, UA: 6.5 (ref 5.0–8.0)

## 2022-04-06 NOTE — Progress Notes (Signed)
Subjective:    James Bryant is a 76 y.o. male who presents for Preventative Services visit and chronic medical problems/med check visit.    Chief Complaint  Patient presents with   med check    Med check, showing signs of dementia since May. Not his normal self- not moving a lot or talking like he would. Needs forms filled out for [facility]   Here today accompanied by wife [name], their neighbor and friend Arbie Cookey that lives across the street, and Mr. Cloninger daughter Altamese Dilling is on the phone on speaker phone.  I have seen him before for a few acute things that he normally seeing a different provider here that is no longer with our office.  Medical team: Here for primary care Ermalinda Barrios, PA and Dr. Mertie Moores with cardiology Dr. Zoila Shutter and Dr. Zola Button, oncology   Most of the history today is provided by wife and friend and daughter.  Patient contributed very little to the history.  Wife is concerned about his overall decline.  Prior to fall 2022 he was his normal self.  But in the last year or so he has stopped being as active.  He used to go outside and do things but does not do this anymore.  He does not get up much at all.  He used to be very active but now are concerned that he is just sitting in the chair most of the time.  He says things that makes him think he is having dementia issues for a while now.  He does not know the date or the month and gets confused about things.  He has urinary incontinence.  He does have stool continence.  He wears adult diaper for several months now.  Our office had referred for home health eval months ago with [facility].  At 1 point this was changed to palliative care referral in May 2023 with [facility] palliative care who apparently has not sent our office any records.  Currently there is a nurse coming out 2 days/week for about 30 minutes each visit.  They do not feel this is adding any benefit  currently.  The family is requesting home health physical therapy to get him up and moving around.  They also request a form be completed today to help try some adult daycare services  He has a history of kidney disease but is not seeing a kidney doctor.  He has had a severe phobia of needles in the past that he rarely allows blood draws.  Inhalers are listed on the chart record but he is not using an inhaler and has not had the need to use an inhaler in quite a while.  He is a non-smoker.  His wife brought up concerns about memory back in May 2023, but she felt like those concerns were deferred till now, not addressed at that time  His wife first noticed the problem last fall when some bills were not being paid on time that he would normally handle.  She is handling all the household finances at this point.  She is also handling his hygiene and care and feeding.  She notes that he typically sleeps a lot more than normal   Today on the MMSE of note he thought it was 61.  Triage information also reviewed today as follows: Primary Care Provider Tysinger, Camelia Eng, PA-C here for primary care  Current Health Care Team: Dentist, Dr. Elita Quick  doctor, Dr. none  Medical Services you may have received from other than [facility] providers in the past year (date may be approximate) [facility]-   Exercise Current exercise habits:  none    Nutrition/Diet Current diet: in general, a "healthy" diet    Depression Screen    04/06/2022   11:45 AM  Depression screen PHQ 2/9  Decreased Interest 0  Down, Depressed, Hopeless 0  PHQ - 2 Score 0    Activities of Daily Living Screen/Functional Status Survey Is the patient deaf or have difficulty hearing?: No Does the patient have difficulty seeing, even when wearing glasses/contacts?: No Does the patient have difficulty concentrating, remembering, or making decisions?: Yes Does the patient have difficulty  walking or climbing stairs?: Yes Does the patient have difficulty dressing or bathing?: Yes Does the patient have difficulty doing errands alone such as visiting a doctor's office or shopping?: No  Can patient draw a clock face showing 3:15 oclock, unable to do anything  [employer]    04/06/2022   11:45 AM 12/26/2021    3:46 PM 07/29/2021   11:24 AM 02/28/2021   11:14 AM 01/08/2020    1:28 PM  Fall Risk   Falls in the past year? 1 0 0 0 0  Number falls in past yr: 1 0  0 0  Injury with Fall? 1 0  0   Risk for fall due to : Impaired balance/gait;Impaired mobility Impaired balance/gait;Impaired mobility Medication side effect No Fall Risks   Follow up Falls evaluation completed Falls evaluation completed;Education provided Falls evaluation completed;Education provided;Falls prevention discussed Falls evaluation completed     Gait Assessment: Normal gait observed no  Advanced directives Does patient have a [place]? Yes Does patient have a Living Will? Yes  Past Medical History:  Diagnosis Date   Elevated liver function tests 01/17/2019   Elevated serum creatinine 01/17/2019   Fear of needles 01/17/2019   Former smoker, stopped smoking many years ago 01/17/2019   Hypertension    Polycythemia 01/17/2019   Thrombocytosis 01/17/2019    No past surgical history on file.  Social History   Socioeconomic History   Marital status: Married    Spouse name: Not on file   Number of children: Not on file   Years of education: Not on file   Highest education level: Not on file  Occupational History   Not on file  Tobacco Use   Smoking status: Former   Smokeless tobacco: Never  Vaping Use   Vaping Use: Never used  Substance and Sexual Activity   Alcohol use: No    Comment: stopped many years ago    Drug use: Never   Sexual activity: Not on file  Other Topics Concern   Not on file  Social History Narrative   Married. Retired Administrator. Stopped smoking  at age 82, smoked since age 87. Stopped alcohol use at age 73. Moved here from [place], [place] recently.    Extreme fear of needles.    Social Determinants of Health   Financial Resource Strain: Low Risk  (07/29/2021)   Overall Financial Resource Strain (CARDIA)    Difficulty of Paying Living Expenses: Not hard at all  Food Insecurity: No Food Insecurity (07/29/2021)   Hunger Vital Sign    Worried About Running Out of Food in the Last Year: Never true    Ran Out of Food in the Last Year: Never true  Transportation Needs: No  Transportation Needs (07/29/2021)   PRAPARE - Hydrologist (Medical): No    Lack of Transportation (Non-Medical): No  Physical Activity: Insufficiently Active (07/29/2021)   Exercise Vital Sign    Days of Exercise per Week: 7 days    Minutes of Exercise per Session: 10 min  Stress: No Stress Concern Present (07/29/2021)   [place]    Feeling of Stress : Not at all  Social Connections: Not on file  Intimate Partner Violence: Not on file    Family History  Problem Relation Age of Onset   Heart disease Mother    Hypertension Mother      Current Outpatient Medications:    ASPIRIN LOW DOSE 81 MG tablet, TAKE 1 TABLET (81 MG TOTAL) BY MOUTH DAILY., Disp: 90 tablet, Rfl: 1   brimonidine (ALPHAGAN) 0.2 % ophthalmic solution, Place 1 drop into both eyes daily., Disp: , Rfl:    carvedilol (COREG) 6.25 MG tablet, TAKE 1 TABLET TWICE DAILY (NEED MD APPOINTMENT), Disp: 180 tablet, Rfl: 3   ferrous gluconate (FERGON) 324 MG tablet, TAKE 1 TABLET EVERY DAY WITH BREAKFAST, Disp: 90 tablet, Rfl: 0   furosemide (LASIX) 40 MG tablet, Take 40 mg by mouth in the morning and at bedtime., Disp: , Rfl:    hydroxyurea (HYDREA) 500 MG capsule, TAKE 1 CAPSULE (500 MG TOTAL) BY MOUTH 2 (TWO) TIMES DAILY. MAY TAKE WITH FOOD TO MINIMIZE GI SIDE EFFECTS., Disp: 180 capsule, Rfl: 3   latanoprost  (XALATAN) 0.005 % ophthalmic solution, Place 1 drop into both eyes 2 (two) times a day., Disp: , Rfl:    Multiple Vitamins-Minerals (MEGA MULTIVITAMIN FOR MEN PO), Take 1 tablet by mouth daily., Disp: , Rfl:    potassium chloride (KLOR-CON) 10 MEQ tablet, Take 1 tablet (10 mEq total) by mouth daily., Disp: 90 tablet, Rfl: 3   valsartan (DIOVAN) 160 MG tablet, TAKE 1 TABLET EVERY DAY, Disp: 90 tablet, Rfl: 0  No Known Allergies  History reviewed: allergies, current medications, past family history, past medical history, past social history, past surgical history and problem list    Objective:      Biometrics BP (!) 110/50   Pulse 64   Wt 170 lb 6.4 oz (77.3 kg)   BMI 27.50 kg/m   Wt Readings from Last 3 Encounters:  04/06/22 170 lb 6.4 oz (77.3 kg)  12/26/21 175 lb 12.8 oz (79.7 kg)  12/20/21 166 lb (75.3 kg)   General: Well-developed, well-nourished no acute distress Seated in wheelchair, answers a few questions but for the most part seem to doze off during today's visit Bilateral lower extremity 1+ pitting edema Significantly abnormal MMSE today    Assessment:   Encounter Diagnoses  Name Primary?   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  Currently his wife seems to have  a good handle on taking care of his ADLs such as bathing and feeding but she wants to get some help getting him up and moving around.  We will place a referral for home health  physical therapy.  I will go ahead and refer to neurology for formal evaluation for likely diagnosis of dementia.  He has not had any type of head scans.  [facility].  We will give this a try and see if he is able to tolerate this or participate  I reviewed his cardiology notes from March 2023.  He has nonischemic cardiomyopathy, chronic heart failure, hypertension, and things are thought to be stable at the time although he does have an ejection fraction of about 25 to 30%.  He was continued on same medicine at that time  He has chronic kidney disease but no prior nephrology consult.  I had recommended this a few months ago but they had other things going on were not able to pursue consult at that time  Polycythemia vera, thrombocytosis-compliant with hydroxyurea  Hypertension-stable, continue current medication  Edema - continue Lasix and encourage some ambulation.  Referral for home health physical therapy  Urinary incontinence-currently on adult diapers  Referrals today for home health physical therapy, referral to neurology  We will request records from palliative care/[facility]  I reviewed labs that I ordered back in March 2023.  Things were relatively stable at that time   Drayden was seen today for med check.  Diagnoses and all orders for this visit:  Cognitive decline -     Ambulatory referral to Neurology -     Ambulatory referral to [facility] -     POCT Urinalysis DIP (Proadvantage Device)  Thrombocytosis  Stage 3a chronic kidney disease (HCC)  Severe needle phobia  Primary hypertension  Polycythemia vera (HCC)  Dependence on cane  Decreased activities of daily living (ADL)  Congestive heart failure, NYHA class 2, unspecified congestive heart failure type ([place])  Edema, unspecified type  Mobility impaired  Urinary incontinence, unspecified type    F/u pending referrals

## 2022-04-10 ENCOUNTER — Other Ambulatory Visit: Payer: Self-pay | Admitting: Medical

## 2022-04-10 ENCOUNTER — Telehealth: Payer: Self-pay | Admitting: Medical

## 2022-04-10 MED ORDER — LORAZEPAM 0.5 MG PO TABS: 0.5000 mg | ORAL_TABLET | Freq: Two times a day (BID) | ORAL | 0 refills | Status: AC

## 2022-04-10 NOTE — Telephone Encounter (Signed)
Pt's wife advised.

## 2022-04-10 NOTE — Telephone Encounter (Signed)
Pt's wife came in and states that pt needs COVID vaccines (2) in order to go to adult daycare. Pt is to receive vaccines at Pharmacy. Pt is very anxious about getting a shot. He is fearful of injections. Wife is requesting something she can give him before injections. Please advise wife at [phone]. Pt uses [facility].

## 2022-04-17 DIAGNOSIS — R4182 Altered mental status, unspecified: Secondary | ICD-10-CM | POA: Diagnosis not present

## 2022-04-17 DIAGNOSIS — Z7401 Bed confinement status: Secondary | ICD-10-CM | POA: Diagnosis not present

## 2022-04-21 ENCOUNTER — Telehealth: Payer: Self-pay | Admitting: Medical

## 2022-04-21 DIAGNOSIS — Z7401 Bed confinement status: Secondary | ICD-10-CM | POA: Diagnosis not present

## 2022-04-21 DIAGNOSIS — M2550 Pain in unspecified joint: Secondary | ICD-10-CM | POA: Diagnosis not present

## 2022-04-21 NOTE — Telephone Encounter (Signed)
Physical therapist assistant Clare Gandy called in and stated Laken just gout of a facility and is coming home today and he was also supposed to have a physical therapy appointment but his caregiver told him that he wont be able to make the appointment because she feels like that is putting too much on him at this time. Tom wanted you to know he will be putting today as a missed appointment and if you need to call him, you can reach him at [phone].

## 2022-04-26 ENCOUNTER — Other Ambulatory Visit: Payer: Self-pay | Admitting: Cardiovascular Disease

## 2022-04-26 ENCOUNTER — Encounter: Payer: Self-pay | Admitting: Internal Medicine

## 2022-04-28 ENCOUNTER — Other Ambulatory Visit: Payer: Self-pay | Admitting: Medical

## 2022-04-28 NOTE — Telephone Encounter (Signed)
Dose was decreased

## 2022-05-10 NOTE — Progress Notes (Unsigned)
[facility]  PATIENT: James Bryant DOB: 1945/10/29  REFERRING CLINICIAN: Tysinger, Camelia Eng, PA-C HISTORY FROM: self, wife REASON FOR VISIT: memory loss   HISTORICAL  CHIEF COMPLAINT:  Chief Complaint  Patient presents with   New Patient (Initial Visit)    Pt is well. Family states they started realizing he was having cognitive issues in April 2023. Room 8 with wife and family.    HISTORY OF PRESENT ILLNESS:  The patient presents for evaluation of memory loss which he began to notice in April 2023, though per family he has become less active in the past year. Prior to that he would go outside and visit his family frequently. She noticed bills were not being paid on time as far back as last fall. He will get disoriented about the date or location. Will not remember familiar places he has been to before. Will think cars he used to have are outside even though he has not owned them for several years. Has said nonsensical things, for example stated he owned an inflatable car.  TBI:  No past history of TBI Stroke:  no past history of stroke Seizures:  no past history of seizures Sleep: He has been sleeping more than normal. He snores at night Mood: wife notices he has been more withdrawn. Will be more anxious when wife is gone. He is more irritable lately. More disinhibition, will remove clothes when other people are in the room  Functional status:  Patient lives with wife who is his caregiver. Family is looking into memory care Cooking: has meals prepared for him Shopping: unable to shop on his own Bathing: Wife helps with hygiene  Toileting: urinary incontinence, wears adult diapers Driving: patient does not drive Bills: wife handles finances now after noticing bills were not being paid on time Medications: wife manages medications Forgetting loved ones names?: yes Word finding difficulty? yes  OTHER MEDICAL CONDITIONS: nonischemic cardiomyopathy, HTN,  CKD, polycythemia vera, former smoker   REVIEW OF SYSTEMS: Full 14 system review of systems performed and negative with exception of: memory loss  ALLERGIES: No Known Allergies  HOME MEDICATIONS: Outpatient Medications Prior to Visit  Medication Sig Dispense Refill   ASPIRIN LOW DOSE 81 MG tablet TAKE 1 TABLET (81 MG TOTAL) BY MOUTH DAILY. 90 tablet 1   brimonidine (ALPHAGAN) 0.2 % ophthalmic solution Place 1 drop into both eyes daily.     carvedilol (COREG) 6.25 MG tablet TAKE 1 TABLET TWICE DAILY (NEED MD APPOINTMENT) 180 tablet 3   ferrous gluconate (FERGON) 324 MG tablet TAKE 1 TABLET EVERY DAY WITH BREAKFAST 90 tablet 0   furosemide (LASIX) 40 MG tablet TAKE 1 TABLET EVERY DAY 90 tablet 2   hydroxyurea (HYDREA) 500 MG capsule TAKE 1 CAPSULE (500 MG TOTAL) BY MOUTH 2 (TWO) TIMES DAILY. MAY TAKE WITH FOOD TO MINIMIZE GI SIDE EFFECTS. 180 capsule 3   latanoprost (XALATAN) 0.005 % ophthalmic solution Place 1 drop into both eyes 2 (two) times a day.     LORazepam (ATIVAN) 0.5 MG tablet Take 1 tablet (0.5 mg total) by mouth 2 (two) times daily. 5 tablet 0   Multiple Vitamins-Minerals (MEGA MULTIVITAMIN FOR MEN PO) Take 1 tablet by mouth daily.     potassium chloride (KLOR-CON) 10 MEQ tablet TAKE 1 TABLET EVERY DAY 90 tablet 2   valsartan (DIOVAN) 160 MG tablet TAKE 1 TABLET EVERY DAY 90 tablet 0   No facility-administered medications prior to visit.    PAST MEDICAL HISTORY:  Past Medical History:  Diagnosis Date   Elevated liver function tests 01/17/2019   Elevated serum creatinine 01/17/2019   Fear of needles 01/17/2019   Former smoker, stopped smoking many years ago 01/17/2019   Hypertension    Polycythemia 01/17/2019   Thrombocytosis 01/17/2019    PAST SURGICAL HISTORY: No past surgical history on file.  FAMILY HISTORY: Family History  Problem Relation Age of Onset   Heart disease Mother    Hypertension Mother     SOCIAL HISTORY: Social History   Socioeconomic History    Marital status: Married    Spouse name: Not on file   Number of children: Not on file   Years of education: Not on file   Highest education level: Not on file  Occupational History   Not on file  Tobacco Use   Smoking status: Former   Smokeless tobacco: Never  Vaping Use   Vaping Use: Never used  Substance and Sexual Activity   Alcohol use: No    Comment: stopped many years ago    Drug use: Never   Sexual activity: Not on file  Other Topics Concern   Not on file  Social History Narrative   Married. Retired Administrator. Stopped smoking at age 47, smoked since age 61. Stopped alcohol use at age 45. Moved here from [place], [place] recently.    Extreme fear of needles.    Social Determinants of Health   Financial Resource Strain: Low Risk  (07/29/2021)   Overall Financial Resource Strain (CARDIA)    Difficulty of Paying Living Expenses: Not hard at all  Food Insecurity: No Food Insecurity (07/29/2021)   Hunger Vital Sign    Worried About Running Out of Food in the Last Year: Never true    Ran Out of Food in the Last Year: Never true  Transportation Needs: No Transportation Needs (07/29/2021)   PRAPARE - Hydrologist (Medical): No    Lack of Transportation (Non-Medical): No  Physical Activity: Insufficiently Active (07/29/2021)   Exercise Vital Sign    Days of Exercise per Week: 7 days    Minutes of Exercise per Session: 10 min  Stress: No Stress Concern Present (07/29/2021)   [place]    Feeling of Stress : Not at all  Social Connections: Not on file  Intimate Partner Violence: Not on file     PHYSICAL EXAM  GENERAL EXAM/CONSTITUTIONAL: Vitals:  Vitals:   05/11/22 1126  BP: 133/69  Pulse: 71  Weight: 161 lb (73 kg)  Height: '5\' 7"'$  (1.702 m)   Body mass index is 25.22 kg/m. Wt Readings from Last 3 Encounters:  05/11/22 161 lb (73 kg)  04/06/22 170 lb 6.4 oz (77.3 kg)   12/26/21 175 lb 12.8 oz (79.7 kg)   NEUROLOGIC: MENTAL STATUS:     05/11/2022   11:30 AM  Montreal Cognitive Assessment   Visuospatial/ Executive (0/5) 0  Naming (0/3) 2  Attention: Read list of digits (0/2) 0  Attention: Read list of letters (0/1) 0  Attention: Serial 7 subtraction starting at 100 (0/3) 0  Language: Repeat phrase (0/2) 0  Language : Fluency (0/1) 0  Abstraction (0/2) 0  Delayed Recall (0/5) 0  Orientation (0/6) 2  Total 4     CRANIAL NERVE:  2nd - no papilledema or hemorrhages on fundoscopic exam 2nd, 3rd, 4th, 6th - pupils equal and reactive to light, visual fields full to confrontation, extraocular  muscles intact, no nystagmus 5th - facial sensation symmetric 7th - facial strength symmetric 8th - hearing intact 9th - palate elevates symmetrically, uvula midline 11th - shoulder shrug symmetric 12th - tongue protrusion midline  MOTOR:  normal bulk and tone, no cogwheeling, full strength in the BUE, BLE, paratonia  SENSORY:  normal and symmetric to light touch, pinprick, temperature, vibration  COORDINATION:  finger-nose-finger, fine finger movements normal, no tremor  REFLEXES:  deep tendon reflexes present and symmetric  GAIT/STATION:  normal     DIAGNOSTIC DATA (LABS, IMAGING, TESTING) - I reviewed patient records, labs, notes, testing and imaging myself where available.  Lab Results  Component Value Date   WBC 7.0 11/16/2021   HGB 12.9 (L) 11/16/2021   HCT 36.8 (L) 11/16/2021   MCV 111 (H) 11/16/2021   PLT 302 11/16/2021      Component Value Date/Time   NA 144 11/16/2021 0858   K 4.2 11/16/2021 0858   CL 102 11/16/2021 0858   CO2 26 11/16/2021 0858   GLUCOSE 101 (H) 11/16/2021 0858   GLUCOSE 95 02/12/2019 1202   BUN 22 11/16/2021 0858   CREATININE 1.75 (H) 11/16/2021 0858   CREATININE 1.46 (H) 02/12/2019 1202   CALCIUM 9.5 11/16/2021 0858   PROT 7.5 11/16/2021 0858   ALBUMIN 4.2 11/16/2021 0858   AST 21 11/16/2021 0858    AST 29 02/12/2019 1202   ALT 12 11/16/2021 0858   ALT 33 02/12/2019 1202   ALKPHOS 127 (H) 11/16/2021 0858   BILITOT 3.4 (H) 11/16/2021 0858   BILITOT 1.9 (H) 02/12/2019 1202   GFRNONAA 47 (L) 02/12/2019 1202   GFRAA 54 (L) 02/12/2019 1202   Lab Results  Component Value Date   CHOL 112 02/28/2021   HDL 38 (L) 02/28/2021   LDLCALC 61 02/28/2021   TRIG 60 02/28/2021   CHOLHDL 2.9 02/28/2021   No results found for: "HGBA1C" Lab Results  Component Value Date   VITAMINB12 1,279 (H) 01/15/2019   Lab Results  Component Value Date   TSH 2.620 11/16/2021      ASSESSMENT AND PLAN  76 y.o. year old male with a history of nonischemic cardiomyopathy, HTN, CKD, polycythemia vera, former smoker who presents for evaluation of memory loss. MOCA score today is 4/10. With his clinical presentation, this is concerning for severe dementia. Will order brain MRI as he reportedly had a significant decline over the past 5 months. Will start donepezil for his memory. Dementia resource packet provided.   1. Memory loss       PLAN: - Labs: B12  - MRI brain  - Start donepezil 5 mg daily, if tolerating medication after 4 weeks can increase up to 10 mg daily  - Dementia resource packet provided   Orders Placed This Encounter  Procedures   MR BRAIN WO CONTRAST   Vitamin B12    Meds ordered this encounter  Medications   donepezil (ARICEPT) 5 MG tablet    Sig: Take 1 tablet (5 mg total) by mouth at bedtime.    Dispense:  30 tablet    Refill:  6    Return in about 7 months (around 12/10/2022).  I spent an average of 60 minutes chart reviewing and counseling the patient, with at least 50% of the time face to face with the patient.   Genia Harold, MD  [facility] [address], [place], [place] [address] [phone]

## 2022-05-11 ENCOUNTER — Ambulatory Visit: Admitting: Psychiatry

## 2022-05-11 VITALS — BP 133/69 | HR 71 | Ht 67.0 in | Wt 161.0 lb

## 2022-05-11 DIAGNOSIS — R413 Other amnesia: Secondary | ICD-10-CM | POA: Diagnosis not present

## 2022-05-11 DIAGNOSIS — F03C0 Unspecified dementia, severe, without behavioral disturbance, psychotic disturbance, mood disturbance, and anxiety: Secondary | ICD-10-CM | POA: Diagnosis not present

## 2022-05-11 MED ORDER — DONEPEZIL HCL 5 MG PO TABS: 5.0000 mg | ORAL_TABLET | Freq: Every day | ORAL | 6 refills | Status: DC

## 2022-05-11 NOTE — Patient Instructions (Addendum)
[facility] family support program: [email or web address]  Start donepezil 5 mg daily for memory. If he tolerates this well after 4 weeks, let me know and we can increase dose to 10 mg daily. Take with meals to avoid upset stomach  Return to clinic during office hours for blood work (B12 levels) when able   [place] "Dementia" is a general term for when a person has developed difficulties with reasoning, judgment, and memory. People who have dementia usually have some memory loss as well as difficulty in at least one other area, such as: ?Speaking or writing coherently (or understanding what is said or written) ?Recognizing familiar surroundings ?Planning and carrying out complex or multi-step tasks In order to be considered dementia these issues must be severe enough to interfere with a person's independence and daily activities. Dementia can be caused by several diseases that affect the brain. The most common cause is Alzheimer disease. Alzheimer disease is present in approximately 66 to 21 percent of all cases of dementia; other degenerative and/or vascular diseases may be present as well, particularly as a person gets older.   DEMENTIA RISK FACTORS There is no way to predict with certainty who will develop dementia. Each form of dementia has its own risk factors, but most forms have several risk factors in common. Age -- The biggest risk factor for dementia is age: dementia is rare in people younger than 28 years and becomes very common in people older than 63. For example, dementia affects approximately one in six people between 87 and 46 years old, one in three above 15 years, and almost half of people over age 51.  Family history -- Some forms of dementia have a genetic component, meaning that they tend to run in families. Having a close family member with Alzheimer disease increases your chances of developing it. People with a first-degree relative (parent or sibling) with  Alzheimer disease have a greater chance of developing the disorder. The risk is probably highest if the family member developed Alzheimer disease at a younger age (less than 44 years old) and is lower if the family member did not get Alzheimer disease until late in life. However, families that have a very strong genetic tendency toward Alzheimer disease are uncommon.  Other factors -- Studies indicate that high blood pressure, smoking, and diabetes may be risk factors for dementia. Experts are still not sure how treatment for these problems might influence your risk of developing dementia beyond their benefit of reducing stroke risk. Lifestyle factors have also been implicated in dementia. For instance, people who remain physically active, socially connected, and mentally engaged seem less likely to develop dementia than people who do not. These activities may produce more cognitive (mental) reserve or resilience, delaying the emergence of symptoms until an older age.  DEMENTIA SYMPTOMS Each form of dementia can cause difficulty with memory, language, reasoning, and judgment, but the symptoms are often very different from person to person. Symptoms also change over time. The differences between one form of dementia and another may only be recognizable to skilled health care providers who have experience working with people with dementia. Sometimes family members notice changes but mistakenly attribute them to aging.  Is memory loss normal? -- Many people worry that memory problems are related to early Alzheimer disease. However, some problems are normal and just related to aging, and do not signify a progressive dementia. Normal age-related changes often cause minor difficulties with immediate memory, for example, remembering a phone number or a  set of directions for a short time. Temporary difficulty recalling proper names, even very familiar ones, is also common with aging. As people age normally, it is  common to complain of less efficient and slower processing and learning of new information. Memory changes due to normal aging are usually mild and do not worsen greatly over time, nor should they interfere with a person's day-to-day functioning.  Early changes -- The earliest symptoms of Alzheimer disease are gradual and often subtle. Many people and their families first notice difficulty recalling recent events or information. This often emerges as a tendency to repeat stories or questions or to request or require repetition of material to be able to remember. If you find yourself telling an older family member or friend "I told you that earlier" or "You have told me that more than once," you might begin to suspect Alzheimer disease. Other changes can include one or more of the following: ?Difficulties with language (eg, not being able to find the right words for things) ?Difficulty with concentration and reasoning ?Problems with complex tasks like paying bills, cooking, or balancing a checkbook ?Getting lost in a familiar place  Late changes -- As Alzheimer disease progresses, a person's ability to think clearly continues to decline, and any or all of the changes listed above may be more disruptive. In addition, personality and behavioral symptoms can become quite troublesome. These can include: ?Increased anger or hostility, sometimes aggressive behavior; alternatively, some people become depressed or exhibit little interest in their surroundings (called "apathy") ?Sleep problems ?Hallucinations and/or delusions ?Disorientation ?Needing help with basic tasks (such as eating, bathing, and dressing) ?Incontinence (difficulty controlling the bladder and/or bowels)  The number of symptoms, the functions that are impaired, and the speed with which symptoms progress can vary widely from one person to the next. In some people, severe dementia occurs within five years of the diagnosis; for others, the  progression can take more than 10 years. Most people with Alzheimer disease do not die from the disease itself, but rather from a secondary illness such as pneumonia, bladder infection, or complications of a fall.  DEMENTIA DIAGNOSIS To diagnose dementia and identify the type of dementia, health care providers typically rely on the information they can gather by interacting with the person and speaking with their family members. The provider will typically perform memory and other cognitive (thinking) tests to assess the person's degree of difficulty with different types of problems. The results of these tests can then be monitored over time to observe whether functioning stays the same or declines.  Blood tests are usually done to find out if a chemical or hormonal imbalance or vitamin deficiency is contributing to the person's difficulties. Brain scans (usually MRI) are often performed in people with dementia to look for other problems. Sometimes the MRI can also help health care providers identify the type of dementia, since different types can have characteristic brain changes.  SAFETY AND LIFESTYLE ISSUES FOR PEOPLE WITH DEMENTIA A major issue for caregivers is making sure the person with dementia stays safe. Because many people with dementia do not realize that their mental functioning is impaired, they try to continue their day-to-day activities as usual. This can lead to physical danger, and caregivers must help to avoid situations that can threaten the safety of the person or others. The following information applies specifically to people with Alzheimer disease, but much of it is also relevant to people with other forms of dementia.  Medications -- People with  Alzheimer disease often have trouble remembering to take medications they are prescribed for other conditions, or they become confused about which medications to take. They are also at increased risk for potentially dangerous side effects  from certain medications. Sedatives and certain other drugs (such as some antihistamines and antidepressants) may carry risk of increasing cognitive impairment. It is important to develop a plan for medication monitoring and safety. People with dementia often need help taking their medications. It's a good idea to throw away old pill bottles and other medications that are no longer needed.  Driving -- Driving is often one of the first safety issues that arises in people with Alzheimer disease. In people with Alzheimer disease, the risk of having a car accident is significantly increased, especially as the disease progresses. It is best to discuss the issue of driving early, before the symptoms become advanced. Over time, everyone living long enough with dementia will reach a point where driving is too dangerous. Losing the ability to drive can be hard to accept because it represents independence for many people. It can also be challenging if the person does not completely appreciate their impairments in mental functioning or reaction time. In particular, driving at night may carry extra risk. Many people with mild but worrisome impairments will insist that they can safely drive locally or in the daytime. That may be true for the present time; however, people may forget that they have agreed to limitations. In addition, their ability to drive safely, even with restrictions, will deteriorate over time. A roadside driving test is often recommended if there is disagreement or uncertainty about a person's ability to drive. However, if a person with newly diagnosed, mild Alzheimer disease is deemed still able to drive, they will need to be reassessed every six months, with the understanding that driving will eventually no longer be possible. There may be important insurance implications of continued driving when medical records document advice to stop driving.  Cooking -- Cooking is another area that can lead to  serious safety concerns and may require help or supervision. Symptoms such as distractibility, forgetfulness, and difficulty following directions can lead to burns, fires, or other injuries. The use of gas cooking appliance raises a particular concern. A family member may have to ask the utility company to disconnect gas stoves if there is potential for accident or injury. Newer induction electric stoves do not change color when on, and may carry an inadvertent burn risk if a person forgets what they are doing.  Wandering -- As dementia progresses, some people with Alzheimer disease begin to wander. Because restlessness, distractibility, and memory problems are common, a person who wanders may easily become lost. Identification bracelets can help ensure that a lost wanderer gets home. The [employer] provides a "Wandering Support" program that provides ID tags and 24-hour assistance to patients who are preregistered for this program. There are many "locator" applications that allow the person with dementia to wear or carry a GPS device that a family member can track with their cell phone. Regular exercise may decrease the restlessness that can lead to wandering. Exercise is also just good practice to maintain strength, good sleep, and overall health. If wandering continues, wearable alarm systems are available that alert caretakers when the person leaves the home.  Falls -- Falls with secondary injuries are one of the most important causes of additional disability in people with all types of dementia, including Alzheimer disease. Commonly used medications can increase risk of falls  and injuries. Hip fracture is a particular concern in older people, as it can lead to serious complications and sometimes even death. To reduce the risk of falls, potential tripping hazards such as loose electrical cords, slippery rugs, and clutter should be removed. Inadvertent hoarding may develop and pose a safety  risk. If the person lives alone, family members or elder services should perform safety inspections of the living space periodically. Medication lists should also be reviewed with your doctor to identify those that might increase the risk of falling. Regular exercise, especially early in the course of dementia, and use of assistive devices like canes can also help with balance.  DEMENTIA TREATMENT Medications for Alzheimer disease -- Many people with Alzheimer disease will have the option of trying a medication. A trial of medication is usually begun for a period of a few to several weeks while the person is monitored for side effects and response. A health care provider should periodically review all medications to see if they are providing any benefit. It is important to have realistic expectations about the potential benefits of medication therapy in Alzheimer disease. None of these medications cure the disease, and the reality is that over time the person will continue to worsen. When medication does have an effect, the goal is not to stop progression of the disease, but to improve quality of life for the person and their family to the extent possible.  Treatment to slow or delay progression -- There is currently no cure for Alzheimer disease. However, experts are studying treatments in the hope of finding a way to slow the progression of the mental and functional decline, along with scientific efforts to prevent or delay onset.  Treatment of memory problems -- There are several medicines currently available for treating the memory problems associated with Alzheimer disease; they are also used in people with other forms of dementia.  ?Donepezil (brand name: Aricept) This medications allow more of a chemical called acetylcholine to be active in the brain, making up for drops in acetylcholine levels that happen in Alzheimer disease. It can cause side effects such as nausea, vomiting, and diarrhea in some  people. It also may cause weight loss in many people. When taken at bedtime, cholinesterase inhibitors can cause very vivid dreams. If there is no improvement in symptoms or side effects are bothersome, the medication should be stopped. Sometimes the person's symptoms will worsen after treatment is stopped; if this happens, the medication may be started again.  Treatment of behavioral symptoms -- The behavioral symptoms of Alzheimer disease are often more troubling than the cognitive (mental) symptoms. Even in mild cases, agitation, anxiety, and irritability can occur, and generally worsen as Alzheimer disease advances. This can be stressful for the person as well as for their family and caregivers. A combination of medications and behavioral therapy may be helpful. Non-medication therapies are preferred, as virtually all medications used for behavioral symptoms can increase confusion and many are associated with serious side effects and even an increased risk of death.  Depression -- Depression is common, especially in the early phases of dementia. It may be treated with behavioral therapy and/or with medications. The key is to recognize that depression may be playing a role in the person's symptoms. If depression is causing distress, it is worth treating. Potentially helpful medicines include a group of medicines known as selective serotonin reuptake inhibitors, or SSRIs, which are usually preferred over other choices in patients with dementia. Widely used SSRIs include fluoxetine (  brand name: Prozac), sertraline (brand name: Zoloft), paroxetine (sample brand names: Brisdelle, Paxil), citalopram (brand name: Celexa), and escitalopram (brand names: Lexapro, Cipralex).   A variety of behavioral therapies are often helpful, do not have the side effects often seen with medications, and may be recommended for depression. Behavioral therapy involves changing the person's environment (eg, regular exercise,  avoiding triggers that cause sadness, socializing with others, engaging in pleasant activities that a person enjoys).  Agitation and aggression -- One of the most difficult issues for caregivers and people with Alzheimer disease is aggressive behavior. Fortunately, this behavior is not common. However, many family members are reluctant to report aggressive behavior. In some cases, the behavior becomes physically abusive as dementia progresses. Agitation and aggression can be caused by a number of factors, including: ?Confusion, misunderstanding, or disorientation (doctors use the term "delirium" as a general term to describe a state of confusion in which a person does not think or behave normally) ?Frightening or paranoid delusions or hallucinations ?Depression or anxiety ?Sleep disorders, such as reduced sleep or altered sleep/wake cycles ?Certain medical conditions that can cause delirium, such as urinary tract infection or pneumonia ?Being in physical pain or discomfort ?Side effects of certain medications Delusions (ie, believing something that is not real or true) are common in patients with dementia, occurring in up to 30 percent of those with advanced disease. Paranoid delusions are particularly distressing to both the patients and the caregivers: these often include beliefs that someone has invaded the house, that family members have been replaced by impostors, that spouses have been unfaithful, or that personal possessions have been stolen.   Family members should discuss any concerns about aggressive behavior with a health care provider and arrange for help if necessary. The best treatment for these symptoms depends upon what triggers them. As an example, a person who becomes aggressive during periods of confusion might best be treated by talking through the problem, while someone who becomes aggressive during delusions might require medication. Often, behavior improves once an underlying  medical condition is treated. Caregivers can learn strategies to help lessen the number of triggers and confrontations.   Sleep problems -- Sleep disorders can be treated with either medicine or behavior changes or both: for example, limiting daytime naps, increasing physical activity, avoiding caffeine and alcohol in the evening. In some people, medication to help with sleep may be recommended, although these medications almost always have side effects (eg, worsened confusion and increased risk of falls). Maintaining daily rhythms, using artificial lighting when needed during the day, and avoiding bright light exposures during the night may help maintain normal wake-sleep cycles.   COPING WITH DEMENTIA Being diagnosed with any form of dementia can be distressing and overwhelming for the person affected as well as their loved ones. For people with dementia -- It is important for people with early dementia to care for their physical and mental health. This means getting regular checkups, taking medicines if needed, eating a healthy diet, exercising regularly, getting enough sleep, and avoiding activities that may be risky. It is often helpful to talk to others through support groups or a counselor or social worker to discuss any feelings of anxiety, frustration, anger, loneliness, or depression. All of these feelings are normal, and dealing with these feelings can help you to feel more in control of your life and health. It can also help to talk to other people who are going through a similar experience. Another issue to consider is how to tell  your family and friends about your diagnosis. Explaining the disease can help others to understand what to expect and how they can help, now and in the future. This can be especially helpful for children and grandchildren, who may not be familiar with the condition. While many people are able to live alone in the early stages of dementia, you may need help with tasks  such as housekeeping, cooking, transportation, and paying bills. If possible, ask a friend or family member for help making plans to deal with these and other issues as dementia progresses. Occupational therapists, and sometimes speech pathologists, can help to set up your home to minimize confusion and keep you independent for as long as possible. It's also important to establish Power of Attorney and Health Care Proxy statuses early, before a financial or health care crisis happens. This involves completing paperwork to determine who can make decisions on your behalf if needed. In addition, you should discuss your preferences regarding issues that are likely to become important as your dementia worsens, including: ?Is health insurance available, and what does it cover? ?Where will I live? ?Who will make health care and end-of-life decisions if I can't make them for myself? ?Who will pay for care? A number of resources are available to assist in this type of planning.   For caregivers -- Dementia can also impose an enormous burden on families and other caregivers. People with dementia become less able to care for themselves as the condition progresses. If you are caring for someone with dementia, the following may help: ?Make a daily plan and prepare to be flexible if needed. ?Try to be patient when responding to repetitive questions, behaviors, or statements. ?Try not to argue or confront the person with dementia when they express mistaken ideas or facts. Change the subject or gently remind the person of an inaccuracy. Arguing or trying to convince a person of "the truth" is a natural reaction but it can be frustrating to all and can trigger unwanted behavior and feelings. ?Use memory aids such as writing out a list of daily activities, phone numbers, and instructions for usual tasks (ie, the telephone, microwave, etc). It may help if these are posted and easily visible so that the person need not  remember to look for the aids. ?Establish calm and consistent nighttime routines to manage behavioral problems, which are often worst at night. Leave a night light on in the person's bedroom. ?Avoid major changes to the home environment (for example, rearranging furniture). ?Employ safety measures in the home, such as putting locks on medicine cabinets, keeping furniture in the same place to prevent falls, reducing clutter, removing electrical appliances from the bathroom, installing grab bars in the bathroom, and setting the water heater below 120F. ?Help the person with personal care tasks as needed. It is not necessary to bathe every day, although a health care provider should be notified if the person develops sores in the mouth or genitals related to hygiene problems (eg, ill-fitting dentures, urine leakage). ?Speak slowly, present only one idea at a time, and be patient when waiting for responses. ?Encourage physical activity and exercise. Even a daily walk can help prevent physical decline and improve behavioral problems. ?Consider respite care. Respite care can provide a needed break and give you a chance to recharge. This is offered in many communities in the form of in-home care or adult day care. Caregiving can be an all-consuming experience, and it's essential to take time for yourself, take care of  your own medical problems, and arrange for breaks when you need them. ?See if your area has a support group for people caring for loved ones with dementia. It can help to talk with other people who understand what you are going through.

## 2022-05-15 ENCOUNTER — Telehealth: Payer: Self-pay | Admitting: Medical

## 2022-05-15 NOTE — Telephone Encounter (Signed)
I will have shane sign to fax back

## 2022-05-15 NOTE — Telephone Encounter (Signed)
Colletta Maryland from [facility] called and needs an order signed. They said they have faxed it twice. Once 9/5 and 9/21.

## 2022-05-22 ENCOUNTER — Other Ambulatory Visit (INDEPENDENT_AMBULATORY_CARE_PROVIDER_SITE_OTHER): Payer: Self-pay

## 2022-05-22 DIAGNOSIS — R413 Other amnesia: Secondary | ICD-10-CM

## 2022-05-22 DIAGNOSIS — Z0289 Encounter for other administrative examinations: Secondary | ICD-10-CM

## 2022-05-23 LAB — VITAMIN B12: Vitamin B-12: 782 pg/mL (ref 232–1245)

## 2022-05-26 ENCOUNTER — Telehealth: Payer: Self-pay | Admitting: Psychiatry

## 2022-05-26 NOTE — Telephone Encounter (Signed)
Mcarthur Rossetti Josem Kaufmann: [id number] exp. 05/26/22-06/25/22 sent to [facility] [phone]

## 2022-05-29 ENCOUNTER — Telehealth: Payer: Self-pay | Admitting: Medical

## 2022-05-29 NOTE — Telephone Encounter (Signed)
I had a home health order.  Apparently the family wants to extend orders.  We probably need to do a virtual visit this discuss progress so far, what he has been doing with home health and what his current needs are

## 2022-05-29 NOTE — Telephone Encounter (Signed)
Pt has an virtual on Wednesday with you

## 2022-05-30 ENCOUNTER — Encounter: Payer: Self-pay | Admitting: Internal Medicine

## 2022-05-31 ENCOUNTER — Ambulatory Visit (INDEPENDENT_AMBULATORY_CARE_PROVIDER_SITE_OTHER): Payer: Medicare HMO | Admitting: Medical

## 2022-05-31 ENCOUNTER — Encounter: Payer: Self-pay | Admitting: Medical

## 2022-05-31 VITALS — BP 110/68 | HR 60 | Temp 98.3°F | Wt 161.0 lb

## 2022-05-31 DIAGNOSIS — E611 Iron deficiency: Secondary | ICD-10-CM

## 2022-05-31 DIAGNOSIS — R32 Unspecified urinary incontinence: Secondary | ICD-10-CM

## 2022-05-31 DIAGNOSIS — D45 Polycythemia vera: Secondary | ICD-10-CM | POA: Diagnosis not present

## 2022-05-31 DIAGNOSIS — R7989 Other specified abnormal findings of blood chemistry: Secondary | ICD-10-CM | POA: Diagnosis not present

## 2022-05-31 DIAGNOSIS — R2689 Other abnormalities of gait and mobility: Secondary | ICD-10-CM | POA: Diagnosis not present

## 2022-05-31 DIAGNOSIS — I509 Heart failure, unspecified: Secondary | ICD-10-CM | POA: Diagnosis not present

## 2022-05-31 DIAGNOSIS — Z9989 Dependence on other enabling machines and devices: Secondary | ICD-10-CM

## 2022-05-31 DIAGNOSIS — R609 Edema, unspecified: Secondary | ICD-10-CM

## 2022-05-31 DIAGNOSIS — R278 Other lack of coordination: Secondary | ICD-10-CM | POA: Insufficient documentation

## 2022-05-31 DIAGNOSIS — Z91199 Patient's noncompliance with other medical treatment and regimen due to unspecified reason: Secondary | ICD-10-CM

## 2022-05-31 DIAGNOSIS — I130 Hypertensive heart and chronic kidney disease with heart failure and stage 1 through stage 4 chronic kidney disease, or unspecified chronic kidney disease: Secondary | ICD-10-CM

## 2022-05-31 DIAGNOSIS — Z789 Other specified health status: Secondary | ICD-10-CM

## 2022-05-31 DIAGNOSIS — D75839 Thrombocytosis, unspecified: Secondary | ICD-10-CM

## 2022-05-31 DIAGNOSIS — F40231 Fear of injections and transfusions: Secondary | ICD-10-CM

## 2022-05-31 DIAGNOSIS — R413 Other amnesia: Secondary | ICD-10-CM | POA: Diagnosis not present

## 2022-05-31 DIAGNOSIS — E559 Vitamin D deficiency, unspecified: Secondary | ICD-10-CM

## 2022-05-31 DIAGNOSIS — Z7409 Other reduced mobility: Secondary | ICD-10-CM

## 2022-05-31 DIAGNOSIS — I10 Essential (primary) hypertension: Secondary | ICD-10-CM

## 2022-05-31 DIAGNOSIS — R748 Abnormal levels of other serum enzymes: Secondary | ICD-10-CM

## 2022-05-31 DIAGNOSIS — N1831 Chronic kidney disease, stage 3a: Secondary | ICD-10-CM

## 2022-05-31 NOTE — Progress Notes (Signed)
I have submitted information to the Appeals Department to extend PT.  I also faxed over PT/OT orders and Lab orders to [facility]

## 2022-05-31 NOTE — Progress Notes (Signed)
Subjective:     Patient ID: James Bryant, male   DOB: [birth year], 76 y.o.   MRN: [id number]  This visit type was conducted due to national recommendations for restrictions regarding the COVID-19 Pandemic (e.g. social distancing) in an effort to limit this patient's exposure and mitigate transmission in our community.  Due to their co-morbid illnesses, this patient is at least at moderate risk for complications without adequate follow up.  This format is felt to be most appropriate for this patient at this time.    Documentation for virtual audio and video telecommunications through [place] encounter:  The patient was located at home. The provider was located in the office. The patient did consent to this visit and is aware of possible charges through their insurance for this visit.  The other persons participating in this telemedicine service were wife James Bryant. Time spent on call was 20 minutes and in review of previous records 20 minutes total.  This virtual service is not related to other E/M service within previous 7 days.   HPI Chief Complaint  Patient presents with   discuss [place]- concerns- needs additional PT- for help walking as he tends to lean on his side when walking and has to hold on thing. Needs help with posture as he walks bending down. His legs and feet are still swelling. Wife has keep him moving and trying to also elevate his legs.    Interview conducted today with wife James Bryant.    Their main concern is extending physical therapy home health.  He has been getting physical therapy for several weeks to months and still having some trouble.  Wife feels like he needs more time with therapy although insurance was going to decline this.  Physical therapist have been coming out to the house.  PT has been coming out twice weekly for past 1.5 months maybe as far back as August.    Family is requesting additional physical therapy.    Wife says he is  off balance, has to still holding onto wall.  Has walker, but still seems to have difficulty.  Leans forwards instead of standing more normal.     No current nurse aide coming in the house.   Does have home health nurse that comes out to the house checking vitals.  He requires help with all ADLs.  Wife helps with this.  He is able to do some toileting himself but wife helps.    He can feed himself with utensils.   Feet and legs still staying swollen.  He gets ups and moves around the house with walker.   Wife helps him nonstop. Uses compression socks, some days will use them, some days wont..  No recent chest pain or SOB, depends on his mood  Current weight is 161 pounds.  Weight last August 2022 in person here was 168 pounds.  He also has some cramping or decreased utility of his hands.  Wife thinks that since he is not using his hands often he is losing some of his strength or manipulation with his hands  He saw neurology recently for dementia concerns is having an MRI coming up soon next week.  He is also recently been started on a medication for memory  Wife is administering his medicines and says he is compliant with medication as listed.  Past Medical History:  Diagnosis Date   Elevated liver function tests 01/17/2019   Elevated serum creatinine 01/17/2019  Fear of needles 01/17/2019   Former smoker, stopped smoking many years ago 01/17/2019   Hypertension    Polycythemia 01/17/2019   Thrombocytosis 01/17/2019   Current Outpatient Medications on File Prior to Visit  Medication Sig Dispense Refill   ASPIRIN LOW DOSE 81 MG tablet TAKE 1 TABLET (81 MG TOTAL) BY MOUTH DAILY. 90 tablet 1   carvedilol (COREG) 6.25 MG tablet TAKE 1 TABLET TWICE DAILY (NEED MD APPOINTMENT) 180 tablet 3   donepezil (ARICEPT) 5 MG tablet Take 1 tablet (5 mg total) by mouth at bedtime. 30 tablet 6   ferrous gluconate (FERGON) 324 MG tablet TAKE 1 TABLET EVERY DAY WITH BREAKFAST 90 tablet 0   furosemide  (LASIX) 80 MG tablet Take 80 mg by mouth daily.     hydroxyurea (HYDREA) 500 MG capsule TAKE 1 CAPSULE (500 MG TOTAL) BY MOUTH 2 (TWO) TIMES DAILY. MAY TAKE WITH FOOD TO MINIMIZE GI SIDE EFFECTS. 180 capsule 3   LORazepam (ATIVAN) 0.5 MG tablet Take 1 tablet (0.5 mg total) by mouth 2 (two) times daily. 5 tablet 0   Multiple Vitamins-Minerals (MEGA MULTIVITAMIN FOR MEN PO) Take 1 tablet by mouth daily.     potassium chloride (KLOR-CON) 10 MEQ tablet TAKE 1 TABLET EVERY DAY 90 tablet 2   valsartan (DIOVAN) 160 MG tablet TAKE 1 TABLET EVERY DAY 90 tablet 0   No current facility-administered medications on file prior to visit.    Review of Systems As in subjective    Objective:   Physical Exam Due to coronavirus pandemic stay at home measures, patient visit was virtual and they were not examined in person.   BP 110/68   Pulse 60   Temp 98.3 F (36.8 C)   Wt 161 lb (73 kg)   SpO2 97%   BMI 25.22 kg/m   Wt Readings from Last 3 Encounters:  05/31/22 161 lb (73 kg)  05/11/22 161 lb (73 kg)  04/06/22 170 lb 6.4 oz (77.3 kg)   Gen: nad, seated      Assessment:     Encounter Diagnoses  Name Primary?   Memory loss Yes   Balance problem    Coordination abnormal    Congestive heart failure, NYHA class 2, unspecified congestive heart failure type (HCC)    Alkaline phosphatase elevation    Decreased activities of daily living (ADL)    Dependence on cane    Edema, unspecified type    Elevated liver function tests    Iron deficiency    Mobility impaired    Personal history of noncompliance with medical treatment, presenting hazards to health    Polycythemia vera ([place])    Primary hypertension    Severe needle phobia    Stage 3a chronic kidney disease (HCC)    Urinary incontinence, unspecified type    Thrombocytosis    Vitamin D deficiency        Plan:     We discussed his current status, wife's concerns and needs given that she is his caregiver and he requires full  care.  We will contact home health about doing some labs updated at home, phlebotomy draw  Continue plan for MRI of the brain next week per neurology  We will request additional home health physical therapy and possibly occupational therapy  Continue current medications, continue compression hose, leg elevation  Continue to use walker and be mindful of safety and avoidance of falls  Of note on hydroxy urea per hematology for thrombocytosis  I reviewed his  recent neurology consult notes regarding memory loss which has been significant in the past year+  Fahad was seen today for discuss home health.  Diagnoses and all orders for this visit:  Memory loss -     Comprehensive metabolic panel; Future -     VITAMIN D 25 Hydroxy (Vit-D Deficiency, Fractures); Future -     CBC with Differential/Platelet; Future -     TSH; Future  Balance problem  Coordination abnormal -     TSH; Future  Congestive heart failure, NYHA class 2, unspecified congestive heart failure type ([place]) -     Comprehensive metabolic panel; Future -     CBC with Differential/Platelet; Future -     Brain natriuretic peptide; Future  Alkaline phosphatase elevation  Decreased activities of daily living (ADL)  Dependence on cane  Edema, unspecified type  Elevated liver function tests  Iron deficiency -     CBC with Differential/Platelet; Future -     Iron, TIBC and Ferritin Panel; Future  Mobility impaired  Personal history of noncompliance with medical treatment, presenting hazards to health  Polycythemia vera ([place])  Primary hypertension -     Lipid panel; Future  Severe needle phobia  Stage 3a chronic kidney disease (HCC) -     Comprehensive metabolic panel; Future -     Urinalysis, Routine w reflex microscopic; Future  Urinary incontinence, unspecified type  Thrombocytosis -     CBC with Differential/Platelet; Future  Vitamin D deficiency -     VITAMIN D 25 Hydroxy (Vit-D Deficiency,  Fractures); Future    Follow up pending call back

## 2022-06-05 ENCOUNTER — Ambulatory Visit
Admission: RE | Admit: 2022-06-05 | Discharge: 2022-06-05 | Disposition: A | Discharge status: Home / Self Care | Payer: Medicare HMO | Source: Ambulatory Visit | Attending: Psychiatry | Admitting: Psychiatry

## 2022-06-05 DIAGNOSIS — R413 Other amnesia: Secondary | ICD-10-CM

## 2022-06-06 NOTE — Progress Notes (Signed)
I got a message from Iselin at [facility] and his appeal has been closed as it should of been done prior to 05/29/2022.  Case # [id number] James Bryant [phone]

## 2022-06-06 NOTE — Progress Notes (Signed)
I have sent over order last week for new orders so I have not heard back that they will not accept them, so as far as I know that is current and active

## 2022-06-07 ENCOUNTER — Telehealth: Payer: Self-pay | Admitting: Medical

## 2022-06-07 IMAGING — CT CT ABD-PELV W/O CM
1 of 2 series · 13 of 32 positions shown, 18 images · non-contrast
Comparison: None.

CLINICAL DATA: Fatigue. Elevated liver function test. Polycythemia
and elevated serum creatinine.

EXAM:
CT ABDOMEN AND PELVIS WITHOUT CONTRAST
TECHNIQUE: Multidetector CT imaging of the abdomen and pelvis was performed
following the standard protocol without IV contrast.

[Series 2: abd/pelvis w/(date) · axial · 0.74mm/px · z∈[-415,-25]mm · 13 of 88 slices shown, 18 images]
[im 5/88  soft-tissue]
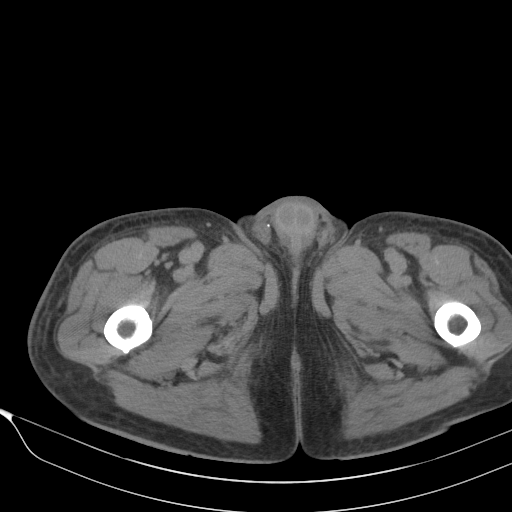
[im 5/88  bone]
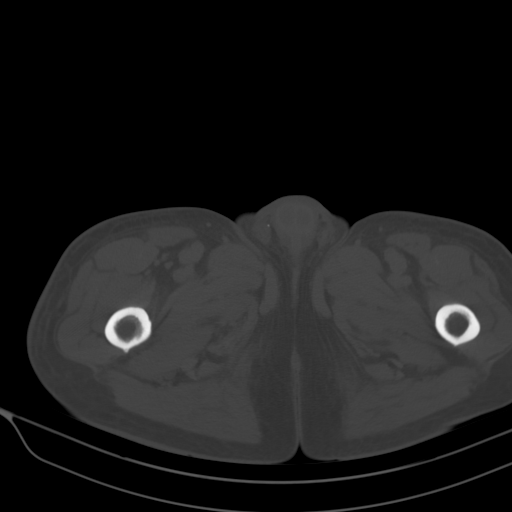
[im 14/88  soft-tissue]
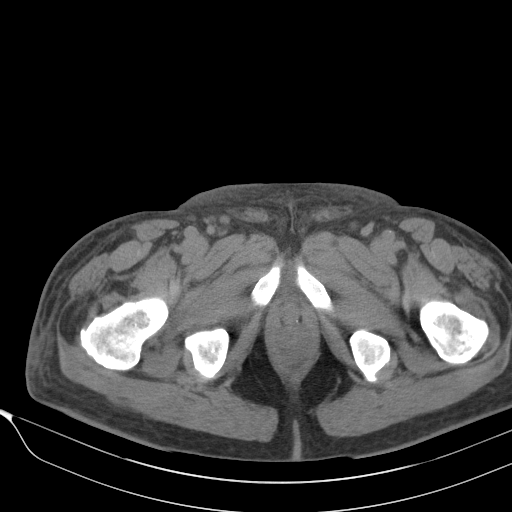
[im 19/88  soft-tissue]
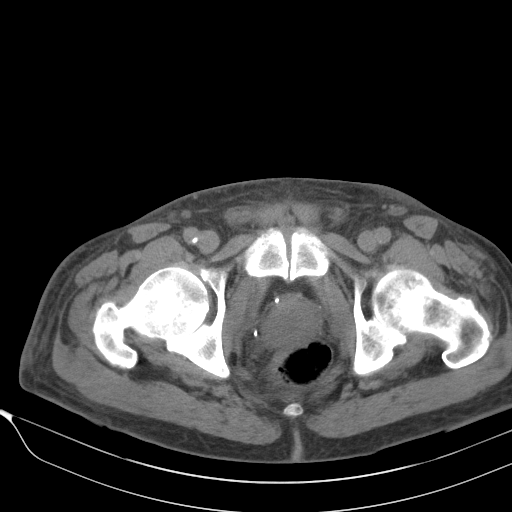
[im 28/88  soft-tissue]
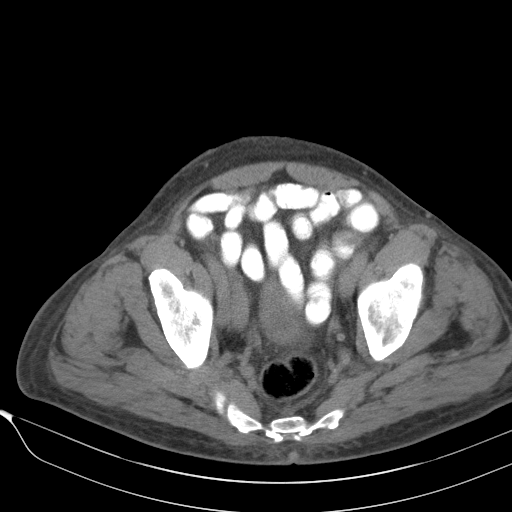
[im 33/88  soft-tissue]
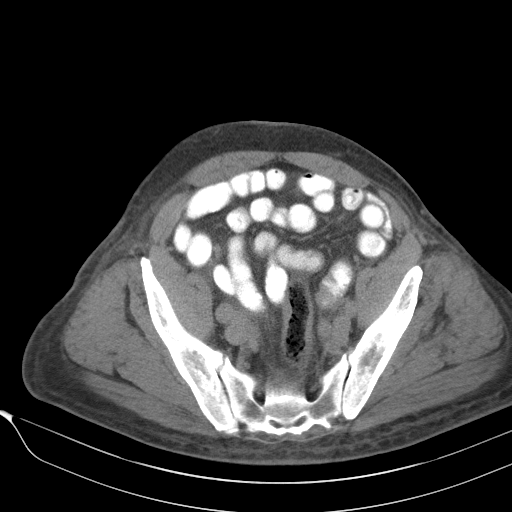
[im 42/88  soft-tissue]
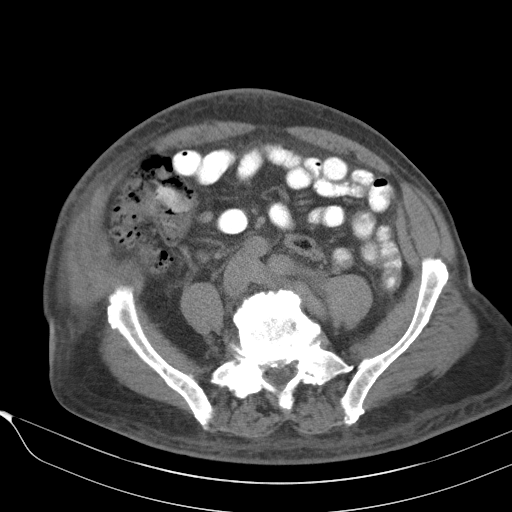
[im 46/88  soft-tissue]
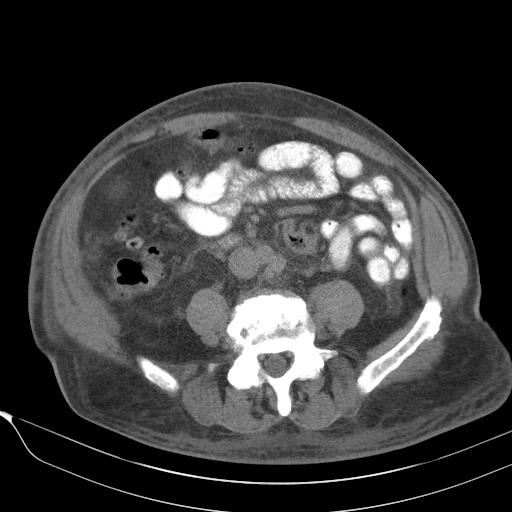
[im 55/88  soft-tissue]
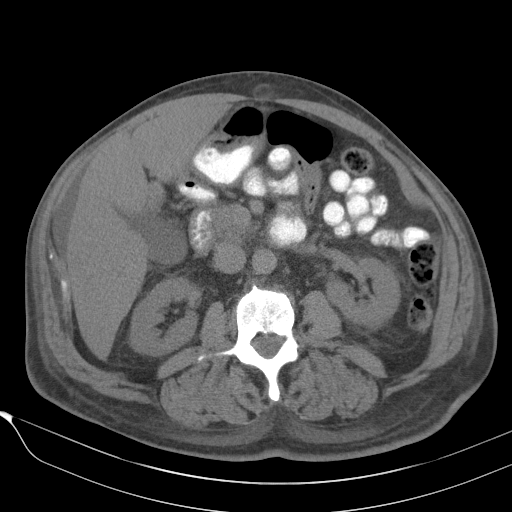
[im 60/88  soft-tissue]
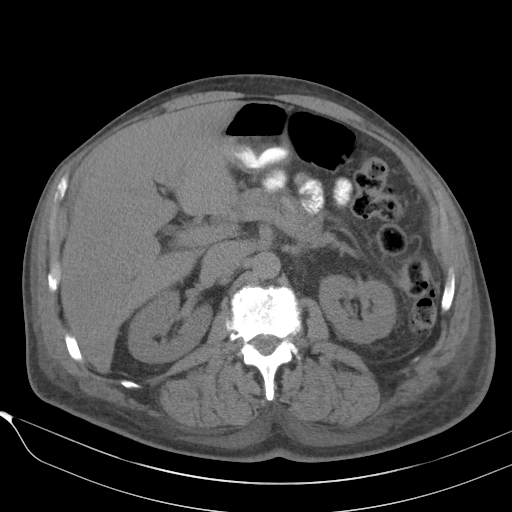
[im 60/88  bone]
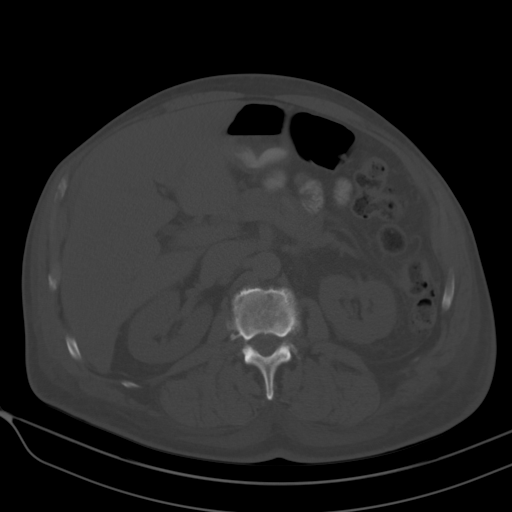
[im 69/88  soft-tissue]
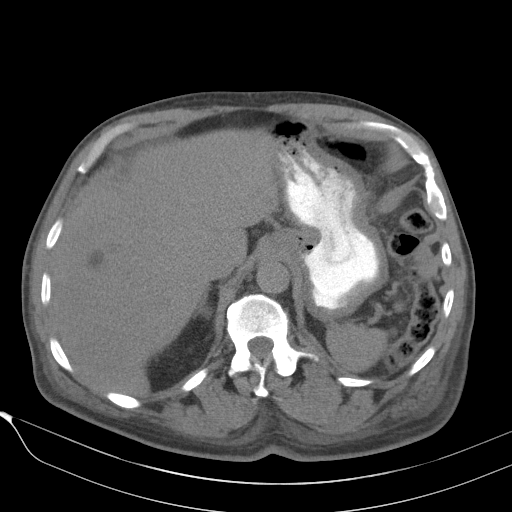
[im 69/88  lung]
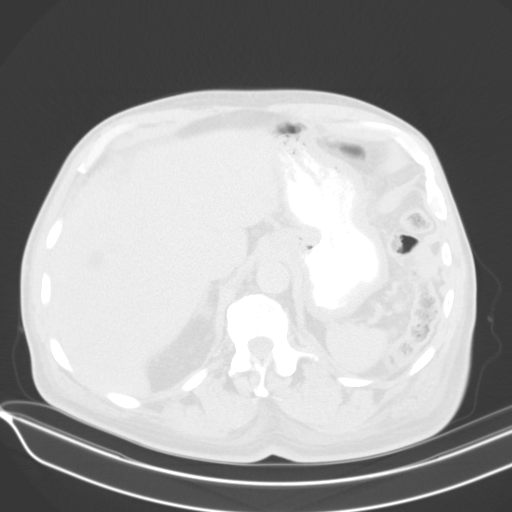
[im 74/88  soft-tissue]
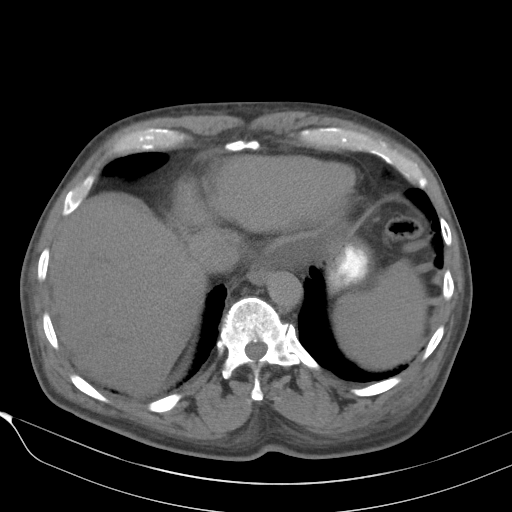
[im 74/88  lung]
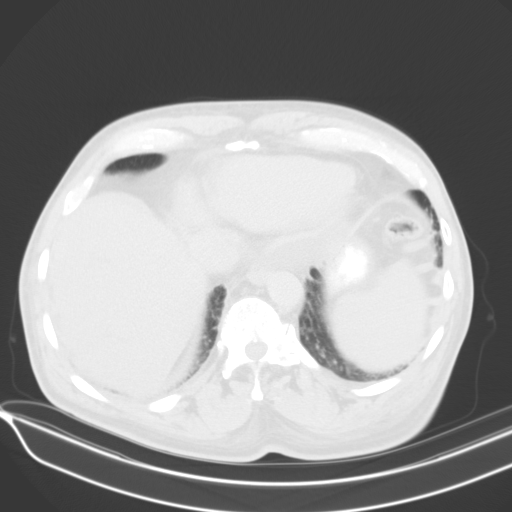
[im 78/88  lung]
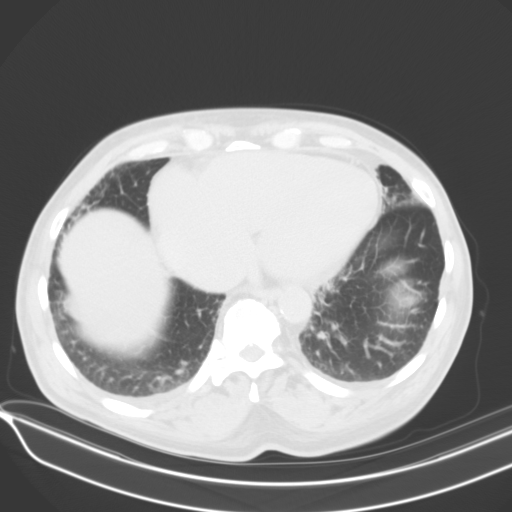
[im 83/88  soft-tissue]
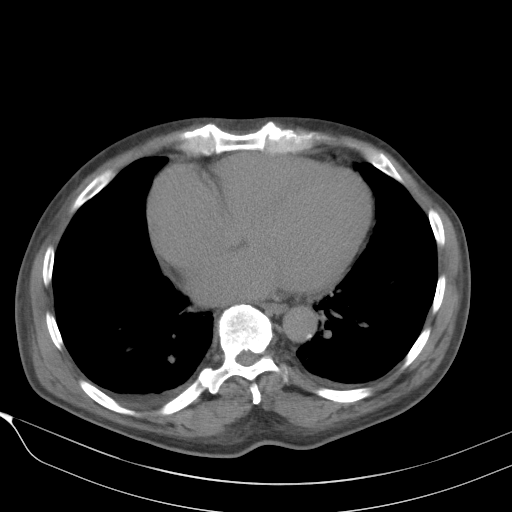
[im 83/88  lung]
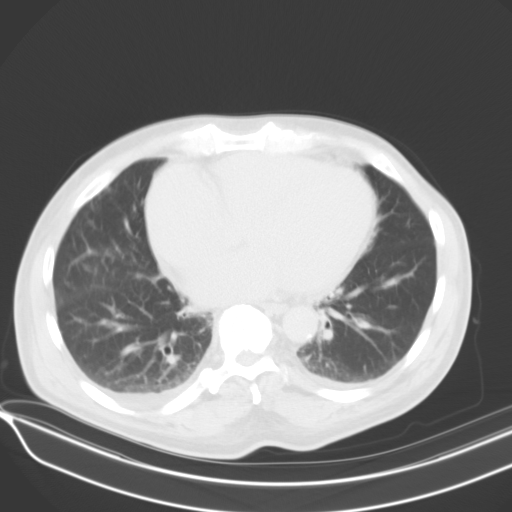

[13 of 32 positions shown; findings below may reference images not displayed]

FINDINGS: Lower chest: Heart size appears enlarged. Interlobular septal
thickening is identified within the lung bases suggestive of
interstitial edema. Trace bilateral pleural effusions.

Hepatobiliary: Right lobe of liver cyst measures 1.5 cm. A few
additional low-density liver foci are noted which measure less than
1 cm and are technically too small to reliably characterize. Stones
are noted within the gallbladder measuring up to 1.1 cm, image 38/2.
No definite signs of gallbladder wall thickening or inflammation. No
bile duct dilatation.

Pancreas: Unremarkable. No pancreatic ductal dilatation or
surrounding inflammatory changes.

Spleen: Normal in size without focal abnormality.

Adrenals/Urinary Tract: Adrenal glands are unremarkable. Kidneys are
normal, without renal calculi, focal lesion, or hydronephrosis.
Bladder wall thickening noted which may be due to incomplete
distension.

Stomach/Bowel: Stomach appears normal. The appendix is visualized
and appears within normal limits. No bowel wall thickening,
inflammation, or distension. Scattered colonic diverticula noted
without signs of acute diverticulitis.

Vascular/Lymphatic: Mild aortic atherosclerosis without aneurysm. No
signs of abdominopelvic adenopathy.

Reproductive: Prostate is unremarkable.

Other: Small volume of perihepatic ascites is identified, image
33/2. There is also a sub trace amount of free fluid within the
dependent portion of the pelvis. Mild diffuse edema is identified
within the mesentery and peritoneal fat. There is also diffuse body
wall edema consistent with anasarca.

Musculoskeletal: Lumbar degenerative disc disease is identified.
This is most advanced at L2-3, L4-5 and L5-S1.
IMPRESSION: 1. Cardiac enlargement, bilateral pleural effusions and interstitial
edema correlate for any clinical signs or symptoms of CHF.
2. Small volume of perihepatic ascites as well as mesenteric edema
and diffuse body wall edema consistent fluid overload state.
3. Gallstones.  No secondary signs of acute cholecystitis.
4. Lumbar degenerative disc disease.
5. Aortic atherosclerosis.

Aortic Atherosclerosis ([id number].V).

## 2022-06-07 IMAGING — CR DG CHEST 2V
2 series · 2 of 2 positions shown · non-contrast
Comparison: None.

CLINICAL DATA: Shortness of breath, history of tobacco use

EXAM:
CHEST - 2 VIEW

[w chest pa]
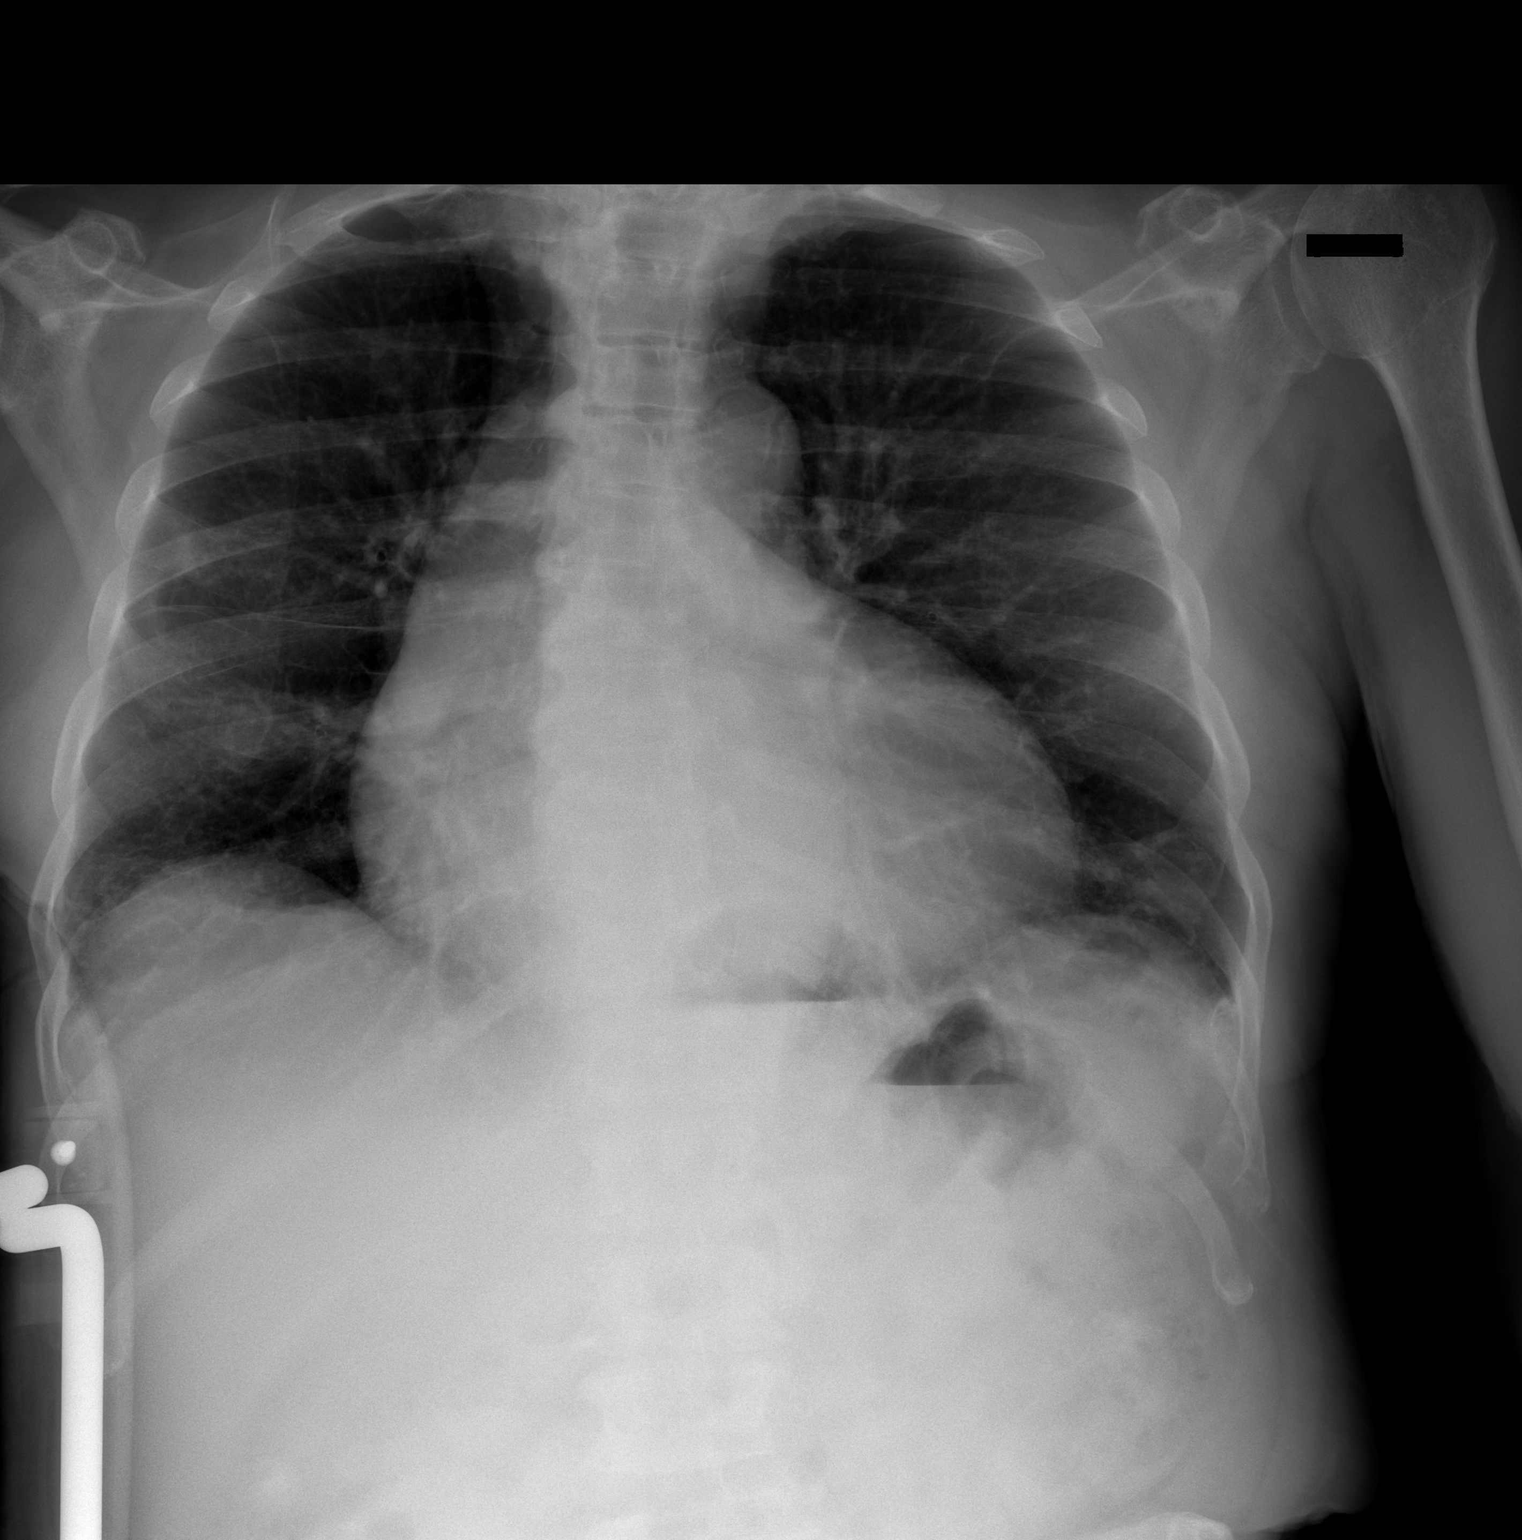

[w chest lat]
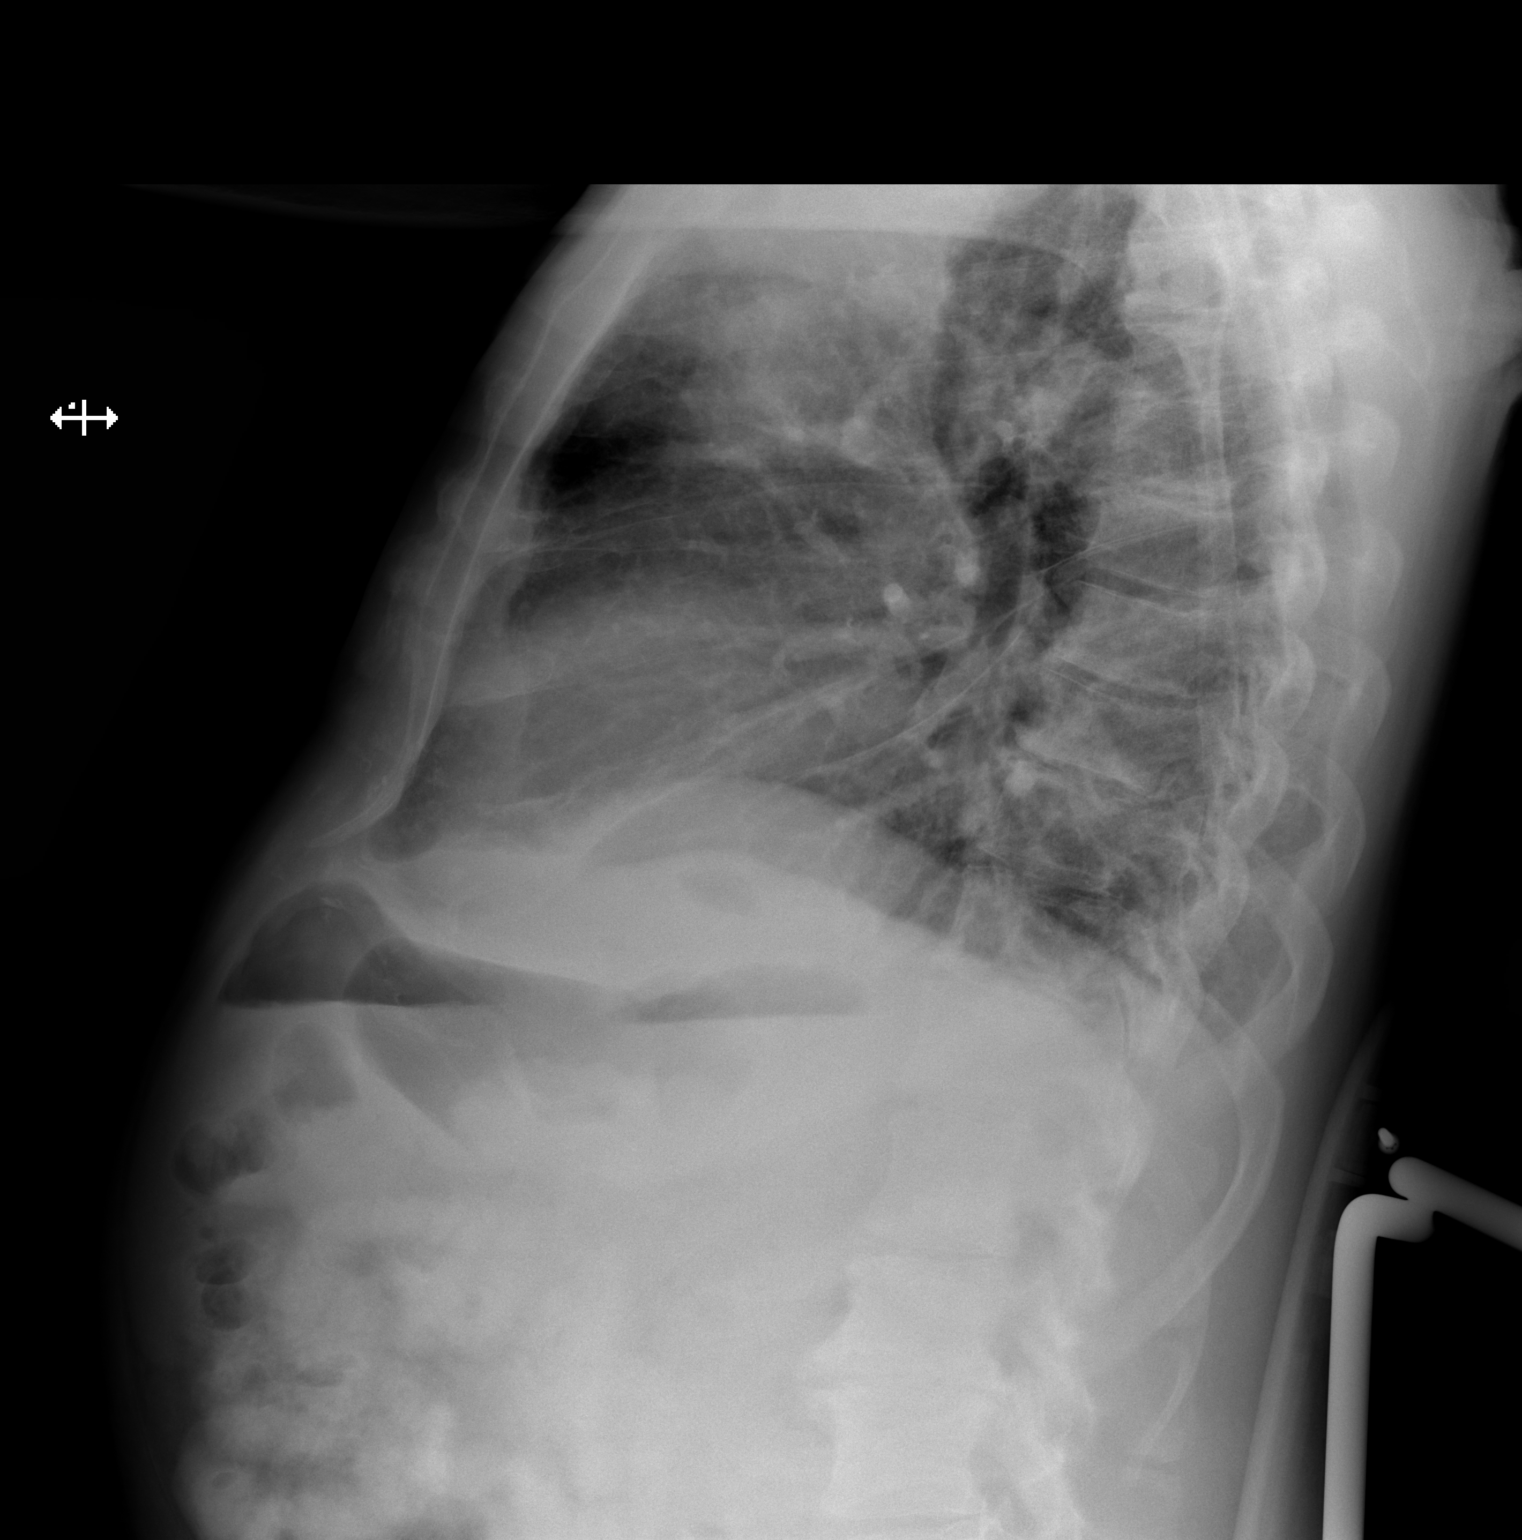

[2 of 2 positions shown; findings below may reference images not displayed]

FINDINGS: Cardiac shadow is enlarged. Lungs are well aerated bilaterally. No
focal infiltrate or sizable effusion is seen. No acute bony
abnormality is noted.
IMPRESSION: Cardiomegaly without acute abnormality.

## 2022-06-07 NOTE — Telephone Encounter (Signed)
I have not received anything from you from [facility].

## 2022-06-07 NOTE — Telephone Encounter (Signed)
I received a form from [facility].  Please attach his medication and problem list.  There is also a form for DNR.  I do not have anything like this on file.  They have a DNR at home?  DO NOT RESUSCITATE order, bright yellow paper

## 2022-06-09 NOTE — Telephone Encounter (Signed)
Pt wife will pick up today

## 2022-06-09 NOTE — Telephone Encounter (Signed)
Spoke with Wife Mallie Mussel, pt is only going to [facility] one day a week right now and she would like them to have a DNR on file and then she needs DNR at home. She thinks she has one at home in the mix of all her other paperwork but she would just like a new one for him

## 2022-06-23 ENCOUNTER — Telehealth: Payer: Self-pay | Admitting: Medical

## 2022-06-23 NOTE — Telephone Encounter (Signed)
Tried calling patient to schedule Medicare Annual Wellness Visit (AWV) either virtually or in office.   No answer    Last AWV  07/29/21 please schedule with Nurse Health Adviser   45 min for awv-i and in office appointments 30 min for awv-s  phone/virtual appointments

## 2022-07-10 ENCOUNTER — Telehealth: Payer: Self-pay | Admitting: Medical

## 2022-07-10 NOTE — Telephone Encounter (Signed)
Pt's spouse came in and states that pt is losing home health services as of 07/12/2022. She is hoping that you can help her get that continued and covered thru his insurance. She states that it is helping him but she thinks he could benefit from additional help.  She states that he is still unsteady with his balance  She states he does go to daycare Monday's only which also helps and she pays for that out of pocket. Please advise her at [phone] or her daughter, Sharlett Iles at [phone].   I am sending back the paper she received from [facility] home health.

## 2022-07-11 NOTE — Telephone Encounter (Signed)
Spoke to pt's wife and said PT stopped tomorrow coming out and he is still falling a lot as when PT leaves he will hold on to things and not hold good with walker. He doesn't have good movement throughout the day.  I am not sure if we extend this, or pt would need to go into a [facility] for rehab for a period of time to see if they can help pt. Wife is not sure patient will be willing to go into a rehab place for this and not sure if it would affect her income- just a thought. Please advise

## 2022-07-11 NOTE — Telephone Encounter (Signed)
Pt's wife was notified  

## 2022-07-26 ENCOUNTER — Telehealth: Payer: Self-pay | Admitting: Medical

## 2022-07-26 ENCOUNTER — Other Ambulatory Visit: Payer: Self-pay | Admitting: Physician Assistant

## 2022-07-26 NOTE — Telephone Encounter (Signed)
Pt wife called, she was under the impression that you were sending someone out to do blood work  Nurses from [facility] come out to see him but per wife they have not done bloodwork  Wife states she can't get him in the office. She said since he does not have therapy anymore, he does not try to do exercises or go outside. He only walk short distances in the house   Wife states to call daughter back

## 2022-08-03 ENCOUNTER — Ambulatory Visit: Payer: Medicare HMO | Admitting: Podiatry

## 2022-08-03 DIAGNOSIS — M79675 Pain in left toe(s): Secondary | ICD-10-CM

## 2022-08-03 DIAGNOSIS — L853 Xerosis cutis: Secondary | ICD-10-CM | POA: Diagnosis not present

## 2022-08-03 DIAGNOSIS — M79674 Pain in right toe(s): Secondary | ICD-10-CM

## 2022-08-03 DIAGNOSIS — B351 Tinea unguium: Secondary | ICD-10-CM

## 2022-08-03 NOTE — Progress Notes (Signed)
  Subjective:  Patient ID: James Bryant, male    DOB: [birth year],  MRN: [id number]  Chief Complaint  Patient presents with   Foot Problem    Nail trim/ callouses     76 y.o. male presents with the above complaint. History confirmed with patient. Patient presenting with pain related to dystrophic thickened elongated nails. Patient is unable to trim own nails related to nail dystrophy and/or mobility issues. Patient does not have a history of T2DM. No calluses causing pain.  Objective:  Physical Exam: warm, good capillary refill nail exam onychomycosis of the toenails, onycholysis, and dystrophic nails DP pulses palpable, PT pulses palpable, and protective sensation intact Left Foot:  Pain with palpation of nails due to elongation and dystrophic growth.  Right Foot: Pain with palpation of nails due to elongation and dystrophic growth.   Assessment:   1. Pain due to onychomycosis of toenails of both feet   2. Xerosis of skin      Plan:  Patient was evaluated and treated and all questions answered.   #Onychomycosis with pain  -Nails palliatively debrided as below. -Educated on self-care  Procedure: Nail Debridement Rationale: Pain Type of Debridement: manual, sharp debridement. Instrumentation: Nail nipper, rotary burr. Number of Nails: 10  No follow-ups on file.         Everitt Amber, DPM [facility]

## 2022-08-22 ENCOUNTER — Telehealth: Payer: Self-pay | Admitting: Internal Medicine

## 2022-08-22 DIAGNOSIS — R278 Other lack of coordination: Secondary | ICD-10-CM

## 2022-08-22 DIAGNOSIS — R2689 Other abnormalities of gait and mobility: Secondary | ICD-10-CM

## 2022-08-22 NOTE — Telephone Encounter (Signed)
Daughter called and would like to have PT for pt due to ADL's, Decline in balance and swelling in legs

## 2022-08-23 ENCOUNTER — Other Ambulatory Visit: Payer: Self-pay | Admitting: Physician Assistant

## 2022-08-23 NOTE — Telephone Encounter (Signed)
I have put in an order for PT from home health

## 2022-08-31 ENCOUNTER — Ambulatory Visit (INDEPENDENT_AMBULATORY_CARE_PROVIDER_SITE_OTHER): Payer: Medicare HMO | Admitting: Medical

## 2022-08-31 VITALS — BP 128/72 | HR 60 | Resp 18 | Wt 184.4 lb

## 2022-08-31 DIAGNOSIS — E559 Vitamin D deficiency, unspecified: Secondary | ICD-10-CM | POA: Diagnosis not present

## 2022-08-31 DIAGNOSIS — I10 Essential (primary) hypertension: Secondary | ICD-10-CM

## 2022-08-31 DIAGNOSIS — D75839 Thrombocytosis, unspecified: Secondary | ICD-10-CM

## 2022-08-31 DIAGNOSIS — R278 Other lack of coordination: Secondary | ICD-10-CM | POA: Diagnosis not present

## 2022-08-31 DIAGNOSIS — R0602 Shortness of breath: Secondary | ICD-10-CM | POA: Diagnosis not present

## 2022-08-31 DIAGNOSIS — I509 Heart failure, unspecified: Secondary | ICD-10-CM | POA: Diagnosis not present

## 2022-08-31 DIAGNOSIS — N1831 Chronic kidney disease, stage 3a: Secondary | ICD-10-CM | POA: Diagnosis not present

## 2022-08-31 DIAGNOSIS — R8290 Unspecified abnormal findings in urine: Secondary | ICD-10-CM

## 2022-08-31 DIAGNOSIS — R413 Other amnesia: Secondary | ICD-10-CM | POA: Diagnosis not present

## 2022-08-31 DIAGNOSIS — E611 Iron deficiency: Secondary | ICD-10-CM

## 2022-08-31 DIAGNOSIS — R29810 Facial weakness: Secondary | ICD-10-CM

## 2022-08-31 LAB — LIPID PANEL

## 2022-08-31 LAB — MICROSCOPIC EXAMINATION

## 2022-08-31 MED ORDER — ALBUTEROL SULFATE (2.5 MG/3ML) 0.083% IN NEBU
2.5000 mg | INHALATION_SOLUTION | Freq: Once | RESPIRATORY_TRACT | Status: AC
  Administered 2022-08-31: 2.5 mg via RESPIRATORY_TRACT

## 2022-08-31 NOTE — Progress Notes (Signed)
Subjective:  James Bryant is a 77 y.o. male who presents for Chief Complaint  Patient presents with   Follow-up    Right side of face drooping, started couple of months ago, Also right and left hands are shaking, esp when he eats. Walking is more difficult lately as well.     His wife Roc Streett brings him in along with family member Pearl.    His medical history is significant for polycythemia, thrombocytosis, hypertension, bad fear of needles, former smoker, cognitive decline, memory loss.    Hands seem puffy, legs seems weak. Facial droop started sometime in last month.  Wife notes that he was drooling when the facial weakness started, hands started shaking after that time.   Shakes a lot holding spoon.   Lately having wheezing, throat clearing, some difficulty breathing.  Wife hears wheezing at night for sure.  He has oxygen at night prn but hasn't been using it.   Has old combo inhaler but doesn't do all that well with inspiratory effort to use it.  Last smoked decades ago.    Having swelling in hands and legs.    Wife is primarily taking care of him. He needs help transferring in and out of chair, but uses walker to walk to bathroom.   Has some incontinence.   Feed himself for the most part, but the hand shaking interferes.  Is on Olanzapine '5mg'$  daily in evening for hallucinations.  Originally prescribed by [facility].     Hospice is currently coming out 2 times per week.  Been getting hospice care for last several month.   She feels he needs additional help with therapy.   New order was placed by [place] recently and they came out this week.  No other aggravating or relieving factors.    No other c/o.  Past Medical History:  Diagnosis Date   Elevated liver function tests 01/17/2019   Elevated serum creatinine 01/17/2019   Fear of needles 01/17/2019   Former smoker, stopped smoking many years ago 01/17/2019   Hypertension    Polycythemia 01/17/2019   Thrombocytosis  01/17/2019   Current Outpatient Medications on File Prior to Visit  Medication Sig Dispense Refill   ASPIRIN LOW DOSE 81 MG tablet TAKE 1 TABLET (81 MG TOTAL) BY MOUTH DAILY. 90 tablet 1   brimonidine (ALPHAGAN) 0.2 % ophthalmic solution SMARTSIG:In Eye(s)     carvedilol (COREG) 6.25 MG tablet TAKE 1 TABLET TWICE DAILY (NEED MD APPOINTMENT) 180 tablet 3   donepezil (ARICEPT) 5 MG tablet Take 1 tablet (5 mg total) by mouth at bedtime. 30 tablet 6   ferrous gluconate (FERGON) 324 MG tablet TAKE 1 TABLET EVERY DAY WITH BREAKFAST 90 tablet 0   hydroxyurea (HYDREA) 500 MG capsule TAKE 1 CAPSULE (500 MG TOTAL) BY MOUTH 2 (TWO) TIMES DAILY. MAY TAKE WITH FOOD TO MINIMIZE GI SIDE EFFECTS. 180 capsule 3   Multiple Vitamins-Minerals (MEGA MULTIVITAMIN FOR MEN PO) Take 1 tablet by mouth daily.     OLANZapine (ZYPREXA) 5 MG tablet Take 5 mg by mouth at bedtime. Take one tablet nightly at 7 pm     valsartan (DIOVAN) 160 MG tablet Take 1 tablet (160 mg total) by mouth daily. 90 tablet 0   LORazepam (ATIVAN) 0.5 MG tablet Take 1 tablet (0.5 mg total) by mouth 2 (two) times daily. (Patient not taking: Reported on 08/31/2022) 5 tablet 0   nystatin cream (MYCOSTATIN) Apply 1 Application topically as needed. (Patient not taking: Reported on 08/31/2022)  No current facility-administered medications on file prior to visit.    The following portions of the patient's history were reviewed and updated as appropriate: allergies, current medications, past family history, past medical history, past social history, past surgical history and problem list.  ROS Otherwise as in subjective above    Objective: BP 128/72   Pulse 60   Resp 18   Wt 184 lb 6.4 oz (83.6 kg)   SpO2 94%   BMI 28.88 kg/m   General appearance: alert, well developed, well nourished HEENT: normocephalic, sclerae anicteric, conjunctiva pink and moist, TMs pearly, nares patent, no discharge or erythema, pharynx normal Oral cavity: MMM, no  lesions Neck: supple, no lymphadenopathy, no thyromegaly, no masses, no JVD Heart: RRR, normal S1, S2, no murmurs Lungs: decreased breath sounds, some wheezes faint noted, no rhonchi, or rales Abdomen: +bs, soft, non tender, non distended, no masses, no hepatomegaly, no splenomegaly Pulses: 2+ radial pulses, 2+ pedal pulses, normal cap refill Ext: 1+ bilat edema Neuro: Right facial weakness noted, seems to be a slight tremor with motion of his hands bilaterally, otherwise strength relatively okay, he does require assistance getting in and out of the chair but he is able to walk holding onto a walker but slowly, otherwise nonfocal  psych: At times seems lethargic but at other times he does answer my questions but his judgment is not normal.  He feels like he should be able to drive a car and asked me about permission to drive again although clearly he is not in shape to do so    Assessment: Encounter Diagnoses  Name Primary?   Memory loss Yes   Congestive heart failure, NYHA class 2, unspecified congestive heart failure type (HCC)    Coordination abnormal    Primary hypertension    Stage 3a chronic kidney disease (HCC)    Iron deficiency    Thrombocytosis    Vitamin D deficiency    Facial droop    Shortness of breath    Abnormal urinalysis      Plan: We discussed his current state of health.  Based on his facial droop and overall weakness he very likely had a stroke within the last month  We discussed that he is currently on hospice care.  Given some of his symptom concerns today we will do some additional evaluation but we also discussed what treatment recommendations may bit like in terms of the fact that he is still getting hospice care and not palliative care or other full scope of treatment for new concerns  He has a history of heart failure and given his current symptoms he could be experiencing some heart failure symptoms.  He also is wheezy.  His initial pulse ox ranged from  88-95 but seem to stabilize around 95% on room air.  I did albuterol breathing treatment in house and he did seem to get some good response to this.  Wife has a pulse oximetry device and will monitor his pulse ox hourly this evening and then once or twice over the nighttime.  And let me know tomorrow what his oxygen's are running.  He has an oxygen generator at home but not using it regularly.  He may need to be on some oxygen particularly if nocturnal hypoxia or if he is staying under 88% on room air.  If not we might want to get him a home nebulizer and albuterol therapy  We will get labs and reach out to home health to discuss potential  treatment options or next steps given his symptoms.  He inquired about being able to drive again, wants to be able to drive now.  I advised that that is not an option currently.  He was very weak today in general and requires others for ambulation and advised that this is not a possibility   Gaston was seen today for follow-up.  Diagnoses and all orders for this visit:  Memory loss -     TSH -     CBC with Differential/Platelet -     VITAMIN D 25 Hydroxy (Vit-D Deficiency, Fractures) -     Comprehensive metabolic panel  Congestive heart failure, NYHA class 2, unspecified congestive heart failure type (HCC) -     Brain natriuretic peptide -     CBC with Differential/Platelet -     Comprehensive metabolic panel  Coordination abnormal -     TSH  Primary hypertension -     Lipid panel  Stage 3a chronic kidney disease (HCC) -     Urinalysis, Routine w reflex microscopic -     Comprehensive metabolic panel  Iron deficiency -     Iron, TIBC and Ferritin Panel -     CBC with Differential/Platelet  Thrombocytosis -     CBC with Differential/Platelet  Vitamin D deficiency -     VITAMIN D 25 Hydroxy (Vit-D Deficiency, Fractures)  Facial droop  Shortness of breath -     albuterol (PROVENTIL) (2.5 MG/3ML) 0.083% nebulizer solution 2.5  mg  Abnormal urinalysis -     Microscopic Examination    Follow up: pending labs

## 2022-09-01 ENCOUNTER — Telehealth: Payer: Self-pay | Admitting: Internal Medicine

## 2022-09-01 ENCOUNTER — Other Ambulatory Visit: Payer: Self-pay | Admitting: Medical

## 2022-09-01 DIAGNOSIS — F0390 Unspecified dementia without behavioral disturbance: Secondary | ICD-10-CM | POA: Diagnosis not present

## 2022-09-01 DIAGNOSIS — F40231 Fear of injections and transfusions: Secondary | ICD-10-CM | POA: Diagnosis not present

## 2022-09-01 DIAGNOSIS — I509 Heart failure, unspecified: Secondary | ICD-10-CM | POA: Diagnosis not present

## 2022-09-01 DIAGNOSIS — I428 Other cardiomyopathies: Secondary | ICD-10-CM | POA: Diagnosis not present

## 2022-09-01 DIAGNOSIS — R32 Unspecified urinary incontinence: Secondary | ICD-10-CM | POA: Diagnosis not present

## 2022-09-01 DIAGNOSIS — I130 Hypertensive heart and chronic kidney disease with heart failure and stage 1 through stage 4 chronic kidney disease, or unspecified chronic kidney disease: Secondary | ICD-10-CM | POA: Diagnosis not present

## 2022-09-01 DIAGNOSIS — N1831 Chronic kidney disease, stage 3a: Secondary | ICD-10-CM | POA: Diagnosis not present

## 2022-09-01 DIAGNOSIS — D75839 Thrombocytosis, unspecified: Secondary | ICD-10-CM | POA: Diagnosis not present

## 2022-09-01 DIAGNOSIS — D45 Polycythemia vera: Secondary | ICD-10-CM | POA: Diagnosis not present

## 2022-09-01 LAB — URINALYSIS, ROUTINE W REFLEX MICROSCOPIC
Glucose, UA: NEGATIVE
Ketones, UA: NEGATIVE
Nitrite, UA: NEGATIVE
RBC, UA: NEGATIVE
Specific Gravity, UA: 1.017 (ref 1.005–1.030)
Urobilinogen, Ur: 4 mg/dL — ABNORMAL HIGH (ref 0.2–1.0)
pH, UA: 5.5 (ref 5.0–7.5)

## 2022-09-01 LAB — COMPREHENSIVE METABOLIC PANEL
ALT: 15 IU/L (ref 0–44)
AST: 34 IU/L (ref 0–40)
Albumin/Globulin Ratio: 0.9 — ABNORMAL LOW (ref 1.2–2.2)
Albumin: 3.5 g/dL — ABNORMAL LOW (ref 3.8–4.8)
Alkaline Phosphatase: 173 IU/L — ABNORMAL HIGH (ref 44–121)
BUN/Creatinine Ratio: 12 (ref 10–24)
BUN: 20 mg/dL (ref 8–27)
Bilirubin Total: 3.7 mg/dL — ABNORMAL HIGH (ref 0.0–1.2)
CO2: 25 mmol/L (ref 20–29)
Calcium: 9.1 mg/dL (ref 8.6–10.2)
Chloride: 102 mmol/L (ref 96–106)
Creatinine, Ser: 1.65 mg/dL — ABNORMAL HIGH (ref 0.76–1.27)
Globulin, Total: 3.7 g/dL (ref 1.5–4.5)
Glucose: 109 mg/dL — ABNORMAL HIGH (ref 70–99)
Potassium: 3.5 mmol/L (ref 3.5–5.2)
Sodium: 144 mmol/L (ref 134–144)
Total Protein: 7.2 g/dL (ref 6.0–8.5)
eGFR: 42 mL/min/{1.73_m2} — ABNORMAL LOW (ref >59)

## 2022-09-01 LAB — CBC WITH DIFFERENTIAL/PLATELET
Basophils Absolute: 0.1 10*3/uL (ref 0.0–0.2)
Basos: 2 %
EOS (ABSOLUTE): 0 10*3/uL (ref 0.0–0.4)
Eos: 1 %
Hematocrit: 34.9 % — ABNORMAL LOW (ref 37.5–51.0)
Hemoglobin: 12.3 g/dL — ABNORMAL LOW (ref 13.0–17.7)
Immature Grans (Abs): 0 10*3/uL (ref 0.0–0.1)
Immature Granulocytes: 1 %
Lymphocytes Absolute: 0.9 10*3/uL (ref 0.7–3.1)
Lymphs: 25 %
MCH: 39.3 pg — ABNORMAL HIGH (ref 26.6–33.0)
MCHC: 35.2 g/dL (ref 31.5–35.7)
MCV: 112 fL — ABNORMAL HIGH (ref 79–97)
Monocytes Absolute: 0.4 10*3/uL (ref 0.1–0.9)
Monocytes: 10 %
NRBC: 1 % — ABNORMAL HIGH (ref 0–0)
Neutrophils Absolute: 2.3 10*3/uL (ref 1.4–7.0)
Neutrophils: 61 %
Platelets: 134 10*3/uL — ABNORMAL LOW (ref 150–450)
RBC: 3.13 x10E6/uL — ABNORMAL LOW (ref 4.14–5.80)
RDW: 15.2 % (ref 11.6–15.4)
WBC: 3.7 10*3/uL (ref 3.4–10.8)

## 2022-09-01 LAB — LIPID PANEL
Chol/HDL Ratio: 3.7 ratio (ref 0.0–5.0)
Cholesterol, Total: 95 mg/dL — ABNORMAL LOW (ref 100–199)
HDL: 26 mg/dL — ABNORMAL LOW (ref >39)
LDL Chol Calc (NIH): 57 mg/dL (ref 0–99)
Triglycerides: 45 mg/dL (ref 0–149)
VLDL Cholesterol Cal: 12 mg/dL (ref 5–40)

## 2022-09-01 LAB — IRON,TIBC AND FERRITIN PANEL
Ferritin: 128 ng/mL (ref 30–400)
Iron Saturation: 25 % (ref 15–55)
Iron: 70 ug/dL (ref 38–169)
Total Iron Binding Capacity: 278 ug/dL (ref 250–450)
UIBC: 208 ug/dL (ref 111–343)

## 2022-09-01 LAB — MICROSCOPIC EXAMINATION
Bacteria, UA: NONE SEEN
Casts: NONE SEEN /lpf
RBC, Urine: NONE SEEN /hpf (ref 0–2)

## 2022-09-01 LAB — VITAMIN D 25 HYDROXY (VIT D DEFICIENCY, FRACTURES): Vit D, 25-Hydroxy: 59.1 ng/mL (ref 30.0–100.0)

## 2022-09-01 LAB — BRAIN NATRIURETIC PEPTIDE: BNP: 4276.1 pg/mL — ABNORMAL HIGH (ref 0.0–100.0)

## 2022-09-01 LAB — TSH: TSH: 2.46 u[IU]/mL (ref 0.450–4.500)

## 2022-09-01 MED ORDER — POTASSIUM CHLORIDE ER 10 MEQ PO TBCR: 10.0000 meq | EXTENDED_RELEASE_TABLET | Freq: Two times a day (BID) | ORAL | 0 refills | Status: DC

## 2022-09-01 MED ORDER — FUROSEMIDE 80 MG PO TABS: 80.0000 mg | ORAL_TABLET | Freq: Two times a day (BID) | ORAL | 0 refills | Status: DC

## 2022-09-01 NOTE — Progress Notes (Signed)
Please call wife Nida.  I spoke to [place] with hospice.  They are going to try to get him nebulized machine and albuterol today to use albuterol 2 or 3 times a day as needed to help with shortness of breath.  For now continue current medications.  I am still pending one of his blood test for heart failure   FYI I called and spoke to [facility] in [place], . Spoke to a case Education officer, museum.  I discussed that I saw him yesterday in the office, that he likely is having some worse heart failure, he likely had a stroke in the last month, he had an albuterol treatment here and it seemed to help his lung sounds.  We discussed the limitations of being able to fail hospice versus going off hospice with other care.  They are going to reach out to the family and see how they want to move forward.  Yesterday the family seem to want to treat or do other interventions which would cease hospice care.  For example if he continues with physical therapy or if he goes and does other interventions as far as CT scan of head to evaluate for stroke or other then we are going back into treatment other than what hospice is designed to take care of.    He apparently is still a full code and not a DNR  I did recommend nebulized albuterol and continue oxygen as needed.  Right now he is not using oxygen very much but does have oxygen at home   Hospice is going to try to get him a nebulizer and albuterol today to start using it 2-3 times a day as needed.  He can continue oxygen as needed if oxygen is running low  After hospice has a discussion with the family on how they want to move forward with his overall care they will let me know

## 2022-09-01 NOTE — Telephone Encounter (Signed)
James Bryant with [facility] called and asked for a verbal order for PT. We sent in order the otherday for PT so I oked this verbal

## 2022-09-01 NOTE — Progress Notes (Signed)
Still pending BNP heart marker.  Rest of labs show some protein in the urine, blood counts show anemia but relatively stable in that regard, kidney marker showing chronic kidney disease but stable, bilirubin and alkaline phosphatase were abnormal, albumin a little low as well,   thyroid okay, iron levels okay, vitamin D okay  We will call with the additional BNP blood test result  Asked wife what were the oxygen readings yesterday afternoon and then last night?  Whether any less than 88%?  What was he averaging?  Who is her main contact that I should call at [place]?

## 2022-09-01 NOTE — Progress Notes (Signed)
See earlier message from today that I sent.  I cannot recall who I sent the message to  As suspected his heart failure marker is elevated.  I recommend changing Lasix to twice a day and changing potassium to twice a day.  I will send his medications.  See earlier message from today  Expect a phone call from hospice probably Monday to discuss some additional things that I and the  caseworker discussed today

## 2022-09-05 ENCOUNTER — Other Ambulatory Visit: Payer: Self-pay | Admitting: Internal Medicine

## 2022-09-05 DIAGNOSIS — I509 Heart failure, unspecified: Secondary | ICD-10-CM

## 2022-09-05 DIAGNOSIS — R278 Other lack of coordination: Secondary | ICD-10-CM

## 2022-09-06 ENCOUNTER — Other Ambulatory Visit: Payer: Self-pay | Admitting: Internal Medicine

## 2022-09-06 DIAGNOSIS — D45 Polycythemia vera: Secondary | ICD-10-CM | POA: Diagnosis not present

## 2022-09-06 DIAGNOSIS — I130 Hypertensive heart and chronic kidney disease with heart failure and stage 1 through stage 4 chronic kidney disease, or unspecified chronic kidney disease: Secondary | ICD-10-CM | POA: Diagnosis not present

## 2022-09-06 DIAGNOSIS — D75839 Thrombocytosis, unspecified: Secondary | ICD-10-CM | POA: Diagnosis not present

## 2022-09-06 DIAGNOSIS — I428 Other cardiomyopathies: Secondary | ICD-10-CM | POA: Diagnosis not present

## 2022-09-06 DIAGNOSIS — I509 Heart failure, unspecified: Secondary | ICD-10-CM | POA: Diagnosis not present

## 2022-09-06 DIAGNOSIS — F40231 Fear of injections and transfusions: Secondary | ICD-10-CM | POA: Diagnosis not present

## 2022-09-06 DIAGNOSIS — N1831 Chronic kidney disease, stage 3a: Secondary | ICD-10-CM | POA: Diagnosis not present

## 2022-09-06 DIAGNOSIS — F0390 Unspecified dementia without behavioral disturbance: Secondary | ICD-10-CM | POA: Diagnosis not present

## 2022-09-06 DIAGNOSIS — R32 Unspecified urinary incontinence: Secondary | ICD-10-CM | POA: Diagnosis not present

## 2022-09-06 MED ORDER — ALBUTEROL SULFATE (2.5 MG/3ML) 0.083% IN NEBU: 2.5000 mg | INHALATION_SOLUTION | Freq: Four times a day (QID) | RESPIRATORY_TRACT | 1 refills | Status: DC | PRN

## 2022-09-07 ENCOUNTER — Telehealth: Payer: Self-pay | Admitting: Medical

## 2022-09-07 NOTE — Telephone Encounter (Signed)
Pt's wife called and states that pt was receiving this medication thru [facility] but now she needs it refilled thru you since that care has stopped. Please send Olanzapine 5 mg to [facility] at [address]. She does state that most of her other meds come from [facility] mail order pharmacy but he is almost out of this one.

## 2022-09-07 NOTE — Telephone Encounter (Signed)
Is this okay to refill>? 

## 2022-09-08 ENCOUNTER — Other Ambulatory Visit: Payer: Self-pay | Admitting: Medical

## 2022-09-08 DIAGNOSIS — I130 Hypertensive heart and chronic kidney disease with heart failure and stage 1 through stage 4 chronic kidney disease, or unspecified chronic kidney disease: Secondary | ICD-10-CM | POA: Diagnosis not present

## 2022-09-08 DIAGNOSIS — D75839 Thrombocytosis, unspecified: Secondary | ICD-10-CM | POA: Diagnosis not present

## 2022-09-08 DIAGNOSIS — I428 Other cardiomyopathies: Secondary | ICD-10-CM | POA: Diagnosis not present

## 2022-09-08 DIAGNOSIS — F40231 Fear of injections and transfusions: Secondary | ICD-10-CM | POA: Diagnosis not present

## 2022-09-08 DIAGNOSIS — R32 Unspecified urinary incontinence: Secondary | ICD-10-CM | POA: Diagnosis not present

## 2022-09-08 DIAGNOSIS — I509 Heart failure, unspecified: Secondary | ICD-10-CM | POA: Diagnosis not present

## 2022-09-08 DIAGNOSIS — D45 Polycythemia vera: Secondary | ICD-10-CM | POA: Diagnosis not present

## 2022-09-08 DIAGNOSIS — N1831 Chronic kidney disease, stage 3a: Secondary | ICD-10-CM | POA: Diagnosis not present

## 2022-09-08 DIAGNOSIS — F0390 Unspecified dementia without behavioral disturbance: Secondary | ICD-10-CM | POA: Diagnosis not present

## 2022-09-08 MED ORDER — OLANZAPINE 5 MG PO TABS: 5.0000 mg | ORAL_TABLET | Freq: Every day | ORAL | 0 refills | Status: DC

## 2022-09-11 ENCOUNTER — Telehealth: Payer: Self-pay | Admitting: Internal Medicine

## 2022-09-11 ENCOUNTER — Other Ambulatory Visit: Payer: Self-pay | Admitting: Medical

## 2022-09-11 ENCOUNTER — Telehealth: Payer: Self-pay | Admitting: Medical

## 2022-09-11 DIAGNOSIS — I509 Heart failure, unspecified: Secondary | ICD-10-CM

## 2022-09-11 NOTE — Telephone Encounter (Signed)
Pt's daughter is calling and needs a referral to cardiology. She says when they were here the other day they would sent pt to cardiology. Please advise

## 2022-09-11 NOTE — Telephone Encounter (Signed)
I have also sent James Bryant peele a status update on pt to find out about [facility]

## 2022-09-11 NOTE — Telephone Encounter (Signed)
Mandeep daughter called and stated she got 70 vm and PT has been out to [facility] but not a home health aid. She says she feels like home health aid would be better for him then PT and she thought a referral was being put in for the home health aid. She says if anyone calls her and she can't answer, we can leave a vm on her phone.

## 2022-09-11 NOTE — Progress Notes (Signed)
refe

## 2022-09-11 NOTE — Telephone Encounter (Signed)
Left detailed message on pt's daughter vm

## 2022-09-12 ENCOUNTER — Telehealth: Payer: Self-pay | Admitting: Medical

## 2022-09-12 DIAGNOSIS — N1831 Chronic kidney disease, stage 3a: Secondary | ICD-10-CM | POA: Diagnosis not present

## 2022-09-12 DIAGNOSIS — F40231 Fear of injections and transfusions: Secondary | ICD-10-CM | POA: Diagnosis not present

## 2022-09-12 DIAGNOSIS — F0390 Unspecified dementia without behavioral disturbance: Secondary | ICD-10-CM | POA: Diagnosis not present

## 2022-09-12 DIAGNOSIS — I428 Other cardiomyopathies: Secondary | ICD-10-CM | POA: Diagnosis not present

## 2022-09-12 DIAGNOSIS — I509 Heart failure, unspecified: Secondary | ICD-10-CM | POA: Diagnosis not present

## 2022-09-12 DIAGNOSIS — I130 Hypertensive heart and chronic kidney disease with heart failure and stage 1 through stage 4 chronic kidney disease, or unspecified chronic kidney disease: Secondary | ICD-10-CM | POA: Diagnosis not present

## 2022-09-12 DIAGNOSIS — R32 Unspecified urinary incontinence: Secondary | ICD-10-CM | POA: Diagnosis not present

## 2022-09-12 DIAGNOSIS — D45 Polycythemia vera: Secondary | ICD-10-CM | POA: Diagnosis not present

## 2022-09-12 DIAGNOSIS — D75839 Thrombocytosis, unspecified: Secondary | ICD-10-CM | POA: Diagnosis not present

## 2022-09-12 NOTE — Telephone Encounter (Signed)
James Bryant called in from [facility] and stated their criteria has changed and if he doesn't have an active cancer diagnosis or need to be placed in nursing home within 6 months they wont be able to help him. Provided call back number [phone] extension/option 2

## 2022-09-12 NOTE — Telephone Encounter (Signed)
Per shane, looking like possibly 6 months or less to live. No cancer dx. They will go ahead and process this

## 2022-09-14 ENCOUNTER — Telehealth: Payer: Self-pay | Admitting: Medical

## 2022-09-14 NOTE — Telephone Encounter (Signed)
Wife called & states pt has 1 pill left of Olanzepine & pt needs this medication.  [name] he takes this for seeing things, monsters out his fingernails, etc & can't go without it.  I did urgent P.A.

## 2022-09-14 NOTE — Telephone Encounter (Signed)
P.A. approved til 08/21/23, daughter informed

## 2022-09-15 ENCOUNTER — Telehealth: Payer: Self-pay

## 2022-09-15 NOTE — Telephone Encounter (Signed)
0953- Palliative Care Nurse  RN called to schedule initial palliative care visit. No answer. Left voicemail with contact info and request to return call.  Jacqulyn Cane, RN

## 2022-09-20 DIAGNOSIS — F0390 Unspecified dementia without behavioral disturbance: Secondary | ICD-10-CM | POA: Diagnosis not present

## 2022-09-20 DIAGNOSIS — D75839 Thrombocytosis, unspecified: Secondary | ICD-10-CM | POA: Diagnosis not present

## 2022-09-20 DIAGNOSIS — F40231 Fear of injections and transfusions: Secondary | ICD-10-CM | POA: Diagnosis not present

## 2022-09-20 DIAGNOSIS — D45 Polycythemia vera: Secondary | ICD-10-CM | POA: Diagnosis not present

## 2022-09-20 DIAGNOSIS — I130 Hypertensive heart and chronic kidney disease with heart failure and stage 1 through stage 4 chronic kidney disease, or unspecified chronic kidney disease: Secondary | ICD-10-CM | POA: Diagnosis not present

## 2022-09-20 DIAGNOSIS — N1831 Chronic kidney disease, stage 3a: Secondary | ICD-10-CM | POA: Diagnosis not present

## 2022-09-20 DIAGNOSIS — I509 Heart failure, unspecified: Secondary | ICD-10-CM | POA: Diagnosis not present

## 2022-09-20 DIAGNOSIS — R32 Unspecified urinary incontinence: Secondary | ICD-10-CM | POA: Diagnosis not present

## 2022-09-20 DIAGNOSIS — I428 Other cardiomyopathies: Secondary | ICD-10-CM | POA: Diagnosis not present

## 2022-09-21 ENCOUNTER — Other Ambulatory Visit: Payer: Medicare HMO

## 2022-09-21 VITALS — BP 118/84 | HR 56 | Temp 97.7°F

## 2022-09-21 DIAGNOSIS — Z515 Encounter for palliative care: Secondary | ICD-10-CM

## 2022-09-21 NOTE — Progress Notes (Signed)
COMMUNITY PALLIATIVE CARE SW NOTE  PATIENT NAME: James Bryant DOB: 05/27/1946 MRN: [id number]  PRIMARY CARE PROVIDER: Carlena Hurl, PA-C  RESPONSIBLE PARTY:  Acct ID - Guarantor Home Phone Work Phone Relationship Acct Type  [id number] ARSALAN, [phone]  Self P/F     [address] [place],  [address]   Initial Palliative Social Work Visit  SW and Nurse-D. Georgann Housekeeper completed an initial visit with patient and his wife-James Bryant at his home. The family was provided education regarding palliative care services, role in care and visit frequency. His wife signed consent to services and she was given a packet for palliative care services and contact. His wife was also provided a dementia book as Scientist, physiological. Education was provided regarding patient's disease process and how this is impacted by his dementia. Extensive education was provided to patient's wife and daughter regarding hospice.   Patient sees his heart doctor on September 29, 2022. Patient had a fall on Tuesday morning out of the bed. He has had several falls without injury. Patient is currently receiving physical therapy through [facility] since January at 1-2 x/week. His appetite is fair. He is eating 2-3 meals a day, with a snack. Breakfast is patient's best meal. He naps during the day. He sleeps at night well, but gets up to the bathroom 2-3x a night. He is in bed around 8pm. 178lbs is patient's current weight as of 09/20/22. Patient has edema in both feet. His wife does elevate his feet. He does drool intermittently throughout the day. Patient ambulates with a rollator walker. He has difficulty grabbing objects, however his wife provide snacks that allow him to eat with his fingers.   SOCIAL HX: Patient from [place]. He has been married for 36 years. Patient has 10 children. He is retired Administrator. No military experience. Social History   Tobacco Use   Smoking status: Former   Smokeless tobacco: Never   Substance Use Topics   Alcohol use: No    Comment: stopped many years ago    CODE STATUS: DNR on file ADVANCED DIRECTIVES: No MOST FORM COMPLETE: No HOSPICE EDUCATION PROVIDED: Yes  Duration of visit and documentation: 60 minutes  [employer], LCSW

## 2022-09-22 ENCOUNTER — Other Ambulatory Visit: Payer: Medicare HMO

## 2022-09-22 DIAGNOSIS — Z515 Encounter for palliative care: Secondary | ICD-10-CM

## 2022-09-22 NOTE — Progress Notes (Signed)
COMMUNITY PALLIATIVE CARE SW NOTE  PATIENT NAME: James Bryant DOB: [birth year] MRN: [id number]  PRIMARY CARE PROVIDER: Carlena Hurl, PA-C  RESPONSIBLE PARTY:  Acct ID - Guarantor Home Phone Work Phone Relationship Acct Type  [id number] James, [name]  Self P/F     [address], [place], [place] [address]   Social Work Telephonic Encounter PC SW telephoned patient's daughter to advise that patient was not eligible for [facility] services as he is not a Estate agent recipient. SW suggested that she look into [facility] or the [facility] program, which she stated they have tried, but did not want to move forward with that program. SW offered to provide her with in-home care agencies that will be an out of pocket expense for the family. SW also encouraged her to reach out to patient's insurance provider to check if in-home benefits are apart of his plan.   SW provided supportive counseling, education and active listening as she discussed patient's decline, his lack of compliance with medical care and the family's fatigue and frustration as caregivers. SW validated her feelings, while providing reassurance of support to her as they try to understand and navigate his care.  SW briefly discussed the importance of advance directives and advised that she will review this further with she and her mother.  No other concerns noted.   Social History   Tobacco Use   Smoking status: Former   Smokeless tobacco: Never  Substance Use Topics   Alcohol use: No    Comment: stopped many years ago     [employer], LCSW

## 2022-09-22 NOTE — Progress Notes (Signed)
PATIENT NAME: ARACELI COUFAL DOB: 1946/01/26 MRN: [id number]  PRIMARY CARE PROVIDER: Carlena Hurl, PA-C  RESPONSIBLE PARTY:  Acct ID - Guarantor Home Phone Work Phone Relationship Acct Type  [id number] ALBI, [phone]  Self P/F     [address], [place], [place] [address]    Palliative Care Initial Encounter Note   Completed home visit with Katheren Puller, SW.   Wife Nida and daughter Jeral Fruit also present     HISTORY OF PRESENT ILLNESS:  77 y.o. male with memory loss, CHF, stage 3a CKD    Cognitive: memory loss; family reports pt has Alzheimer's; gave Dementia booklet; educated family on disease process; addressed questions/concerns; encouraged family to read booklet for more extensive discussion/education at next visit  Appetite: wife reports pt eats a fair amount, she leaves snack and fruit on the kitchen table so pt can pick what he wants to eat; she tries to provide finger foods d/t decrease in dexterity; he eats 2-3 meals and snacks  Cardiac: pt has an appt w/Cardiologist on 2.9.24; BLE edema noted   Mobility: wife reports pt fell OOB on 09/19/22; no injuries reported; has PT w/[facility] 1-2 times per week   Sleeping Pattern: gets up and down throughout the night d/t taking diuretics   Respiratory: no issues/concerns at this time  Pain: pt denies pain at this time  Visit info: pt's wt as of 09/20/22 is 178lbs; pt is awake and minimally interactive when we arrive but falls asleep shortly thereafter; when he awakens briefly, he begins to drool; his bottom lip is swollen and is drooping to his right side; daughter reports pt has fluid in his lip  Palliative Care/ Hospice: LPN explained role and purpose of palliative care including visit frequency. Also discussed benefits of hospice care as well as the differences between the two with patient.    Goals of Care: Wife and daughter want to keep the pt at home as long as possible with occasional respite  stays; family would like some in-home assistance, d/t caregiver fatigue, to keep him at home longer      CODE STATUS: DNR ADVANCED DIRECTIVES: N MOST FORM: No PPS: 40%   PHYSICAL EXAM:   VITALS: Today's Vitals   09/22/22 2008  BP: 118/84  Pulse: (!) 56  Temp: 97.7 F (36.5 C)  TempSrc: Temporal  SpO2: 97%  PainSc: 0-No pain    LUNGS:  diminished lung sounds CARDIAC: Cor RRR EXTREMITIES: edema noted to BLE SKIN:  BUE cool to touch, dry, intact   NEURO: negative except for memory problems       Hosea Han[place]alt Georgann Housekeeper, LPN

## 2022-09-23 ENCOUNTER — Emergency Department (HOSPITAL_COMMUNITY): Payer: Medicare HMO

## 2022-09-23 ENCOUNTER — Emergency Department (HOSPITAL_COMMUNITY)
Admission: EM | Admit: 2022-09-23 | Discharge: 2022-09-26 | Disposition: A | Discharge status: Home / Self Care | Payer: Medicare HMO | Attending: Emergency Medicine | Admitting: Emergency Medicine

## 2022-09-23 ENCOUNTER — Encounter (HOSPITAL_COMMUNITY): Payer: Self-pay

## 2022-09-23 DIAGNOSIS — S51012A Laceration without foreign body of left elbow, initial encounter: Secondary | ICD-10-CM | POA: Diagnosis not present

## 2022-09-23 DIAGNOSIS — F0280 Dementia in other diseases classified elsewhere without behavioral disturbance: Secondary | ICD-10-CM | POA: Insufficient documentation

## 2022-09-23 DIAGNOSIS — R059 Cough, unspecified: Secondary | ICD-10-CM | POA: Diagnosis not present

## 2022-09-23 DIAGNOSIS — Z23 Encounter for immunization: Secondary | ICD-10-CM | POA: Diagnosis not present

## 2022-09-23 DIAGNOSIS — R1310 Dysphagia, unspecified: Secondary | ICD-10-CM | POA: Diagnosis not present

## 2022-09-23 DIAGNOSIS — R531 Weakness: Secondary | ICD-10-CM | POA: Diagnosis not present

## 2022-09-23 DIAGNOSIS — Z751 Person awaiting admission to adequate facility elsewhere: Secondary | ICD-10-CM | POA: Diagnosis not present

## 2022-09-23 DIAGNOSIS — W01198A Fall on same level from slipping, tripping and stumbling with subsequent striking against other object, initial encounter: Secondary | ICD-10-CM | POA: Diagnosis not present

## 2022-09-23 DIAGNOSIS — R262 Difficulty in walking, not elsewhere classified: Secondary | ICD-10-CM | POA: Diagnosis not present

## 2022-09-23 DIAGNOSIS — S59902A Unspecified injury of left elbow, initial encounter: Secondary | ICD-10-CM | POA: Diagnosis not present

## 2022-09-23 DIAGNOSIS — I6782 Cerebral ischemia: Secondary | ICD-10-CM | POA: Diagnosis not present

## 2022-09-23 DIAGNOSIS — F02811 Dementia in other diseases classified elsewhere, unspecified severity, with agitation: Secondary | ICD-10-CM | POA: Diagnosis not present

## 2022-09-23 DIAGNOSIS — K7689 Other specified diseases of liver: Secondary | ICD-10-CM | POA: Diagnosis not present

## 2022-09-23 DIAGNOSIS — R4182 Altered mental status, unspecified: Secondary | ICD-10-CM | POA: Diagnosis not present

## 2022-09-23 DIAGNOSIS — S59909A Unspecified injury of unspecified elbow, initial encounter: Secondary | ICD-10-CM | POA: Diagnosis not present

## 2022-09-23 DIAGNOSIS — W19XXXA Unspecified fall, initial encounter: Secondary | ICD-10-CM

## 2022-09-23 DIAGNOSIS — R296 Repeated falls: Secondary | ICD-10-CM | POA: Diagnosis not present

## 2022-09-23 DIAGNOSIS — K8020 Calculus of gallbladder without cholecystitis without obstruction: Secondary | ICD-10-CM | POA: Diagnosis not present

## 2022-09-23 DIAGNOSIS — G309 Alzheimer's disease, unspecified: Secondary | ICD-10-CM | POA: Diagnosis not present

## 2022-09-23 DIAGNOSIS — R456 Violent behavior: Secondary | ICD-10-CM | POA: Diagnosis not present

## 2022-09-23 LAB — CBC
HCT: 41.3 % (ref 39.0–52.0)
Hemoglobin: 14 g/dL (ref 13.0–17.0)
MCH: 41.2 pg — ABNORMAL HIGH (ref 26.0–34.0)
MCHC: 33.9 g/dL (ref 30.0–36.0)
MCV: 121.5 fL — ABNORMAL HIGH (ref 80.0–100.0)
Platelets: 167 10*3/uL (ref 150–400)
RBC: 3.4 MIL/uL — ABNORMAL LOW (ref 4.22–5.81)
RDW: 17.5 % — ABNORMAL HIGH (ref 11.5–15.5)
WBC: 3.6 10*3/uL — ABNORMAL LOW (ref 4.0–10.5)
nRBC: 0 % (ref 0.0–0.2)

## 2022-09-23 LAB — COMPREHENSIVE METABOLIC PANEL
ALT: 17 U/L (ref 0–44)
AST: 42 U/L — ABNORMAL HIGH (ref 15–41)
Albumin: 2.9 g/dL — ABNORMAL LOW (ref 3.5–5.0)
Alkaline Phosphatase: 125 U/L (ref 38–126)
Anion gap: 12 (ref 5–15)
BUN: 18 mg/dL (ref 8–23)
CO2: 26 mmol/L (ref 22–32)
Calcium: 9.1 mg/dL (ref 8.9–10.3)
Chloride: 98 mmol/L (ref 98–111)
Creatinine, Ser: 1.64 mg/dL — ABNORMAL HIGH (ref 0.61–1.24)
GFR, Estimated: 43 mL/min — ABNORMAL LOW (ref >60)
Glucose, Bld: 125 mg/dL — ABNORMAL HIGH (ref 70–99)
Potassium: 3.7 mmol/L (ref 3.5–5.1)
Sodium: 136 mmol/L (ref 135–145)
Total Bilirubin: 4.4 mg/dL — ABNORMAL HIGH (ref 0.3–1.2)
Total Protein: 7.2 g/dL (ref 6.5–8.1)

## 2022-09-23 LAB — URINALYSIS, ROUTINE W REFLEX MICROSCOPIC
Bilirubin Urine: NEGATIVE
Glucose, UA: NEGATIVE mg/dL
Hgb urine dipstick: NEGATIVE
Ketones, ur: NEGATIVE mg/dL
Leukocytes,Ua: NEGATIVE
Nitrite: NEGATIVE
Protein, ur: NEGATIVE mg/dL
Specific Gravity, Urine: 1.009 (ref 1.005–1.030)
pH: 6 (ref 5.0–8.0)

## 2022-09-23 LAB — CBG MONITORING, ED: Glucose-Capillary: 107 mg/dL — ABNORMAL HIGH (ref 70–99)

## 2022-09-23 MED ORDER — POTASSIUM CHLORIDE ER 10 MEQ PO TBCR
10.0000 meq | EXTENDED_RELEASE_TABLET | Freq: Two times a day (BID) | ORAL | Status: DC
  Administered 2022-09-24 – 2022-09-25 (×3): 10 meq via ORAL
  Filled 2022-09-23 (×10): qty 1

## 2022-09-23 MED ORDER — IRBESARTAN 300 MG PO TABS
150.0000 mg | ORAL_TABLET | Freq: Every day | ORAL | Status: DC
  Administered 2022-09-25: 150 mg via ORAL
  Filled 2022-09-23 (×2): qty 1

## 2022-09-23 MED ORDER — LORAZEPAM 2 MG/ML IJ SOLN
2.0000 mg | Freq: Once | INTRAMUSCULAR | Status: AC
  Administered 2022-09-23: 2 mg via INTRAMUSCULAR

## 2022-09-23 MED ORDER — ALBUTEROL SULFATE (2.5 MG/3ML) 0.083% IN NEBU: 2.5000 mg | INHALATION_SOLUTION | Freq: Four times a day (QID) | RESPIRATORY_TRACT | Status: DC | PRN

## 2022-09-23 MED ORDER — LORAZEPAM 2 MG/ML IJ SOLN
2.0000 mg | INTRAMUSCULAR | Status: DC
  Filled 2022-09-23: qty 1

## 2022-09-23 MED ORDER — OLANZAPINE 10 MG PO TABS
5.0000 mg | ORAL_TABLET | Freq: Every day | ORAL | Status: DC
  Administered 2022-09-24 – 2022-09-25 (×2): 5 mg via ORAL
  Filled 2022-09-23 (×3): qty 1

## 2022-09-23 MED ORDER — HALOPERIDOL LACTATE 5 MG/ML IJ SOLN
5.0000 mg | Freq: Once | INTRAMUSCULAR | Status: AC
  Administered 2022-09-23: 5 mg via INTRAMUSCULAR
  Filled 2022-09-23: qty 1

## 2022-09-23 MED ORDER — ASPIRIN 81 MG PO TBEC
81.0000 mg | DELAYED_RELEASE_TABLET | Freq: Every day | ORAL | Status: DC
  Administered 2022-09-25: 81 mg via ORAL
  Filled 2022-09-23 (×2): qty 1

## 2022-09-23 MED ORDER — LORAZEPAM 0.5 MG PO TABS
0.5000 mg | ORAL_TABLET | Freq: Two times a day (BID) | ORAL | Status: DC
  Administered 2022-09-24 – 2022-09-25 (×3): 0.5 mg via ORAL
  Filled 2022-09-23 (×3): qty 1

## 2022-09-23 MED ORDER — CARVEDILOL 3.125 MG PO TABS
6.2500 mg | ORAL_TABLET | Freq: Two times a day (BID) | ORAL | Status: DC
  Administered 2022-09-24 – 2022-09-25 (×4): 6.25 mg via ORAL
  Filled 2022-09-23 (×5): qty 2

## 2022-09-23 MED ORDER — HALOPERIDOL LACTATE 5 MG/ML IJ SOLN
2.5000 mg | INTRAMUSCULAR | Status: AC
  Administered 2022-09-23: 2.5 mg via INTRAVENOUS
  Filled 2022-09-23: qty 1

## 2022-09-23 MED ORDER — TETANUS-DIPHTH-ACELL PERTUSSIS 5-2.5-18.5 LF-MCG/0.5 IM SUSY
0.5000 mL | PREFILLED_SYRINGE | Freq: Once | INTRAMUSCULAR | Status: AC
  Administered 2022-09-23: 0.5 mL via INTRAMUSCULAR
  Filled 2022-09-23: qty 0.5

## 2022-09-23 MED ORDER — HYDROXYUREA 500 MG PO CAPS
500.0000 mg | ORAL_CAPSULE | Freq: Two times a day (BID) | ORAL | Status: DC
  Administered 2022-09-24 – 2022-09-25 (×3): 500 mg via ORAL
  Filled 2022-09-23 (×6): qty 1

## 2022-09-23 MED ORDER — DIPHENHYDRAMINE HCL 50 MG/ML IJ SOLN
25.0000 mg | Freq: Once | INTRAMUSCULAR | Status: AC
  Administered 2022-09-23: 25 mg via INTRAMUSCULAR
  Filled 2022-09-23: qty 1

## 2022-09-23 MED ORDER — DONEPEZIL HCL 10 MG PO TABS
5.0000 mg | ORAL_TABLET | Freq: Every day | ORAL | Status: DC
  Administered 2022-09-24 – 2022-09-25 (×2): 5 mg via ORAL
  Filled 2022-09-23 (×3): qty 1

## 2022-09-23 MED ORDER — FUROSEMIDE 20 MG PO TABS
80.0000 mg | ORAL_TABLET | Freq: Two times a day (BID) | ORAL | Status: DC
  Administered 2022-09-24 – 2022-09-25 (×3): 80 mg via ORAL
  Filled 2022-09-23 (×5): qty 4

## 2022-09-23 MED ORDER — FERROUS GLUCONATE 324 (38 FE) MG PO TABS
324.0000 mg | ORAL_TABLET | Freq: Every day | ORAL | Status: DC
  Administered 2022-09-25: 324 mg via ORAL
  Filled 2022-09-23 (×3): qty 1

## 2022-09-23 NOTE — ED Notes (Signed)
Per CT, pt was unable to tolerate scan and they were unable to obtain imaging. MD notified.
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
## 2022-09-23 NOTE — ED Notes (Signed)
Patient is calm, cooperative and resting in bed at this time.

## 2022-09-23 NOTE — ED Notes (Signed)
Patient returned from [place]. Patient uncooperative in CT would not lay down on the table, kept sitting up. EDP notified.

## 2022-09-23 NOTE — ED Notes (Signed)
Patient is still very drowsy from his medications earlier. Daughter is at beside and requested [place] not try to give them to him as he is very sleepy and concerned he would not swallow the pills.
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
## 2022-09-24 ENCOUNTER — Emergency Department (HOSPITAL_COMMUNITY): Payer: Medicare HMO

## 2022-09-24 DIAGNOSIS — R1310 Dysphagia, unspecified: Secondary | ICD-10-CM | POA: Diagnosis not present

## 2022-09-24 DIAGNOSIS — F02811 Dementia in other diseases classified elsewhere, unspecified severity, with agitation: Secondary | ICD-10-CM | POA: Diagnosis not present

## 2022-09-24 DIAGNOSIS — R059 Cough, unspecified: Secondary | ICD-10-CM | POA: Diagnosis not present

## 2022-09-24 DIAGNOSIS — R296 Repeated falls: Secondary | ICD-10-CM | POA: Diagnosis not present

## 2022-09-24 DIAGNOSIS — S59902A Unspecified injury of left elbow, initial encounter: Secondary | ICD-10-CM | POA: Diagnosis not present

## 2022-09-24 DIAGNOSIS — R456 Violent behavior: Secondary | ICD-10-CM | POA: Diagnosis not present

## 2022-09-24 DIAGNOSIS — R262 Difficulty in walking, not elsewhere classified: Secondary | ICD-10-CM | POA: Diagnosis not present

## 2022-09-24 DIAGNOSIS — S51012A Laceration without foreign body of left elbow, initial encounter: Secondary | ICD-10-CM | POA: Diagnosis not present

## 2022-09-24 MED ORDER — ALBUTEROL SULFATE HFA 108 (90 BASE) MCG/ACT IN AERS: 2.0000 | INHALATION_SPRAY | Freq: Four times a day (QID) | RESPIRATORY_TRACT | Status: DC | PRN

## 2022-09-24 NOTE — ED Notes (Signed)
Applied warm blankets to pt hand

## 2022-09-24 NOTE — ED Notes (Signed)
Patient resting in bed. With daughter sleeping at bedside.

## 2022-09-24 NOTE — NC FL2 (Addendum)
[place] LEVEL OF CARE FORM     IDENTIFICATION  Patient Name: James Bryant Birthdate: 04-Jul-1946 Sex: male Admission Date (Current Location): 09/23/2022  [facility] [place] and [place] Number:  Herbalist and Address:  [facility] [place], [place] [address], [place], [place] [address]      Provider Number: [id number]  Attending Physician Name and Address:  Default, Provider, MD  Relative Name and Phone Number:       Current Level of Care: Hospital Recommended Level of Care: [place] Prior Approval Number:    Date Approved/Denied:   PASRR Number:  [id number]  Discharge Plan: SNF    Current Diagnoses: Patient Active Problem List   Diagnosis Date Noted   Memory loss 05/31/2022   Balance problem 05/31/2022   Coordination abnormal 05/31/2022   Urinary incontinence 04/06/2022   Dependence on cane 12/26/2021   Primary hypertension 12/20/2021   Alkaline phosphatase elevation 04/08/2021   Congestive heart failure, NYHA class 2 ([place]) 04/08/2021   Iron deficiency 04/08/2021   Edema 03/23/2021   Decreased activities of daily living (ADL) 03/23/2021   Mobility impaired 03/23/2021   Stage 3a chronic kidney disease ([place]) 02/28/2021   Vitamin D deficiency 01/07/2020   Polycythemia vera ([place]) 02/26/2019   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   Elevated liver function tests 01/17/2019   Thrombocytosis 01/17/2019   Former smoker, stopped smoking many years ago 01/17/2019   Personal history of noncompliance with medical treatment, presenting hazards to health 01/09/2019    Orientation RESPIRATION BLADDER Height & Weight     Self, Time, Situation, Place  Normal Continent Weight: 185 lb 3 oz (84 kg) Height:  '5\' 7"'$  (170.2 cm)  BEHAVIORAL SYMPTOMS/MOOD NEUROLOGICAL BOWEL NUTRITION STATUS      Continent Diet  AMBULATORY STATUS COMMUNICATION OF NEEDS Skin   Extensive Assist Verbally Normal                       Personal Care  Assistance Level of Assistance  Bathing, Feeding, Dressing Bathing Assistance: Limited assistance Feeding assistance: Limited assistance Dressing Assistance: Limited assistance     Functional Limitations Info  Sight, Hearing, Speech Sight Info: Adequate Hearing Info: Adequate Speech Info: Adequate    SPECIAL CARE FACTORS FREQUENCY  PT (By licensed PT), OT (By licensed OT)     PT Frequency: 5x weekly OT Frequency: 5x weekly            Contractures Contractures Info: Not present    Additional Factors Info  Code Status, Allergies Code Status Info: Full Code Allergies Info: No known allergies           Current Medications (09/24/2022):  This is the current hospital active medication list Current Facility-Administered Medications  Medication Dose Route Frequency Provider Last Rate Last Admin   albuterol (PROVENTIL) (2.5 MG/3ML) 0.083% nebulizer solution 2.5 mg  2.5 mg Nebulization Q6H PRN Fransico Meadow, MD       aspirin EC tablet 81 mg  81 mg Oral Daily Fransico Meadow, MD       carvedilol (COREG) tablet 6.25 mg  6.25 mg Oral BID WC Fransico Meadow, MD   6.25 mg at 09/24/22 0804   donepezil (ARICEPT) tablet 5 mg  5 mg Oral QHS Fransico Meadow, MD       ferrous gluconate [facility]) tablet 324 mg  324 mg Oral Q breakfast Fransico Meadow, MD       furosemide (LASIX) tablet  80 mg  80 mg Oral BID Fransico Meadow, MD       hydroxyurea (HYDREA) capsule 500 mg  500 mg Oral BID Fransico Meadow, MD       irbesartan (AVAPRO) tablet 150 mg  150 mg Oral Daily Fransico Meadow, MD       LORazepam (ATIVAN) tablet 0.5 mg  0.5 mg Oral BID Fransico Meadow, MD       OLANZapine [facility]) tablet 5 mg  5 mg Oral QHS Fransico Meadow, MD       potassium chloride (KLOR-CON) CR tablet 10 mEq  10 mEq Oral BID Fransico Meadow, MD       Current Outpatient Medications  Medication Sig Dispense Refill   albuterol (PROVENTIL) (2.5 MG/3ML) 0.083% nebulizer solution Take 3  mLs (2.5 mg total) by nebulization every 6 (six) hours as needed for wheezing or shortness of breath. 150 mL 1   ASPIRIN LOW DOSE 81 MG tablet TAKE 1 TABLET (81 MG TOTAL) BY MOUTH DAILY. (Patient taking differently: Take 81 mg by mouth daily.) 90 tablet 1   brimonidine (ALPHAGAN) 0.2 % ophthalmic solution SMARTSIG:In Eye(s)     carvedilol (COREG) 6.25 MG tablet TAKE 1 TABLET TWICE DAILY (NEED MD APPOINTMENT) (Patient taking differently: Take 6.25 mg by mouth 2 (two) times daily with a meal.) 180 tablet 3   donepezil (ARICEPT) 5 MG tablet Take 1 tablet (5 mg total) by mouth at bedtime. 30 tablet 6   furosemide (LASIX) 80 MG tablet Take 1 tablet (80 mg total) by mouth 2 (two) times daily. 60 tablet 0   hydroxyurea (HYDREA) 500 MG capsule TAKE 1 CAPSULE (500 MG TOTAL) BY MOUTH 2 (TWO) TIMES DAILY. MAY TAKE WITH FOOD TO MINIMIZE GI SIDE EFFECTS. 180 capsule 3   LORazepam (ATIVAN) 0.5 MG tablet Take 1 tablet (0.5 mg total) by mouth 2 (two) times daily. (Patient taking differently: Take 0.5 mg by mouth every evening.) 5 tablet 0   Multiple Vitamins-Minerals (MEGA MULTIVITAMIN FOR MEN PO) Take 1 tablet by mouth daily.     nystatin cream (MYCOSTATIN) Apply 1 Application topically as needed.     OLANZapine (ZYPREXA) 5 MG tablet Take 1 tablet (5 mg total) by mouth at bedtime. Take one tablet nightly at 7 pm 90 tablet 0   potassium chloride (KLOR-CON) 10 MEQ tablet Take 1 tablet (10 mEq total) by mouth 2 (two) times daily. 60 tablet 0   valsartan (DIOVAN) 160 MG tablet Take 1 tablet (160 mg total) by mouth daily. 90 tablet 0   ferrous gluconate (FERGON) 324 MG tablet TAKE 1 TABLET EVERY DAY WITH BREAKFAST (Patient taking differently: Take 324 mg by mouth daily with breakfast.) 90 tablet 0     Discharge Medications: Please see discharge summary for a list of discharge medications.  Relevant Imaging Results:  Relevant Lab Results:   Additional Information SSN: [id number]  Archie Endo,  LCSW

## 2022-09-24 NOTE — ED Notes (Signed)
Pt's brief and gown changed. Pt resting after being redirected to place and situation.

## 2022-09-24 NOTE — Progress Notes (Signed)
CSW completed FL2 and faxed patient's clinicals out for review.  Madilyn Fireman, MSW, LCSW Transitions of Care  Clinical Social Worker II [phone]

## 2022-09-24 NOTE — Evaluation (Addendum)
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 No pertinent surgical history. HPI:  Pt is a 77 year old male who presented with left elbow trauma after fall. CT head negative. PMH: Alzheimer's dementia.    Assessment / Plan / Recommendation  Clinical Impression  Pt was seen for bedside swallow evaluation with his wife and daughter present. Pt's daughter reported that the pt coughs when eating, but that this is not observed when he drinks. She stated that the pt has been symptomatic for approximately two months and that he has also been demonstrating a wet vocal quality during this time. Pt was somnolent upon SLP's arrival, but alertness was optimized with seating pt EOB with assistance from RN and pt's family. Pt was somewhat agitated with this level of stimulation, but participated in the evaluation with encouragement and redirection. A wet vocal quality was noted at baseline, suggesting reduced secretion management. He presented with symptoms of oropharyngeal dysphagia characterized by reduced bolus awareness, mild oral holding, prolonged mastication, oral residue, and a delayed cough with dysphagia 3 boluses. A dysphagia 1 diet with thin liquids is recommended at this time. A modified barium swallow study is clinically indicated at this time, but pt's current mentation does not support the completion of this today. SLP will follow pt to assess  tolerance of the current diet and for possible instrumental assessment. SLP Visit Diagnosis: Dysphagia, unspecified (R13.10)    Aspiration Risk  Mild aspiration risk;Moderate aspiration risk    Diet Recommendation Dysphagia 1 (Puree);Thin liquid   Liquid Administration via: Cup;Straw Medication Administration: Crushed with puree (or whole with puree; as tolerated) Supervision: Full supervision/cueing for compensatory strategies Compensations: Slow rate;Small sips/bites Postural Changes: Seated upright at 90 degrees    Other  Recommendations Oral Care Recommendations: Oral care BID    Recommendations for follow up therapy are one component of a multi-disciplinary discharge planning process, led by the attending physician.  Recommendations may be updated based on patient status, additional functional criteria and insurance authorization.  Follow up Recommendations        Assistance Recommended at Discharge    Functional Status Assessment Patient has had a recent decline in their functional status and demonstrates the ability to make significant improvements in function in a reasonable and predictable amount of time.  Frequency and Duration min 2x/week  2 weeks       Prognosis Prognosis for Safe Diet Advancement: Fair Barriers to Reach Goals: Cognitive deficits;Time post onset;Severity of deficits      Swallow Study   General Date of Onset: 07/24/22 HPI: Pt is a 77 year old male who presented with left elbow trauma after fall. CT head negative. PMH: Alzheimer's dementia. Type of Study: Bedside Swallow Evaluation Previous Swallow Assessment: none Diet Prior to this Study: Regular;Thin liquids Temperature Spikes Noted: No Respiratory Status: Room air History of Recent Intubation: No Behavior/Cognition: Alert;Cooperative;Pleasant mood;Requires cueing Oral Cavity Assessment: Within Functional Limits Oral Care Completed by SLP: No Oral Cavity - Dentition:  Edentulous  Self-Feeding Abilities: Total assist Patient Positioning: Upright in bed;Postural control adequate for testing (seated EOB with assistance from family, RN, SLP) Baseline Vocal Quality: Wet Volitional Cough: Cognitively unable to elicit Volitional Swallow: Unable to elicit    Oral/Motor/Sensory Function Overall Oral Motor/Sensory Function:  (Unable to assess)   Ice Chips Ice chips: Impaired Presentation: Spoon Oral Phase Impairments: Poor awareness of bolus   Thin Liquid Thin Liquid: Impaired Presentation: Cup;Straw Oral Phase Impairments: Poor awareness of bolus Oral Phase Functional Implications: Prolonged oral transit Pharyngeal  Phase Impairments: Wet Vocal Quality (but not worse than at baseline)    Nectar Thick Nectar Thick Liquid: Not tested   Honey Thick Honey Thick Liquid: Not tested   Puree Puree: Impaired Presentation: Spoon Oral Phase Impairments: Poor awareness of bolus Oral Phase Functional Implications: Prolonged oral transit;Oral residue   Solid     Solid: Impaired Oral Phase Impairments: Impaired mastication Oral Phase Functional Implications: Impaired mastication;Oral residue Pharyngeal Phase Impairments: Cough - Delayed     Hisao Doo I. Hardin Negus, [place], [place] Office number [phone]  Horton Marshall 09/24/2022,11:55 AM

## 2022-09-24 NOTE — ED Notes (Signed)
RN assisted daughter at bedside with changing linens, gown, and brief. Daughter provided pericare and applied compression socks.

## 2022-09-24 NOTE — ED Notes (Signed)
Patient resting in bed with Daughter at bedside.

## 2022-09-24 NOTE — ED Notes (Signed)
RN administered two Coreg pills with water. Once pt was done taking medication he had an audible wet breathing sound. RN placed SLP evaluation

## 2022-09-24 NOTE — ED Provider Notes (Addendum)
Emergency Medicine Observation Re-evaluation Note  James Bryant is a 77 y.o. male with a history of Alzheimer's dementia, seen on rounds today.  Pt initially presented to the ED for complaints of frequent falls  Currently, the patient does not have any complaints.  Denies shortness of breath.  Physical Exam  BP (!) 144/87   Pulse 75   Temp 97.6 F (36.4 C) (Oral)   Resp 12   Ht '5\' 7"'$  (1.702 m)   Wt 84 kg   SpO2 92%   BMI 29.00 kg/m  Physical Exam General: Sitting on the edge of stretcher alert Lungs: CTAB, normal work of breathing Psych: Calm  ED Course / MDM  EKG:   I have reviewed the labs performed to date as well as medications administered while in observation.  Recent changes in the last 24 hours include presented to the hospital had negative workup for altered mental status and trauma and feel that his symptoms are likely due to his dementia.  Nursing placed SLP evaluation since he may have had an episode of dysphagia.  No worsening shortness of breath.  Plan  Current plan is for placement.  See below for additional.  #Dysphagia - SLP evaluation - Nursing reporting occasional cough will get CXR  #Frequent falls and deconditioning -  PT recommending skilled nursing short-term rehab   Fransico Meadow, MD 09/24/22 1135    Fransico Meadow, MD 09/24/22 1201

## 2022-09-24 NOTE — ED Notes (Signed)
PT at bedside.

## 2022-09-24 NOTE — Evaluation (Signed)
Physical Therapy Evaluation Patient Details Name: James Bryant MRN: [id number] DOB: 02-07-1946 Today's Date: 09/24/2022  History of Present Illness  Pt is a 77 y.o. male who presented to the ED 2/3 following a fall at home. Pt sustained skin tear to L elbow. L elbow xray negative. Head CT negative. Pt with recent increased agressive behavior at home and multiple falls. PMH: Alzheimer's dementia, LE edema   Clinical Impression  Pt admitted with above diagnosis. PTA pt lived at home with spouse, amb with rollator. Pt has had steady decline over last several months, requiring increased assist, having frequent falls, and demonstrating increased aggressive behavior. On eval, pt required +2 max assist bed mobility, and mod/max assist sit to stand with rollator. Posterior lean noted in sitting and standing. Pt presents with deficits in cognition, strength, balance, and activity tolerance. Pt will benefit from skilled PT to increase their independence and safety with mobility to allow discharge to the venue listed below.          Recommendations for follow up therapy are one component of a multi-disciplinary discharge planning process, led by the attending physician.  Recommendations may be updated based on patient status, additional functional criteria and insurance authorization.  Follow Up Recommendations Skilled nursing-short term rehab (<3 hours/day) Can patient physically be transported by private vehicle: No    Assistance Recommended at Discharge Frequent or constant Supervision/Assistance  Patient can return home with the following  Two people to help with walking and/or transfers;Two people to help with bathing/dressing/bathroom;Help with stairs or ramp for entrance;Assist for transportation;Direct supervision/assist for medications management;Assistance with cooking/housework;Assistance with feeding    Equipment Recommendations Wheelchair (measurements PT);Wheelchair cushion (measurements  PT);Hospital bed  Recommendations for Other Services       Functional Status Assessment Patient has had a recent decline in their functional status and/or demonstrates limited ability to make significant improvements in function in a reasonable and predictable amount of time     Precautions / Restrictions Precautions Precautions: Fall      Mobility  Bed Mobility Overal bed mobility: Needs Assistance Bed Mobility: Supine to Sit, Sit to Supine     Supine to sit: +2 for physical assistance, HOB elevated, Max assist Sit to supine: Max assist, +2 for physical assistance   General bed mobility comments: assist for all aspects of mobility, use of bed pad to scoot to EOB    Transfers Overall transfer level: Needs assistance Equipment used: Rollator (4 wheels) Transfers: Sit to/from Stand Sit to Stand: Max assist, Mod assist           General transfer comment: STS x 2 trials from stretcher. Good initiation. Mod assist to power up and max assist to maintain stance. Pt maintaining posterior lean with weight in heels. Only sustaining stance a few seconds.    Ambulation/Gait               General Gait Details: unable to safely progress  Stairs            Wheelchair Mobility    Modified Rankin (Stroke Patients Only)       Balance Overall balance assessment: Needs assistance Sitting-balance support: Feet supported, No upper extremity supported Sitting balance-Leahy Scale: Poor   Postural control: Posterior lean Standing balance support: Bilateral upper extremity supported, During functional activity, Reliant on assistive device for balance Standing balance-Leahy Scale: Zero  Pertinent Vitals/Pain Pain Assessment Pain Assessment: Faces Faces Pain Scale: No hurt    Home Living Family/patient expects to be discharged to:: Private residence Living Arrangements: Spouse/significant other Available Help at Discharge:  Family;Available 24 hours/day Type of Home: House Home Access: Stairs to enter Entrance Stairs-Rails: None Entrance Stairs-Number of Steps: 3   Home Layout: One level Home Equipment: Conservation officer, nature (2 wheels);Rollator (4 wheels) Additional Comments: Active with HHPT at time of admission    Prior Function Prior Level of Function : Needs assist;History of Falls (last six months)  Cognitive Assist : Mobility (cognitive);ADLs (cognitive)     Physical Assist : Mobility (physical);ADLs (physical)     Mobility Comments: Pt amb household distances with rollator. Multi recent falls. ADLs Comments: Wife assists with ADLs as needed. Primarily sponge bathes.     Hand Dominance        Extremity/Trunk Assessment   Upper Extremity Assessment Upper Extremity Assessment: Generalized weakness;LUE deficits/detail LUE Deficits / Details: dressing to skin tear L elbow    Lower Extremity Assessment Lower Extremity Assessment: Generalized weakness    Cervical / Trunk Assessment Cervical / Trunk Assessment: Kyphotic  Communication   Communication: Expressive difficulties  Cognition Arousal/Alertness: Lethargic, Suspect due to medications (meds given for CT yesterday evening) Behavior During Therapy: Flat affect Overall Cognitive Status: Difficult to assess                                 General Comments: Alzheimer's dementia at baseline. Groggy. Very limited verbalizations. Calm and cooperative. No eye contact. Following simple commands 50% of trials.        General Comments General comments (skin integrity, edema, etc.): VSS on RA    Exercises     Assessment/Plan    PT Assessment Patient needs continued PT services  PT Problem List Decreased strength;Decreased balance;Decreased cognition;Decreased knowledge of precautions;Decreased mobility;Decreased activity tolerance;Decreased safety awareness       PT Treatment Interventions DME instruction;Functional mobility  training;Balance training;Patient/family education;Gait training;Therapeutic activities;Therapeutic exercise    PT Goals (Current goals can be found in the Care Plan section)  Acute Rehab PT Goals Patient Stated Goal: rehab per daughter to get stronger PT Goal Formulation: With patient/family Time For Goal Achievement: 10/08/22 Potential to Achieve Goals: Fair    Frequency Min 2X/week     Co-evaluation               AM-PAC PT "6 Clicks" Mobility  Outcome Measure Help needed turning from your back to your side while in a flat bed without using bedrails?: A Lot Help needed moving from lying on your back to sitting on the side of a flat bed without using bedrails?: A Lot Help needed moving to and from a bed to a chair (including a wheelchair)?: Total Help needed standing up from a chair using your arms (e.g., wheelchair or bedside chair)?: A Lot Help needed to walk in hospital room?: Total Help needed climbing 3-5 steps with a railing? : Total 6 Click Score: 9    End of Session Equipment Utilized During Treatment: Gait belt Activity Tolerance: Patient tolerated treatment well Patient left: in bed;with call bell/phone within reach;with family/visitor present Nurse Communication: Mobility status PT Visit Diagnosis: Other abnormalities of gait and mobility (R26.89);Muscle weakness (generalized) (M62.81);Repeated falls (R29.6)    Time: 1884-1660 PT Time Calculation (min) (ACUTE ONLY): 24 min   Charges:   PT Evaluation $PT Eval Moderate Complexity: 1 Mod PT  Treatments $Therapeutic Activity: 8-22 mins        Gloriann Loan., PT  Office # [phone]   Lorriane Shire 09/24/2022, 8:45 AM

## 2022-09-25 ENCOUNTER — Telehealth: Payer: Self-pay

## 2022-09-25 ENCOUNTER — Emergency Department (HOSPITAL_COMMUNITY): Payer: Medicare HMO

## 2022-09-25 DIAGNOSIS — K7689 Other specified diseases of liver: Secondary | ICD-10-CM | POA: Diagnosis not present

## 2022-09-25 DIAGNOSIS — K8020 Calculus of gallbladder without cholecystitis without obstruction: Secondary | ICD-10-CM | POA: Diagnosis not present

## 2022-09-25 LAB — COMPREHENSIVE METABOLIC PANEL
ALT: 21 U/L (ref 0–44)
AST: 51 U/L — ABNORMAL HIGH (ref 15–41)
Albumin: 2.8 g/dL — ABNORMAL LOW (ref 3.5–5.0)
Alkaline Phosphatase: 107 U/L (ref 38–126)
Anion gap: 11 (ref 5–15)
BUN: 16 mg/dL (ref 8–23)
CO2: 29 mmol/L (ref 22–32)
Calcium: 8.9 mg/dL (ref 8.9–10.3)
Chloride: 100 mmol/L (ref 98–111)
Creatinine, Ser: 1.5 mg/dL — ABNORMAL HIGH (ref 0.61–1.24)
GFR, Estimated: 47 mL/min — ABNORMAL LOW (ref >60)
Glucose, Bld: 86 mg/dL (ref 70–99)
Potassium: 3.4 mmol/L — ABNORMAL LOW (ref 3.5–5.1)
Sodium: 140 mmol/L (ref 135–145)
Total Bilirubin: 5.2 mg/dL — ABNORMAL HIGH (ref 0.3–1.2)
Total Protein: 6.6 g/dL (ref 6.5–8.1)

## 2022-09-25 LAB — PROTIME-INR
INR: 1.4 — ABNORMAL HIGH (ref 0.8–1.2)
Prothrombin Time: 17.2 seconds — ABNORMAL HIGH (ref 11.4–15.2)

## 2022-09-25 NOTE — Progress Notes (Addendum)
Daughter has selected [facility]. TOC to begin Auth. Attempted to contact Jeral Fruit [phone], left voicemail.  Addend @ 12:56 PM CSW contacted Sharlett Iles who informed the pt started Outpatient Palliative she believes last Friday. States she thinks her mother contacted [facility] to inform them that he was in the ED. States the Education officer, museum they met is [place].  Addend @ 2:45 PM Auth is still pending.

## 2022-09-25 NOTE — ED Notes (Signed)
Ultrasound at bedside

## 2022-09-25 NOTE — ED Notes (Signed)
Pt placed in 2L  because of desaturation while sleeping

## 2022-09-25 NOTE — Progress Notes (Signed)
CSW spoke with patients daughter James Bryant [phone] and presented the two bed offers to [facility] health care and [facility]. Patients daughter stated she wanted to go look at the facilities before she makes a decision. CSW told patients daughter that she would need a decision today so the insurance authorization can be started. Patients daughter stated she would call back.

## 2022-09-25 NOTE — Progress Notes (Signed)
TOC CSW received pts auth # from [place].  CSW contacted Janie @ [facility] to make aware of the approved auth.  Candace Cruise:  [id number]  Atasha Colebank Tarpley-Carter, MSW, LCSW-A Pronouns:  She/Her/Hers [facility] Clinical Social Worker Direct Number:  [phone] [email or web address]

## 2022-09-25 NOTE — ED Notes (Signed)
Pt tossing legs over the side of stretcher placed back in bed. Warm blankets given. Social work at bedside.

## 2022-09-25 NOTE — Progress Notes (Signed)
CSW spoke with admissions director Kia with [place] with [facility] to let them know that CSW presented the bed offers and the daughter Sharlett Iles [phone]  plans on coming to their facility to take a tour before she makes a decision. Narda Rutherford and Kia stated they would be on the look out for patients daughter today.

## 2022-09-25 NOTE — Progress Notes (Signed)
Speech Language Pathology Treatment: Dysphagia  Patient Details Name: James Bryant MRN: [id number] DOB: [birth year] Today's Date: 09/25/2022 Time: 0175-1025 SLP Time Calculation (min) (ACUTE ONLY): 8 min  Assessment / Plan / Recommendation Clinical Impression  Pt is somewhat drowsy this am but able to maintain alertness for po's. He is cooperative and not agitated today as he was for yesterday's initial assessment and baseline vocal quality was clear and not wet. RN reported he took pills whole this am without difficulty or pocketing with applesauce. There was no coughing following applesauce trials or straw sips water. He did have delayed throat clear on several occasions but appeared to maintain airway protection deemed from informal measures. Upgraded texture not attempted due to his lethargy at present. Recommend he continue a Dys 1 (puree), thin liquids, eat/drink when adequately alert and no further ST needed in the acute venue. He has SNF bed offers and recommend SLP reasses pt for advancement of textures when appropriate.   HPI HPI: Pt is a 77 year old male who presented with left elbow trauma after fall. CT head negative. PMH: Alzheimer's dementia.      SLP Plan  All goals met;Discharge SLP treatment due to (comment);Consult other service (comment) (ST at SNF to advance diet)      Recommendations for follow up therapy are one component of a multi-disciplinary discharge planning process, led by the attending physician.  Recommendations may be updated based on patient status, additional functional criteria and insurance authorization.    Recommendations  Diet recommendations: Dysphagia 1 (puree);Thin liquid Liquids provided via: Cup;Straw Medication Administration: Whole meds with puree Supervision: Staff to assist with self feeding;Full supervision/cueing for compensatory strategies Compensations: Slow rate;Small sips/bites Postural Changes and/or Swallow Maneuvers: Seated upright  90 degrees                Oral Care Recommendations: Oral care BID Follow Up Recommendations: Skilled nursing-short term rehab (<3 hours/day) Assistance recommended at discharge: Frequent or constant Supervision/Assistance SLP Visit Diagnosis: Dysphagia, unspecified (R13.10) Plan: All goals met;Discharge SLP treatment due to (comment);Consult other service (comment) (ST at SNF to advance diet)           Houston Siren  09/25/2022, 10:09 AM

## 2022-09-25 NOTE — Telephone Encounter (Signed)
(  9:50 am) PC SW received an email from patient's wife-[name] advising that patient was admitted to [facility] on Saturday following a fall. She advises that the plan is for patient to go to short-term  inpatient rehab post discharge.   SW alerted the [facility] admin team and [facility] Liaison team team for follow-up.

## 2022-09-25 NOTE — Progress Notes (Signed)
TOC CSW spoke with Abigail Butts and Nicole Kindred @ [facility].  They stated pt could come in the morning.  Lynkin Saini Tarpley-Carter, MSW, LCSW-A Pronouns:  She/Her/Hers [facility] Clinical Social Worker Direct Number:  [phone] [email or web address]

## 2022-09-25 NOTE — ED Notes (Signed)
Pt gown and brief changed. Pt also placed in male purewick. Pt resting comfortably in bed sleeping at this time

## 2022-09-25 NOTE — ED Notes (Signed)
Pt sleeping received breakfast tray.

## 2022-09-25 NOTE — ED Notes (Signed)
Speech therapy with patient.

## 2022-09-26 DIAGNOSIS — M6281 Muscle weakness (generalized): Secondary | ICD-10-CM | POA: Diagnosis not present

## 2022-09-26 DIAGNOSIS — F0390 Unspecified dementia without behavioral disturbance: Secondary | ICD-10-CM | POA: Diagnosis not present

## 2022-09-26 DIAGNOSIS — L97529 Non-pressure chronic ulcer of other part of left foot with unspecified severity: Secondary | ICD-10-CM | POA: Diagnosis not present

## 2022-09-26 DIAGNOSIS — N1831 Chronic kidney disease, stage 3a: Secondary | ICD-10-CM | POA: Diagnosis not present

## 2022-09-26 DIAGNOSIS — S51012A Laceration without foreign body of left elbow, initial encounter: Secondary | ICD-10-CM | POA: Diagnosis not present

## 2022-09-26 DIAGNOSIS — I10 Essential (primary) hypertension: Secondary | ICD-10-CM | POA: Diagnosis not present

## 2022-09-26 DIAGNOSIS — I509 Heart failure, unspecified: Secondary | ICD-10-CM | POA: Diagnosis not present

## 2022-09-26 DIAGNOSIS — R296 Repeated falls: Secondary | ICD-10-CM | POA: Diagnosis not present

## 2022-09-26 DIAGNOSIS — R5383 Other fatigue: Secondary | ICD-10-CM | POA: Diagnosis not present

## 2022-09-26 DIAGNOSIS — Z9181 History of falling: Secondary | ICD-10-CM | POA: Diagnosis not present

## 2022-09-26 DIAGNOSIS — I129 Hypertensive chronic kidney disease with stage 1 through stage 4 chronic kidney disease, or unspecified chronic kidney disease: Secondary | ICD-10-CM | POA: Diagnosis not present

## 2022-09-26 DIAGNOSIS — F40231 Fear of injections and transfusions: Secondary | ICD-10-CM | POA: Diagnosis not present

## 2022-09-26 DIAGNOSIS — J9809 Other diseases of bronchus, not elsewhere classified: Secondary | ICD-10-CM | POA: Diagnosis not present

## 2022-09-26 DIAGNOSIS — D75839 Thrombocytosis, unspecified: Secondary | ICD-10-CM | POA: Diagnosis not present

## 2022-09-26 DIAGNOSIS — R269 Unspecified abnormalities of gait and mobility: Secondary | ICD-10-CM | POA: Diagnosis not present

## 2022-09-26 DIAGNOSIS — R600 Localized edema: Secondary | ICD-10-CM | POA: Diagnosis not present

## 2022-09-26 DIAGNOSIS — F39 Unspecified mood [affective] disorder: Secondary | ICD-10-CM | POA: Diagnosis not present

## 2022-09-26 DIAGNOSIS — F03C0 Unspecified dementia, severe, without behavioral disturbance, psychotic disturbance, mood disturbance, and anxiety: Secondary | ICD-10-CM | POA: Diagnosis not present

## 2022-09-26 DIAGNOSIS — R413 Other amnesia: Secondary | ICD-10-CM | POA: Diagnosis not present

## 2022-09-26 DIAGNOSIS — R32 Unspecified urinary incontinence: Secondary | ICD-10-CM | POA: Diagnosis not present

## 2022-09-26 DIAGNOSIS — R0689 Other abnormalities of breathing: Secondary | ICD-10-CM | POA: Diagnosis not present

## 2022-09-26 DIAGNOSIS — I5020 Unspecified systolic (congestive) heart failure: Secondary | ICD-10-CM | POA: Diagnosis not present

## 2022-09-26 DIAGNOSIS — R7989 Other specified abnormal findings of blood chemistry: Secondary | ICD-10-CM | POA: Diagnosis not present

## 2022-09-26 DIAGNOSIS — F02811 Dementia in other diseases classified elsewhere, unspecified severity, with agitation: Secondary | ICD-10-CM | POA: Diagnosis not present

## 2022-09-26 DIAGNOSIS — Z79899 Other long term (current) drug therapy: Secondary | ICD-10-CM | POA: Diagnosis not present

## 2022-09-26 DIAGNOSIS — I5022 Chronic systolic (congestive) heart failure: Secondary | ICD-10-CM | POA: Diagnosis not present

## 2022-09-26 DIAGNOSIS — J9811 Atelectasis: Secondary | ICD-10-CM | POA: Diagnosis not present

## 2022-09-26 DIAGNOSIS — K8020 Calculus of gallbladder without cholecystitis without obstruction: Secondary | ICD-10-CM | POA: Diagnosis not present

## 2022-09-26 DIAGNOSIS — R456 Violent behavior: Secondary | ICD-10-CM | POA: Diagnosis not present

## 2022-09-26 DIAGNOSIS — I5040 Unspecified combined systolic (congestive) and diastolic (congestive) heart failure: Secondary | ICD-10-CM | POA: Diagnosis not present

## 2022-09-26 DIAGNOSIS — R918 Other nonspecific abnormal finding of lung field: Secondary | ICD-10-CM | POA: Diagnosis not present

## 2022-09-26 DIAGNOSIS — D45 Polycythemia vera: Secondary | ICD-10-CM | POA: Diagnosis not present

## 2022-09-26 DIAGNOSIS — R1310 Dysphagia, unspecified: Secondary | ICD-10-CM | POA: Diagnosis not present

## 2022-09-26 DIAGNOSIS — Z7401 Bed confinement status: Secondary | ICD-10-CM | POA: Diagnosis not present

## 2022-09-26 DIAGNOSIS — I130 Hypertensive heart and chronic kidney disease with heart failure and stage 1 through stage 4 chronic kidney disease, or unspecified chronic kidney disease: Secondary | ICD-10-CM | POA: Diagnosis not present

## 2022-09-26 DIAGNOSIS — G309 Alzheimer's disease, unspecified: Secondary | ICD-10-CM | POA: Diagnosis not present

## 2022-09-26 DIAGNOSIS — D696 Thrombocytopenia, unspecified: Secondary | ICD-10-CM | POA: Diagnosis not present

## 2022-09-26 DIAGNOSIS — I428 Other cardiomyopathies: Secondary | ICD-10-CM | POA: Diagnosis not present

## 2022-09-26 DIAGNOSIS — R531 Weakness: Secondary | ICD-10-CM | POA: Diagnosis not present

## 2022-09-26 DIAGNOSIS — R262 Difficulty in walking, not elsewhere classified: Secondary | ICD-10-CM | POA: Diagnosis not present

## 2022-09-26 DIAGNOSIS — R0989 Other specified symptoms and signs involving the circulatory and respiratory systems: Secondary | ICD-10-CM | POA: Diagnosis not present

## 2022-09-26 DIAGNOSIS — E611 Iron deficiency: Secondary | ICD-10-CM | POA: Diagnosis not present

## 2022-09-26 NOTE — ED Notes (Signed)
Pt sleeping confirmed chest rise and fall safety sitter at bedside

## 2022-09-26 NOTE — ED Provider Notes (Signed)
Emergency Medicine Observation Re-evaluation Note  James Bryant is a 77 y.o. male, seen on rounds today.  Pt initially presented to the ED for complaints of Fall Currently, the patient is sleeping.  Physical Exam  BP (!) 151/77   Pulse (!) 57   Temp 98 F (36.7 C) (Oral)   Resp 19   Ht '5\' 7"'$  (1.702 m)   Wt 84 kg   SpO2 100%   BMI 29.00 kg/m  Physical Exam General: Sleeping Cardiac: Normal heart rate Lungs: Nonlabored, currently on 2 L of supplemental oxygen with SpO2 100% Psych: Deferred  ED Course / MDM  EKG:   I have reviewed the labs performed to date as well as medications administered while in observation.  Recent changes in the last 24 hours include patient accepted to [facility] with plan for transfer today.  Plan  Current plan is for SNF placement    Godfrey Pick, MD 09/26/22 [phone]

## 2022-09-26 NOTE — Progress Notes (Addendum)
Patient will discharge to [facility] rehab today. Patient is going to room 3234. Number for report is [phone]. CSW will inform patients RN.

## 2022-09-26 NOTE — ED Notes (Signed)
Family arrived and at bedside, pt sleeping confirmed chest rise and fall

## 2022-09-26 NOTE — ED Notes (Signed)
Safety sitter at bedside 

## 2022-09-26 NOTE — ED Notes (Signed)
Pt placed on hospital bed for comfort.

## 2022-09-26 NOTE — ED Notes (Signed)
Report called to [facility] snf given to cathleen rn

## 2022-09-26 NOTE — ED Notes (Signed)
Ptar called to transport pt to facility

## 2022-09-26 NOTE — ED Notes (Signed)
Ptar given report family updated pt being taking to facility room 3234

## 2022-09-27 ENCOUNTER — Other Ambulatory Visit: Payer: Self-pay | Admitting: Medical

## 2022-09-27 DIAGNOSIS — R1310 Dysphagia, unspecified: Secondary | ICD-10-CM | POA: Diagnosis not present

## 2022-09-27 DIAGNOSIS — G309 Alzheimer's disease, unspecified: Secondary | ICD-10-CM | POA: Diagnosis not present

## 2022-09-27 DIAGNOSIS — I5040 Unspecified combined systolic (congestive) and diastolic (congestive) heart failure: Secondary | ICD-10-CM | POA: Diagnosis not present

## 2022-09-27 DIAGNOSIS — N1831 Chronic kidney disease, stage 3a: Secondary | ICD-10-CM | POA: Diagnosis not present

## 2022-09-27 DIAGNOSIS — E611 Iron deficiency: Secondary | ICD-10-CM | POA: Diagnosis not present

## 2022-09-27 DIAGNOSIS — D696 Thrombocytopenia, unspecified: Secondary | ICD-10-CM | POA: Diagnosis not present

## 2022-09-27 DIAGNOSIS — R413 Other amnesia: Secondary | ICD-10-CM | POA: Diagnosis not present

## 2022-09-27 DIAGNOSIS — R296 Repeated falls: Secondary | ICD-10-CM | POA: Diagnosis not present

## 2022-09-27 DIAGNOSIS — I10 Essential (primary) hypertension: Secondary | ICD-10-CM | POA: Diagnosis not present

## 2022-09-28 DIAGNOSIS — I5040 Unspecified combined systolic (congestive) and diastolic (congestive) heart failure: Secondary | ICD-10-CM | POA: Diagnosis not present

## 2022-09-28 DIAGNOSIS — G309 Alzheimer's disease, unspecified: Secondary | ICD-10-CM | POA: Diagnosis not present

## 2022-09-28 DIAGNOSIS — I10 Essential (primary) hypertension: Secondary | ICD-10-CM | POA: Diagnosis not present

## 2022-09-28 DIAGNOSIS — D696 Thrombocytopenia, unspecified: Secondary | ICD-10-CM | POA: Diagnosis not present

## 2022-09-28 DIAGNOSIS — N1831 Chronic kidney disease, stage 3a: Secondary | ICD-10-CM | POA: Diagnosis not present

## 2022-09-29 ENCOUNTER — Other Ambulatory Visit: Payer: Self-pay | Admitting: Physician Assistant

## 2022-09-29 ENCOUNTER — Ambulatory Visit: Payer: Medicare HMO | Admitting: Cardiovascular Disease

## 2022-09-29 DIAGNOSIS — R269 Unspecified abnormalities of gait and mobility: Secondary | ICD-10-CM | POA: Diagnosis not present

## 2022-09-29 DIAGNOSIS — R531 Weakness: Secondary | ICD-10-CM | POA: Diagnosis not present

## 2022-09-29 DIAGNOSIS — G309 Alzheimer's disease, unspecified: Secondary | ICD-10-CM | POA: Diagnosis not present

## 2022-09-29 DIAGNOSIS — F39 Unspecified mood [affective] disorder: Secondary | ICD-10-CM | POA: Diagnosis not present

## 2022-09-30 ENCOUNTER — Other Ambulatory Visit: Payer: Self-pay | Admitting: Medical

## 2022-10-02 DIAGNOSIS — G309 Alzheimer's disease, unspecified: Secondary | ICD-10-CM | POA: Diagnosis not present

## 2022-10-02 DIAGNOSIS — I5040 Unspecified combined systolic (congestive) and diastolic (congestive) heart failure: Secondary | ICD-10-CM | POA: Diagnosis not present

## 2022-10-02 DIAGNOSIS — I129 Hypertensive chronic kidney disease with stage 1 through stage 4 chronic kidney disease, or unspecified chronic kidney disease: Secondary | ICD-10-CM | POA: Diagnosis not present

## 2022-10-03 DIAGNOSIS — G309 Alzheimer's disease, unspecified: Secondary | ICD-10-CM | POA: Diagnosis not present

## 2022-10-03 DIAGNOSIS — I5040 Unspecified combined systolic (congestive) and diastolic (congestive) heart failure: Secondary | ICD-10-CM | POA: Diagnosis not present

## 2022-10-03 DIAGNOSIS — I129 Hypertensive chronic kidney disease with stage 1 through stage 4 chronic kidney disease, or unspecified chronic kidney disease: Secondary | ICD-10-CM | POA: Diagnosis not present

## 2022-10-03 DIAGNOSIS — D696 Thrombocytopenia, unspecified: Secondary | ICD-10-CM | POA: Diagnosis not present

## 2022-10-09 DIAGNOSIS — I129 Hypertensive chronic kidney disease with stage 1 through stage 4 chronic kidney disease, or unspecified chronic kidney disease: Secondary | ICD-10-CM | POA: Diagnosis not present

## 2022-10-09 DIAGNOSIS — I5040 Unspecified combined systolic (congestive) and diastolic (congestive) heart failure: Secondary | ICD-10-CM | POA: Diagnosis not present

## 2022-10-09 DIAGNOSIS — R600 Localized edema: Secondary | ICD-10-CM | POA: Diagnosis not present

## 2022-10-09 DIAGNOSIS — G309 Alzheimer's disease, unspecified: Secondary | ICD-10-CM | POA: Diagnosis not present

## 2022-10-11 ENCOUNTER — Other Ambulatory Visit: Payer: Self-pay | Admitting: Psychiatry

## 2022-10-13 DIAGNOSIS — R600 Localized edema: Secondary | ICD-10-CM | POA: Diagnosis not present

## 2022-10-13 DIAGNOSIS — I129 Hypertensive chronic kidney disease with stage 1 through stage 4 chronic kidney disease, or unspecified chronic kidney disease: Secondary | ICD-10-CM | POA: Diagnosis not present

## 2022-10-13 DIAGNOSIS — I5040 Unspecified combined systolic (congestive) and diastolic (congestive) heart failure: Secondary | ICD-10-CM | POA: Diagnosis not present

## 2022-10-13 DIAGNOSIS — G309 Alzheimer's disease, unspecified: Secondary | ICD-10-CM | POA: Diagnosis not present

## 2022-10-16 DIAGNOSIS — R600 Localized edema: Secondary | ICD-10-CM | POA: Diagnosis not present

## 2022-10-16 DIAGNOSIS — I129 Hypertensive chronic kidney disease with stage 1 through stage 4 chronic kidney disease, or unspecified chronic kidney disease: Secondary | ICD-10-CM | POA: Diagnosis not present

## 2022-10-16 DIAGNOSIS — I5040 Unspecified combined systolic (congestive) and diastolic (congestive) heart failure: Secondary | ICD-10-CM | POA: Diagnosis not present

## 2022-10-16 DIAGNOSIS — G309 Alzheimer's disease, unspecified: Secondary | ICD-10-CM | POA: Diagnosis not present

## 2022-10-18 ENCOUNTER — Non-Acute Institutional Stay: Payer: Medicare HMO | Admitting: Family Medicine

## 2022-10-18 ENCOUNTER — Encounter: Payer: Self-pay | Admitting: Family Medicine

## 2022-10-18 VITALS — BP 104/60 | HR 89 | Temp 97.3°F | Resp 18 | Wt 143.0 lb

## 2022-10-18 DIAGNOSIS — R7989 Other specified abnormal findings of blood chemistry: Secondary | ICD-10-CM

## 2022-10-18 DIAGNOSIS — Z9181 History of falling: Secondary | ICD-10-CM | POA: Insufficient documentation

## 2022-10-18 DIAGNOSIS — R0989 Other specified symptoms and signs involving the circulatory and respiratory systems: Secondary | ICD-10-CM | POA: Insufficient documentation

## 2022-10-18 DIAGNOSIS — I5020 Unspecified systolic (congestive) heart failure: Secondary | ICD-10-CM | POA: Insufficient documentation

## 2022-10-18 DIAGNOSIS — R5383 Other fatigue: Secondary | ICD-10-CM | POA: Diagnosis not present

## 2022-10-18 DIAGNOSIS — F03C0 Unspecified dementia, severe, without behavioral disturbance, psychotic disturbance, mood disturbance, and anxiety: Secondary | ICD-10-CM | POA: Diagnosis not present

## 2022-10-18 DIAGNOSIS — K8020 Calculus of gallbladder without cholecystitis without obstruction: Secondary | ICD-10-CM | POA: Diagnosis not present

## 2022-10-18 HISTORY — DX: Calculus of gallbladder without cholecystitis without obstruction: K80.20

## 2022-10-18 NOTE — Progress Notes (Signed)
[facility] Consult Note Telephone: [phone]  Fax: [phone]   Date of encounter: 10/18/22 4:10 PM PATIENT NAME: James Bryant [address] [place] [address]   [phone] (home)  DOB: Nov 24, 1945 MRN: [id number] PRIMARY CARE PROVIDER:    Caryl Bryant,  [address] [place] [address] [phone]  REFERRING PROVIDER:   Dr James Bryant   Emergency Contacts:    Contact Information     Name Relation Home Work Mobile   James Bryant Spouse [place] Daughter   [phone]        I met face to face with patient in [place] [facility] facility. Palliative Care was asked to follow this patient by consultation request of Tysinger, Camelia Eng, PA-C to address advance care planning and complex medical decision making. This is an initial visit.  ADVANCE CARE PLANNING/GOALS OF CARE:   Review of existing advance directive documents.  CODE STATUS: DNR signed 06/09/22 by PCP James Bryant in Epic Chart. MOST form on facility chart signed 09/25/22 by wife and facility provider-James Bryant: Full Code/Full Scope of treatment including antibiotics and IV fluids if indicated. Feeding tube is not addressed.   Need to address with family conflicting orders to see what the actual wishes are and what is their understanding of his illness and prognosis.   ASSESSMENT AND / RECOMMENDATIONS:  PPS: [id number] Systolic CHF with reduced EF, NYHA Class II Continue Coreg, Valsartan and lasix with potassium replacement.  Severe Dementia unspecified FAST 7 score 6e Continue Aricept and Olanzapine for mood stabilization and psychosis. Need to provide disease course education. Follow up with goals of care for clarification with family.  Throat clearing Recommend ST evaluation for possible dysphagia Keep pt upright for meals and intake and for at least 45-60 minutes.  4.    Calculus of GB without cholecystitis or obstruction/Elevated LFTs Recommend repeat CMP within 1-2 weeks or if symptomatic. Not a good surgical candidate due to advanced dementia and heart disease.  5.    At risk for falls PT eval and treat to see if there is safe method of mobility or needs additional DME.  6.    Caregiver Fatigue Address potential options for assistance with care depending on the setting. Private pay or PCS versus long term placement depending on pt's identified needs and family goals.    Follow up Palliative Care Visit:  Palliative Care continuing to follow up by monitoring for changes in appetite, weight, functional and cognitive status for chronic disease progression and management in agreement with patient's stated goals of care. Next visit in 2-3 weeks or prn.  This visit was coded based on medical decision making (MDM).  Chief Complaint  Palliative Care received referral to follow pt who was admitted to SNF rehab following a fall and ED visit.  HISTORY OF PRESENT ILLNESS: James Bryant is a 77 y.o. year old male with recent ED visit after a fall where he was noted to have elevated transaminase and bilirubin with evidence of cholelithiasis but no cholecystitis or bile duct obstruction. He was noted to have layering sludge in the gallbladder but negative sonographic murphy sign.  Pt is seen sitting in a chair at the bedside after dinner.  Denies pain, sob, recent falls, or coughing or choking after eating or drinking. He was recently observed in the ED for 3 days after a fall. He was noted to have elevated alkaline phosphatase and LFTs.  He underwent RUQ [place] which showed cholelithiasis of the gallbladder with layering sludge, negative sonographic Murphy's sign and no CBD dilatation or acute cholecystitis.  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  At that time he was being worked up for [employer] issues, had stopped  driving, was demonstrating difficulty with paying bills.  He was seen by [facility] of [facility] Neurologist who ordered Vitamin B12 and RPR, brain MRI. His MoCA score on that visit was 4/10 indicating severe dementia and he was started on Aricept 5 mg daily. He has been noted to have intermittent agitation and to be removing his clothes inappropriately at times.  He was also noted to have significant weight gain and on 1/12/ 24 his BNP was elevated at almost 5000.  His wife and daughter are primary caregivers, per notes in Epic EMR, who have expressed a desire to keep him at home as long as possible but also significant fatigue in caring for him.  He was previously on service with [facility] but revoked to be able to participate in therapy.  Family reported that pt was showing significant progression that they did not feel had been adequately addressed by hospice and wanted to change to Palliative Service line. James Bryant provided family with resources for possible help in home, private pay as pt did not qualify for [facility] services since he is not on Medicaid.  Pt endorses some orthopnea and PND symptoms which are about the same and no longer needs O2.  He states plan is for him to be d/c'd home tomorrow and states home is "[place]".    ACTIVITIES OF DAILY LIVING: CONTINENT OF BLADDER/BOWEL? No  MOBILITY:   INDEPENDENTLY AMBULATORY?  No  AMBULATORY WITH ASSISTIVE DEVICE: max assist with rolling walker for gait stability  WHEELCHAIR-intermittently uses a WC for mobility  APPETITE?  Good Weight 143 lbs (down from 184 lbs last fall, in part due to significant edema in BLE/CHF.  CURRENT PROBLEM LIST:  Patient Active Problem List   Diagnosis Date Noted   Memory loss 05/31/2022   Balance problem 05/31/2022   Coordination abnormal 05/31/2022   Urinary incontinence 04/06/2022   Dependence on cane 12/26/2021   Primary hypertension 12/20/2021   Alkaline phosphatase  elevation 04/08/2021   Congestive heart failure, NYHA class 2 ([place]) 04/08/2021   Iron deficiency 04/08/2021   Edema 03/23/2021   Decreased activities of daily living (ADL) 03/23/2021   Mobility impaired 03/23/2021   Stage 3a chronic kidney disease ([place]) 02/28/2021   Vitamin D deficiency 01/07/2020   Polycythemia vera ([place]) 02/26/2019   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   Elevated liver function tests 01/17/2019   Thrombocytosis 01/17/2019   Former smoker, stopped smoking many years ago 01/17/2019   Personal history of noncompliance with medical treatment, presenting hazards to health 01/09/2019   PAST MEDICAL HISTORY:  Active Ambulatory Problems    Diagnosis Date Noted   Personal history of noncompliance with medical treatment, presenting hazards to health 01/09/2019   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   Elevated liver function tests 01/17/2019   Thrombocytosis 01/17/2019   Former smoker, stopped smoking many years ago 01/17/2019   Polycythemia vera ([place]) 02/26/2019   Vitamin D deficiency 01/07/2020   Stage 3a chronic kidney disease ([place]) 02/28/2021   Edema 03/23/2021   Decreased activities of daily living (ADL) 03/23/2021   Mobility impaired 03/23/2021   Alkaline phosphatase elevation 04/08/2021   Congestive heart  failure, NYHA class 2 ([place]) 04/08/2021   Iron deficiency 04/08/2021   Primary hypertension 12/20/2021   Dependence on cane 12/26/2021   Urinary incontinence 04/06/2022   Memory loss 05/31/2022   Balance problem 05/31/2022   Coordination abnormal 05/31/2022   Resolved Ambulatory Problems    Diagnosis Date Noted   Uncontrolled hypertension 01/09/2019   Fatigue 01/09/2019   Elevated serum creatinine 01/17/2019   Polycythemia 01/17/2019   Abnormal lung field 02/28/2021   SOB (shortness of breath) 03/23/2021   Leukocytosis 03/23/2021   Noncompliance 04/08/2021   Dyspnea 04/08/2021   Past Medical History:  Diagnosis Date   Fear of needles 01/17/2019    Hypertension    SOCIAL HX:  Social History   Tobacco Use   Smoking status: Former   Smokeless tobacco: Never  Substance Use Topics   Alcohol use: No    Comment: stopped many years ago    FAMILY HX:  Family History  Problem Relation Age of Onset   Heart disease Mother    Hypertension Mother        Preferred Pharmacy: ALLERGIES: No Known Allergies   PERTINENT MEDICATIONS:  Outpatient Encounter Medications as of 10/18/2022  Medication Sig   albuterol (PROVENTIL) (2.5 MG/3ML) 0.083% nebulizer solution Take 3 mLs (2.5 mg total) by nebulization every 6 (six) hours as needed for wheezing or shortness of breath.   ASPIRIN LOW DOSE 81 MG tablet TAKE 1 TABLET EVERY DAY   brimonidine (ALPHAGAN) 0.2 % ophthalmic solution SMARTSIG:In Eye(s)   carvedilol (COREG) 6.25 MG tablet TAKE 1 TABLET TWICE DAILY (NEED MD APPOINTMENT) (Patient taking differently: Take 6.25 mg by mouth 2 (two) times daily with a meal.)   donepezil (ARICEPT) 5 MG tablet TAKE 1 TABLET AT BEDTIME   ferrous gluconate (FERGON) 324 MG tablet TAKE 1 TABLET EVERY DAY WITH BREAKFAST (Patient taking differently: Take 324 mg by mouth daily with breakfast.)   furosemide (LASIX) 80 MG tablet TAKE 1 TABLET(80 MG) BY MOUTH TWICE DAILY   hydroxyurea (HYDREA) 500 MG capsule TAKE 1 CAPSULE (500 MG TOTAL) BY MOUTH 2 (TWO) TIMES DAILY. MAY TAKE WITH FOOD TO MINIMIZE GI SIDE EFFECTS.   LORazepam (ATIVAN) 0.5 MG tablet Take 1 tablet (0.5 mg total) by mouth 2 (two) times daily. (Patient taking differently: Take 0.5 mg by mouth every evening.)   Multiple Vitamins-Minerals (MEGA MULTIVITAMIN FOR MEN PO) Take 1 tablet by mouth daily.   nystatin cream (MYCOSTATIN) Apply 1 Application topically as needed.   OLANZapine (ZYPREXA) 5 MG tablet Take 1 tablet (5 mg total) by mouth at bedtime. Take one tablet nightly at 7 pm   potassium chloride (KLOR-CON) 10 MEQ tablet TAKE 1 TABLET(10 MEQ) BY MOUTH TWICE DAILY   valsartan (DIOVAN) 160 MG tablet Take 1  tablet (160 mg total) by mouth daily.   No facility-administered encounter medications on file as of 10/18/2022.    History obtained from review of EMR, discussion with facility staff, review of [facility] clinician team notes and/or patient.   CBC    Component Value Date/Time   WBC 3.6 (L) 09/23/2022 1547   RBC 3.40 (L) 09/23/2022 1547   HGB 14.0 09/23/2022 1547   HGB 12.3 (L) 08/31/2022 1111   HCT 41.3 09/23/2022 1547   HCT 34.9 (L) 08/31/2022 1111   PLT 167 09/23/2022 1547   PLT 134 (L) 08/31/2022 1111   MCV 121.5 (H) 09/23/2022 1547   MCV 112 (H) 08/31/2022 1111   MCH 41.2 (H) 09/23/2022 1547   MCHC 33.9 09/23/2022  1547   RDW 17.5 (H) 09/23/2022 1547   RDW 15.2 08/31/2022 1111   LYMPHSABS 0.9 08/31/2022 1111   MONOABS 0.9 02/12/2019 1202   EOSABS 0.0 08/31/2022 1111   BASOSABS 0.1 08/31/2022 1111    CMP    Latest Ref Rng & Units 09/25/2022    1:00 PM 09/23/2022    3:47 PM 08/31/2022   11:11 AM  CMP  Glucose 70 - 99 mg/dL 86  125  109   BUN 8 - 23 mg/dL '16  18  20   '$ Creatinine 0.61 - 1.24 mg/dL 1.50  1.64  1.65   Sodium 135 - 145 mmol/L 140  136  144   Potassium 3.5 - 5.1 mmol/L 3.4  3.7  3.5   Chloride 98 - 111 mmol/L 100  98  102   CO2 22 - 32 mmol/L '29  26  25   '$ Calcium 8.9 - 10.3 mg/dL 8.9  9.1  9.1   Total Protein 6.5 - 8.1 g/dL 6.6  7.2  7.2   Total Bilirubin 0.3 - 1.2 mg/dL 5.2  4.4  3.7   Alkaline Phos 38 - 126 U/L 107  125  173   AST 15 - 41 U/L 51  42  34   ALT 0 - 44 U/L '21  17  15     '$ LFTs    Latest Ref Rng & Units 09/25/2022    1:00 PM 09/23/2022    3:47 PM 08/31/2022   11:11 AM  Hepatic Function  Total Protein 6.5 - 8.1 g/dL 6.6  7.2  7.2   Albumin 3.5 - 5.0 g/dL 2.8  2.9  3.5   AST 15 - 41 U/L 51  42  34   ALT 0 - 44 U/L '21  17  15   '$ Alk Phosphatase 38 - 126 U/L 107  125  173   Total Bilirubin 0.3 - 1.2 mg/dL 5.2  4.4  3.7     Urinalysis    Component Value Date/Time   COLORURINE YELLOW 09/23/2022 1845   APPEARANCEUR CLEAR 09/23/2022 1845    APPEARANCEUR Cloudy (A) 08/31/2022 1111   LABSPEC 1.009 09/23/2022 1845   LABSPEC 1.015 04/06/2022 1307   PHURINE 6.0 09/23/2022 1845   GLUCOSEU NEGATIVE 09/23/2022 1845   HGBUR NEGATIVE 09/23/2022 1845   BILIRUBINUR NEGATIVE 09/23/2022 1845   BILIRUBINUR CANCELED 08/31/2022 1111   KETONESUR NEGATIVE 09/23/2022 1845   PROTEINUR NEGATIVE 09/23/2022 1845   NITRITE NEGATIVE 09/23/2022 1845   LEUKOCYTESUR NEGATIVE 09/23/2022 1845   BNP (last 3 results) Recent Labs    08/31/22 1111  BNP 4,276.1*   Last 2d echo 03/05/19: LV ef 25-30%.  Diffuse LV hypokinesis, diastolic parameters consistent with impaired relaxation. RV with normal systolic function and mildly enlarged cavity size. Mild biatrial enlargement with no atrial level shunt. Mild MR, TR.  I reviewed available labs, medications, imaging, studies and related documents from the EMR. Records reviewed and summarized above.   Physical Exam: GENERAL: NAD LUNGS: CTAB, diminished throughout, no increased work of breathing, room air CARDIAC:  S1S2, RRR with no MRG, non-pitting trace BLE to shin edema, 1-2 + ankle edema on right/1+ ankle on left, No cyanosis ABD:  Normo-active BS x 4 quads, soft, non-tender.  Has moderately large ventral hernia, soft and easily reducible, non-tender to palpation  EXTREMITIES: Normal ROM, no deformity, strength decreased and worse in left, No muscle atrophy/subcutaneous fat loss NEURO:  Noted generalized weakness, slightly worse on the left, noted cognitive impairment with  possible expressive aphasia PSYCH:  non-anxious affect, A & O x 1  Thank you for the opportunity to participate in the care of [employer]. Please call our main office at [phone] if we can be of additional assistance.    Damaris Hippo FNP-C  Lakyn Mantione.[email or web address] .[facility]  Phone:  [phone]

## 2022-10-20 DIAGNOSIS — R600 Localized edema: Secondary | ICD-10-CM | POA: Diagnosis not present

## 2022-10-20 DIAGNOSIS — G309 Alzheimer's disease, unspecified: Secondary | ICD-10-CM | POA: Diagnosis not present

## 2022-10-20 DIAGNOSIS — I5040 Unspecified combined systolic (congestive) and diastolic (congestive) heart failure: Secondary | ICD-10-CM | POA: Diagnosis not present

## 2022-10-20 DIAGNOSIS — I129 Hypertensive chronic kidney disease with stage 1 through stage 4 chronic kidney disease, or unspecified chronic kidney disease: Secondary | ICD-10-CM | POA: Diagnosis not present

## 2022-10-21 ENCOUNTER — Other Ambulatory Visit: Payer: Self-pay

## 2022-10-21 ENCOUNTER — Emergency Department (HOSPITAL_COMMUNITY): Payer: Medicare HMO

## 2022-10-21 ENCOUNTER — Emergency Department (HOSPITAL_COMMUNITY)
Admission: EM | Admit: 2022-10-21 | Discharge: 2022-10-21 | Disposition: A | Discharge status: Home / Self Care | Payer: Medicare HMO | Attending: Emergency Medicine | Admitting: Emergency Medicine

## 2022-10-21 ENCOUNTER — Encounter (HOSPITAL_COMMUNITY): Payer: Self-pay

## 2022-10-21 DIAGNOSIS — I10 Essential (primary) hypertension: Secondary | ICD-10-CM | POA: Diagnosis not present

## 2022-10-21 DIAGNOSIS — R609 Edema, unspecified: Secondary | ICD-10-CM | POA: Diagnosis not present

## 2022-10-21 DIAGNOSIS — F0390 Unspecified dementia without behavioral disturbance: Secondary | ICD-10-CM | POA: Insufficient documentation

## 2022-10-21 DIAGNOSIS — I119 Hypertensive heart disease without heart failure: Secondary | ICD-10-CM | POA: Diagnosis not present

## 2022-10-21 DIAGNOSIS — Z7982 Long term (current) use of aspirin: Secondary | ICD-10-CM | POA: Diagnosis not present

## 2022-10-21 DIAGNOSIS — I6782 Cerebral ischemia: Secondary | ICD-10-CM | POA: Diagnosis not present

## 2022-10-21 DIAGNOSIS — Z79899 Other long term (current) drug therapy: Secondary | ICD-10-CM | POA: Insufficient documentation

## 2022-10-21 DIAGNOSIS — M1712 Unilateral primary osteoarthritis, left knee: Secondary | ICD-10-CM | POA: Diagnosis not present

## 2022-10-21 DIAGNOSIS — Z87891 Personal history of nicotine dependence: Secondary | ICD-10-CM | POA: Diagnosis not present

## 2022-10-21 DIAGNOSIS — M7042 Prepatellar bursitis, left knee: Secondary | ICD-10-CM | POA: Diagnosis not present

## 2022-10-21 DIAGNOSIS — I130 Hypertensive heart and chronic kidney disease with heart failure and stage 1 through stage 4 chronic kidney disease, or unspecified chronic kidney disease: Secondary | ICD-10-CM | POA: Diagnosis not present

## 2022-10-21 DIAGNOSIS — R7402 Elevation of levels of lactic acid dehydrogenase (LDH): Secondary | ICD-10-CM | POA: Insufficient documentation

## 2022-10-21 DIAGNOSIS — Y9389 Activity, other specified: Secondary | ICD-10-CM | POA: Insufficient documentation

## 2022-10-21 DIAGNOSIS — R509 Fever, unspecified: Secondary | ICD-10-CM | POA: Diagnosis not present

## 2022-10-21 DIAGNOSIS — R4182 Altered mental status, unspecified: Secondary | ICD-10-CM | POA: Diagnosis not present

## 2022-10-21 DIAGNOSIS — Z1152 Encounter for screening for COVID-19: Secondary | ICD-10-CM | POA: Diagnosis not present

## 2022-10-21 DIAGNOSIS — R140 Abdominal distension (gaseous): Secondary | ICD-10-CM | POA: Diagnosis not present

## 2022-10-21 DIAGNOSIS — I428 Other cardiomyopathies: Secondary | ICD-10-CM | POA: Diagnosis not present

## 2022-10-21 DIAGNOSIS — N1831 Chronic kidney disease, stage 3a: Secondary | ICD-10-CM | POA: Diagnosis not present

## 2022-10-21 DIAGNOSIS — R531 Weakness: Secondary | ICD-10-CM | POA: Diagnosis not present

## 2022-10-21 DIAGNOSIS — L0390 Cellulitis, unspecified: Secondary | ICD-10-CM

## 2022-10-21 DIAGNOSIS — D45 Polycythemia vera: Secondary | ICD-10-CM | POA: Diagnosis not present

## 2022-10-21 DIAGNOSIS — D75839 Thrombocytosis, unspecified: Secondary | ICD-10-CM | POA: Diagnosis not present

## 2022-10-21 DIAGNOSIS — R32 Unspecified urinary incontinence: Secondary | ICD-10-CM | POA: Diagnosis not present

## 2022-10-21 DIAGNOSIS — M7052 Other bursitis of knee, left knee: Secondary | ICD-10-CM

## 2022-10-21 DIAGNOSIS — R197 Diarrhea, unspecified: Secondary | ICD-10-CM | POA: Diagnosis not present

## 2022-10-21 DIAGNOSIS — F40231 Fear of injections and transfusions: Secondary | ICD-10-CM | POA: Diagnosis not present

## 2022-10-21 DIAGNOSIS — I509 Heart failure, unspecified: Secondary | ICD-10-CM | POA: Diagnosis not present

## 2022-10-21 DIAGNOSIS — M25462 Effusion, left knee: Secondary | ICD-10-CM | POA: Diagnosis not present

## 2022-10-21 DIAGNOSIS — L03116 Cellulitis of left lower limb: Secondary | ICD-10-CM | POA: Diagnosis not present

## 2022-10-21 LAB — C-REACTIVE PROTEIN: CRP: 8.6 mg/dL — ABNORMAL HIGH (ref <1.0)

## 2022-10-21 LAB — LACTIC ACID, PLASMA: Lactic Acid, Venous: 2.3 mmol/L (ref 0.5–1.9)

## 2022-10-21 LAB — URINALYSIS, ROUTINE W REFLEX MICROSCOPIC
Bacteria, UA: NONE SEEN
Glucose, UA: NEGATIVE mg/dL
Ketones, ur: NEGATIVE mg/dL
Leukocytes,Ua: NEGATIVE
Nitrite: NEGATIVE
Protein, ur: 30 mg/dL — AB
Specific Gravity, Urine: 1.014 (ref 1.005–1.030)
pH: 6 (ref 5.0–8.0)

## 2022-10-21 LAB — COMPREHENSIVE METABOLIC PANEL
ALT: 23 U/L (ref 0–44)
AST: 40 U/L (ref 15–41)
Albumin: 2.7 g/dL — ABNORMAL LOW (ref 3.5–5.0)
Alkaline Phosphatase: 143 U/L — ABNORMAL HIGH (ref 38–126)
Anion gap: 11 (ref 5–15)
BUN: 25 mg/dL — ABNORMAL HIGH (ref 8–23)
CO2: 28 mmol/L (ref 22–32)
Calcium: 9 mg/dL (ref 8.9–10.3)
Chloride: 98 mmol/L (ref 98–111)
Creatinine, Ser: 1.57 mg/dL — ABNORMAL HIGH (ref 0.61–1.24)
GFR, Estimated: 45 mL/min — ABNORMAL LOW (ref >60)
Glucose, Bld: 133 mg/dL — ABNORMAL HIGH (ref 70–99)
Potassium: 3.8 mmol/L (ref 3.5–5.1)
Sodium: 137 mmol/L (ref 135–145)
Total Bilirubin: 2.7 mg/dL — ABNORMAL HIGH (ref 0.3–1.2)
Total Protein: 7.8 g/dL (ref 6.5–8.1)

## 2022-10-21 LAB — RESP PANEL BY RT-PCR (RSV, FLU A&B, COVID)  RVPGX2
Influenza A by PCR: NEGATIVE
Influenza B by PCR: NEGATIVE
Resp Syncytial Virus by PCR: NEGATIVE
SARS Coronavirus 2 by RT PCR: NEGATIVE

## 2022-10-21 LAB — CBC WITH DIFFERENTIAL/PLATELET
Abs Immature Granulocytes: 0.05 10*3/uL (ref 0.00–0.07)
Basophils Absolute: 0.1 10*3/uL (ref 0.0–0.1)
Basophils Relative: 1 %
Eosinophils Absolute: 0 10*3/uL (ref 0.0–0.5)
Eosinophils Relative: 1 %
HCT: 34.9 % — ABNORMAL LOW (ref 39.0–52.0)
Hemoglobin: 11.5 g/dL — ABNORMAL LOW (ref 13.0–17.0)
Immature Granulocytes: 1 %
Lymphocytes Relative: 11 %
Lymphs Abs: 0.8 10*3/uL (ref 0.7–4.0)
MCH: 40.2 pg — ABNORMAL HIGH (ref 26.0–34.0)
MCHC: 33 g/dL (ref 30.0–36.0)
MCV: 122 fL — ABNORMAL HIGH (ref 80.0–100.0)
Monocytes Absolute: 0.8 10*3/uL (ref 0.1–1.0)
Monocytes Relative: 12 %
Neutro Abs: 5.2 10*3/uL (ref 1.7–7.7)
Neutrophils Relative %: 74 %
Platelets: 316 10*3/uL (ref 150–400)
RBC: 2.86 MIL/uL — ABNORMAL LOW (ref 4.22–5.81)
RDW: 14.6 % (ref 11.5–15.5)
WBC: 6.9 10*3/uL (ref 4.0–10.5)
nRBC: 0 % (ref 0.0–0.2)

## 2022-10-21 LAB — APTT: aPTT: 32 seconds (ref 24–36)

## 2022-10-21 LAB — PROTIME-INR
INR: 1.2 (ref 0.8–1.2)
Prothrombin Time: 14.8 seconds (ref 11.4–15.2)

## 2022-10-21 LAB — SEDIMENTATION RATE: Sed Rate: 123 mm/hr — ABNORMAL HIGH (ref 0–16)

## 2022-10-21 MED ORDER — DOXYCYCLINE HYCLATE 100 MG PO TABS
100.0000 mg | ORAL_TABLET | Freq: Once | ORAL | Status: AC
  Administered 2022-10-21: 100 mg via ORAL
  Filled 2022-10-21: qty 1

## 2022-10-21 MED ORDER — SODIUM CHLORIDE 0.9 % IV BOLUS
500.0000 mL | Freq: Once | INTRAVENOUS | Status: AC
  Administered 2022-10-21: 500 mL via INTRAVENOUS

## 2022-10-21 MED ORDER — DOXYCYCLINE HYCLATE 100 MG PO CAPS: 100.0000 mg | ORAL_CAPSULE | Freq: Two times a day (BID) | ORAL | 0 refills | Status: AC

## 2022-10-21 NOTE — ED Provider Notes (Signed)
[facility] Provider Note   CSN: [id number] Arrival date & time: 10/21/22  1434     History  Chief Complaint  Patient presents with   Fever   Weakness    James Bryant is a 77 y.o. male.  Patient here with altered mental status.  Possibly a fever with EMS.  He really does not have any specific complaints.  He hit his left knee few days ago been having some pain in his left knee.  He has been able to ambulate with his walker he states.  Home health provider thought he had a fever 100.4.  Supposedly maybe some diarrhea but he denies any abdominal pain or diarrhea.  Does have history of dementia.  He has been needing more assistance than normal and not acting like himself.  Patient has history of hypertension, elevated serum creatinine and elevated liver function tests in the past.  Former smoker.  He denies any cough or sputum production.  Denies any pain or urination.  The history is provided by the patient.       Home Medications Prior to Admission medications   Medication Sig Start Date End Date Taking? Authorizing Provider  doxycycline (VIBRAMYCIN) 100 MG capsule Take 1 capsule (100 mg total) by mouth 2 (two) times daily for 14 days. 10/21/22 11/04/22 Yes Raymondo Garcialopez, DO  albuterol (PROVENTIL) (2.5 MG/3ML) 0.083% nebulizer solution Take 3 mLs (2.5 mg total) by nebulization every 6 (six) hours as needed for wheezing or shortness of breath. 09/06/22   Tysinger, Camelia Eng, PA-C  ASPIRIN LOW DOSE 81 MG tablet TAKE 1 TABLET EVERY DAY 09/29/22   Tysinger, Camelia Eng, PA-C  brimonidine [facility]) 0.2 % ophthalmic solution SMARTSIG:In Eye(s) 07/17/22   [provider]  carvedilol (COREG) 6.25 MG tablet TAKE 1 TABLET TWICE DAILY (NEED MD APPOINTMENT) Patient taking differently: Take 6.25 mg by mouth 2 (two) times daily with a meal. 01/09/22   Francis Gaines B, PA-C  donepezil (ARICEPT) 5 MG tablet TAKE 1 TABLET AT BEDTIME 10/11/22   Genia Harold, MD  ferrous gluconate (FERGON) 324 MG tablet TAKE 1 TABLET EVERY DAY WITH BREAKFAST Patient taking differently: Take 324 mg by mouth daily with breakfast. 12/29/21   Francis Gaines B, PA-C  furosemide (LASIX) 80 MG tablet TAKE 1 TABLET(80 MG) BY MOUTH TWICE DAILY 10/02/22   Tysinger, Camelia Eng, PA-C  hydroxyurea (HYDREA) 500 MG capsule TAKE 1 CAPSULE (500 MG TOTAL) BY MOUTH 2 (TWO) TIMES DAILY. MAY TAKE WITH FOOD TO MINIMIZE GI SIDE EFFECTS. 01/09/22   Francis Gaines B, PA-C  LORazepam (ATIVAN) 0.5 MG tablet Take 1 tablet (0.5 mg total) by mouth 2 (two) times daily. Patient taking differently: Take 0.5 mg by mouth every evening. 04/10/22 04/10/23  Tysinger, Camelia Eng, PA-C  Multiple Vitamins-Minerals (MEGA MULTIVITAMIN FOR MEN PO) Take 1 tablet by mouth daily.    [provider]  nystatin cream (MYCOSTATIN) Apply 1 Application topically as needed. 07/26/22   [provider]  OLANZapine (ZYPREXA) 5 MG tablet Take 1 tablet (5 mg total) by mouth at bedtime. Take one tablet nightly at 7 pm 09/08/22   Tysinger, Camelia Eng, PA-C  potassium chloride (KLOR-CON) 10 MEQ tablet TAKE 1 TABLET(10 MEQ) BY MOUTH TWICE DAILY 09/27/22   Tysinger, Camelia Eng, PA-C  valsartan (DIOVAN) 160 MG tablet Take 1 tablet (160 mg total) by mouth daily. 08/23/22   Nahser, Wonda Cheng, MD      Allergies  Patient has no known allergies.    Review of Systems   Review of Systems  Physical Exam Updated Vital Signs BP 127/74   Pulse 87   Temp 97.9 F (36.6 C) (Oral)   Resp 13   SpO2 94%  Physical Exam Vitals and nursing note reviewed.  Constitutional:      General: He is not in acute distress.    Appearance: He is well-developed. He is not ill-appearing.  HENT:     Head: Normocephalic and atraumatic.     Nose: Nose normal.     Mouth/Throat:     Mouth: Mucous membranes are moist.  Eyes:     Extraocular Movements: Extraocular movements intact.     Conjunctiva/sclera: Conjunctivae normal.     Pupils:  Pupils are equal, round, and reactive to light.  Cardiovascular:     Rate and Rhythm: Normal rate and regular rhythm.     Pulses: Normal pulses.     Heart sounds: Normal heart sounds. No murmur heard. Pulmonary:     Effort: Pulmonary effort is normal. No respiratory distress.     Breath sounds: Normal breath sounds.  Abdominal:     General: Abdomen is flat. There is no distension.     Palpations: Abdomen is soft.     Tenderness: There is no abdominal tenderness. There is no guarding.  Musculoskeletal:        General: Tenderness present. No swelling.     Cervical back: Normal range of motion and neck supple.     Comments: He does have some swelling and tenderness to the prepatellar space on the left knee, there is overlying scab in this area, he is able to flex and extend his knee without much issue, there is some warmth and redness in this area as well  Skin:    General: Skin is warm and dry.     Capillary Refill: Capillary refill takes less than 2 seconds.  Neurological:     General: No focal deficit present.     Mental Status: He is alert.  Psychiatric:        Mood and Affect: Mood normal.     ED Results / Procedures / Treatments   Labs (all labs ordered are listed, but only abnormal results are displayed) Labs Reviewed  LACTIC ACID, PLASMA - Abnormal; Notable for the following components:      Result Value   Lactic Acid, Venous 2.3 (*)    All other components within normal limits  COMPREHENSIVE METABOLIC PANEL - Abnormal; Notable for the following components:   Glucose, Bld 133 (*)    BUN 25 (*)    Creatinine, Ser 1.57 (*)    Albumin 2.7 (*)    Alkaline Phosphatase 143 (*)    Total Bilirubin 2.7 (*)    GFR, Estimated 45 (*)    All other components within normal limits  CBC WITH DIFFERENTIAL/PLATELET - Abnormal; Notable for the following components:   RBC 2.86 (*)    Hemoglobin 11.5 (*)    HCT 34.9 (*)    MCV 122.0 (*)    MCH 40.2 (*)    All other components within  normal limits  URINALYSIS, ROUTINE W REFLEX MICROSCOPIC - Abnormal; Notable for the following components:   Color, Urine AMBER (*)    Hgb urine dipstick SMALL (*)    Bilirubin Urine SMALL (*)    Protein, ur 30 (*)    All other components within normal limits  SEDIMENTATION RATE - Abnormal; Notable for the  following components:   Sed Rate 123 (*)    All other components within normal limits  C-REACTIVE PROTEIN - Abnormal; Notable for the following components:   CRP 8.6 (*)    All other components within normal limits  CULTURE, BLOOD (ROUTINE X 2)  RESP PANEL BY RT-PCR (RSV, FLU A&B, COVID)  RVPGX2  CULTURE, BLOOD (ROUTINE X 2)  URINE CULTURE  PROTIME-INR  APTT    EKG EKG Interpretation  Date/Time:  Saturday October 21 2022 15:08:13 EST Ventricular Rate:  92 PR Interval:  167 QRS Duration: 101 QT Interval:  404 QTC Calculation: 500 R Axis:   -13 Text Interpretation: Sinus rhythm LAE, consider biatrial enlargement LVH with secondary repolarization abnormality Confirmed by Lennice Sites ([id number]) on 10/21/2022 3:40:21 PM  Radiology CT HEAD WO CONTRAST (5MM)  Result Date: 10/21/2022 CLINICAL DATA:  Mental status change, unknown cause EXAM: CT HEAD WITHOUT CONTRAST TECHNIQUE: Contiguous axial images were obtained from the base of the skull through the vertex without intravenous contrast. RADIATION DOSE REDUCTION: This exam was performed according to the departmental dose-optimization program which includes automated exposure control, adjustment of the mA and/or kV according to patient size and/or use of iterative reconstruction technique. COMPARISON:  09/23/2022 FINDINGS: Brain: There is periventricular white matter decreased attenuation consistent with small vessel ischemic changes. Ventricles, sulci and cisterns are prominent consistent with age related involutional changes. No acute intracranial hemorrhage, mass effect or shift. No hydrocephalus. Vascular: No hyperdense vessel or unexpected  calcification. Skull: Normal. Negative for fracture or focal lesion. Sinuses/Orbits: Mucoperiosteal thickening noted consistent chronic maxillary ethmoid and frontal sinusitis. IMPRESSION: Atrophy and chronic small vessel ischemic changes. No acute intracranial process identified. Electronically Signed   By: Sammie Bench M.D.   On: 10/21/2022 17:18   DG Knee Complete 4 Views Left  Result Date: 10/21/2022 CLINICAL DATA:  Fever.  Left knee swelling. EXAM: LEFT KNEE - COMPLETE 4+ VIEW COMPARISON:  None Available. FINDINGS: No acute fracture or dislocation. Large joint effusion. Two large intra-articular bodies. Tricompartmental degenerative changes. Bone mineralization is normal. Soft tissues are unremarkable. IMPRESSION: 1. No acute osseous abnormality.  Large joint effusion. 2. Tricompartmental osteoarthritis with large intra-articular bodies. Electronically Signed   By: Titus Dubin M.D.   On: 10/21/2022 16:06   DG Chest Port 1 View  Result Date: 10/21/2022 CLINICAL DATA:  Swelling in left knee. Abdominal distension. Episodes of diarrhea. History of dementia. EXAM: PORTABLE CHEST 1 VIEW COMPARISON:  09/24/2022 FINDINGS: Unchanged cardiac enlargement. No pleural effusion or edema identified. No airspace opacities. Visualized osseous structures are unremarkable. IMPRESSION: 1. No acute findings. 2. Cardiac enlargement. Electronically Signed   By: Kerby Moors M.D.   On: 10/21/2022 16:01    Procedures Procedures    Medications Ordered in ED Medications  sodium chloride 0.9 % bolus 500 mL (500 mLs Intravenous New Bag/Given 10/21/22 1820)  doxycycline (VIBRA-TABS) tablet 100 mg (100 mg Oral Given 10/21/22 1820)    ED Course/ Medical Decision Making/ A&P                             Medical Decision Making Amount and/or Complexity of Data Reviewed Labs: ordered. Radiology: ordered. ECG/medicine tests: ordered.  Risk Prescription drug management.   James Bryant is here with weakness,  possibly fever.  He got Tylenol with EMS.  He has normal vitals here.  No fever.  History of dementia, hypertension.  May be some weakness confusion the last few  days per EMS per family.  He has no complaints.  He is able to walk with a walker he states.  He says he hit his left knee a few days ago.  He has a scab to his left prepatellar bursa space on the left.  He is got some tenderness and swelling and redness and warmth in this area.  He is able to flex and extend his knee without any issues bilaterally.  Differential diagnosis possibly infectious process, he could have an infected bursa on the left knee, could be viral process.  He hemodynamically appears stable.  His mental status seems pretty good.  Will try to talk with family.  Will pursue infectious workup with CBC, CMP, lactic acid, ESR, CRP, x-ray of the left knee, head CT, chest x-ray and urine studies.  Per my review and interpretation of labs there is no significant leukocytosis.  No evidence of urine infection.  CRP and ESR mildly elevated.  Lactic acid mildly elevated.  COVID and flu test are negative.  Electrolytes are at baseline.  No significant anemia.  Head CT per radiology report with no acute findings.  Chest x-ray per my review and interpretation with no evidence of pneumonia.  X-ray of the left knee shows effusion but no obvious bony abnormality.  There is significant arthritis.  I talked with Dr. Laurance Flatten with orthopedics on the phone.  Ultimately I do not think he has a septic joint he is able to flex and extend the knee without a lot of discomfort at all.  This appears to be mostly likely an infected bursa/super patellar swelling.  Ultimately will place patient in Ace wrap and start oral antibiotics and have very close follow-up with orthopedics for further management.  Patient discharged in good condition.  This chart was dictated using voice recognition software.  Despite best efforts to proofread,  errors can occur which can change  the documentation meaning.         Final Clinical Impression(s) / ED Diagnoses Final diagnoses:  Cellulitis, unspecified cellulitis site  Suprapatellar bursitis of left knee    Rx / DC Orders ED Discharge Orders          Ordered    Apply wrap       Comments: Left knee   10/21/22 1745    doxycycline (VIBRAMYCIN) 100 MG capsule  2 times daily        10/21/22 1932              Lennice Sites, DO 10/21/22 1935

## 2022-10-21 NOTE — ED Triage Notes (Signed)
Pt BIB [facility] from home for c/o fever. Pt family called home health today for a fever of 100.4. Pt has swelling to left knee, pt reports bumping it a few days ago. Distention to his abdomen and with episodes of diarrhea. Pt has hx of dementia, is usually able to stand but needed a lot of assistance today. Family reports pt has been lethargic and not acting himself for the past few days.   BP 132/74 HR 98 90% room air 100 % 3 L

## 2022-10-21 NOTE — ED Notes (Signed)
Got patient into a gown on the monitor patient is resting with call bell in reach

## 2022-10-21 NOTE — ED Notes (Signed)
Critical lactic of 2.3 communicated to [facility], DO at this time

## 2022-10-21 NOTE — Discharge Instructions (Signed)
Overall my suspicion is that you have an infected bursa sac to your left knee.  Follow-up with Dr. Laurance Flatten with orthopedics.  Call them Monday for follow-up appointment.  Recommend using Ace wrap to help with compression.  Take antibiotics as prescribed.  If you have any worsening symptoms or pain or other concerns please return for evaluation.  Take next dose antibiotic tomorrow morning.

## 2022-10-23 ENCOUNTER — Telehealth: Payer: Self-pay | Admitting: Medical

## 2022-10-23 LAB — URINE CULTURE: Culture: <10000 — AB

## 2022-10-23 NOTE — Telephone Encounter (Signed)
Left message for pt's wife to call me back

## 2022-10-23 NOTE — Telephone Encounter (Signed)
Spoke to daughter and pt has an appt tomorrow with ortho. He is at home and not in a facility

## 2022-10-23 NOTE — Telephone Encounter (Signed)
James Bryant called from [place], She says she seen James Bryant Saturday and he wasn't in good condition so he was sent to the hospital. She would like to fu with him and have orders approved for presumptive care once a week for 3 weeks. Provided call back number [phone].

## 2022-10-23 NOTE — Telephone Encounter (Signed)
Please call wife or daughter.  He was in the emergency department this weekend  I was curious on what the status is of things.  The last notations in the chart is that palliative care has been out.  When palliative care went out, it would appear he has been in a rehab facility and not at home.  He is seeing a skilled nursing facility now where they can have 24/7 care by nursing or what is the status?

## 2022-10-24 ENCOUNTER — Telehealth: Payer: Self-pay

## 2022-10-24 ENCOUNTER — Ambulatory Visit (INDEPENDENT_AMBULATORY_CARE_PROVIDER_SITE_OTHER): Payer: Medicare HMO | Admitting: Orthopedic Surgery

## 2022-10-24 DIAGNOSIS — R2242 Localized swelling, mass and lump, left lower limb: Secondary | ICD-10-CM | POA: Diagnosis not present

## 2022-10-24 NOTE — Telephone Encounter (Signed)
     Patient  visit on 10/21/2022  at [facility] was for fever, weakness.  Have you been able to follow up with your primary care physician? Patient followed up with Orthopedic physician 10/24/22.  The patient was or was not able to obtain any needed medicine or equipment. Patient was able to obtain medication.  Are there diet recommendations that you are having difficulty following? No  Patient expresses understanding of discharge instructions and education provided has no other needs at this time. Yes   [facility] Resource Care Guide   [email or web address]  ?? [id number]   Website: [email or web address]  [email or web address]

## 2022-10-24 NOTE — Progress Notes (Signed)
Orthopedic Surgery Progress Note   Assessment: Patient is a 77 y.o. male with left knee pain.  Has findings of osteo arthritis and has laxity with varus stress.  However, on his imaging he has an irregular calcification in the soft tissues superior to the joint at the lateral aspect of the distal femur.   Plan: -Recommended MRI with contrast to evaluate the irregular mass in his distal femur -Should complete his course of antibiotics as recommended by the ER -Weight bearing status: As tolerated -Over-the-counter medications for pain control -Follow-up in 4 weeks to go over the MRI.  X-rays at next visit: None  ___________________________________________________________________________  Subjective: Patient has had progressively worsening knee pain for the last 3 weeks or so.  He did have a fall when he was at a facility and landed on the knee but pain did not start right after that fall.  He was seen in the ER and diagnosed with cellulitis.  He does not remove any redness around the knee.  He says that his knee pain has gotten slightly better since starting an antibiotic.  His pain is located over the lateral aspect of the distal femur.  It is worse with weightbearing and improves with rest.  No paresthesias or numbness.  Has not noticed any swelling on the knee.  Past medical history: Congestive heart failure Chronic kidney disease Dementia Polycythemia vera  Physical Exam:  General: no acute distress, appears stated age Neurologic: alert, answering questions appropriately, following commands Respiratory: unlabored breathing on room air, symmetric chest rise Psychiatric: appropriate affect, normal cadence to speech  MSK:   -Left lower extremity  No tenderness to palpation over the joint line, negative McMurray, negative Lachman, no instability with posterior drawer test, no instability with valgus stress, instability with varus stress.  Varus alignment to the knee   No visible  erythema, no joint effusion  No pain through passive range of motion from 5 to 100 degrees  Tender to palpation over the distal lateral aspect of the femur.  No palpable mass EHL/TA/GSC intact Extensor mechanism intact Plantarflexes and dorsiflexes toes Sensation intact to light touch in sural, saphenous, tibial, deep peroneal, and superficial peroneal nerve distributions Foot warm and well perfused   Patient name: James Bryant Patient MRN: [id number] Date: 10/24/22

## 2022-10-25 ENCOUNTER — Other Ambulatory Visit: Payer: Self-pay | Admitting: Medical

## 2022-10-26 DIAGNOSIS — F40231 Fear of injections and transfusions: Secondary | ICD-10-CM | POA: Diagnosis not present

## 2022-10-26 DIAGNOSIS — D75839 Thrombocytosis, unspecified: Secondary | ICD-10-CM | POA: Diagnosis not present

## 2022-10-26 DIAGNOSIS — R32 Unspecified urinary incontinence: Secondary | ICD-10-CM | POA: Diagnosis not present

## 2022-10-26 DIAGNOSIS — I130 Hypertensive heart and chronic kidney disease with heart failure and stage 1 through stage 4 chronic kidney disease, or unspecified chronic kidney disease: Secondary | ICD-10-CM | POA: Diagnosis not present

## 2022-10-26 DIAGNOSIS — I509 Heart failure, unspecified: Secondary | ICD-10-CM | POA: Diagnosis not present

## 2022-10-26 DIAGNOSIS — I428 Other cardiomyopathies: Secondary | ICD-10-CM | POA: Diagnosis not present

## 2022-10-26 DIAGNOSIS — N1831 Chronic kidney disease, stage 3a: Secondary | ICD-10-CM | POA: Diagnosis not present

## 2022-10-26 DIAGNOSIS — D45 Polycythemia vera: Secondary | ICD-10-CM | POA: Diagnosis not present

## 2022-10-26 DIAGNOSIS — F0390 Unspecified dementia without behavioral disturbance: Secondary | ICD-10-CM | POA: Diagnosis not present

## 2022-10-26 LAB — CULTURE, BLOOD (ROUTINE X 2)
Culture: NO GROWTH
Culture: NO GROWTH
Special Requests: ADEQUATE
Special Requests: ADEQUATE

## 2022-10-26 NOTE — Telephone Encounter (Signed)
Spoke with [facility] Board and they are going to schedule pt for additional services

## 2022-10-27 ENCOUNTER — Telehealth: Payer: Self-pay | Admitting: Medical

## 2022-10-27 NOTE — Telephone Encounter (Signed)
[facility] is requesting verbal orders or home PT for 1 time a week for 5 weeks Sharyn Lull can be reached at [phone]

## 2022-10-27 NOTE — Telephone Encounter (Signed)
James Bryant was notified.

## 2022-10-30 ENCOUNTER — Other Ambulatory Visit: Payer: Medicare HMO

## 2022-10-30 ENCOUNTER — Telehealth: Payer: Self-pay | Admitting: Medical

## 2022-10-30 DIAGNOSIS — Z515 Encounter for palliative care: Secondary | ICD-10-CM

## 2022-10-30 NOTE — Progress Notes (Signed)
PATIENT NAME: James Bryant DOB: Apr 12, 1946 MRN: [id number]  PRIMARY CARE PROVIDER: Carlena Hurl, PA-C  RESPONSIBLE PARTY:  Acct ID - Guarantor Home Phone Work Phone Relationship Acct Type  [id number] JAKOBE, [phone]  Self P/F     [address], [place], [place] [address]    Palliative Care Initial Encounter Note    Completed initial visit.  I connected with Jeral Fruit regarding RHASHAD AXLEY on 10/30/22 by telephone and verified that I am speaking with the correct person using two identifiers.       HISTORY OF PRESENT ILLNESS:  77 y.o. male with memory loss, CHF, stage 3a CKD; recent hospitalization on 2.3.24, another ED visit on 3.2.24 and rehab and a Palliative visit on 2.28.24     Cognitive: continued memory loss; family has Dementia booklet from a previous visit   Appetite: he eats 2-3 meals and snacks   Mobility: awaiting weekly PT w/[facility]: LPN explained role and purpose of palliative care including visit frequency. Also discussed benefits of hospice care as well as the differences between the two. Daughter asked about getting briefs and supplies. LPN explained the difference between Palliative Care and Hospice with regard to supplies. Shavondra voiced understanding.      Goals of Care: Wife and daughter still want to keep the pt at home as long as possible with occasional respite stays  Next scheduled appt is for March 28th at 2pm in the home.        CODE STATUS: DNR ADVANCED DIRECTIVES: N MOST FORM: No PPS: 40%   PHYSICAL EXAM:   VITALS:There were no vitals filed for this visit.      Wayne Wicklund Georgann Housekeeper, LPN

## 2022-10-30 NOTE — Telephone Encounter (Signed)
James Bryant from [facility] called again, she is asking approval for orders for presumptive care once a week for 3 weeks. She is asking for approval to be faxed to [phone], if need to call her she can be reached at [phone].

## 2022-10-30 NOTE — Telephone Encounter (Signed)
Spoke with James Bryant and pt is needing verbal orders for skilled nursing.   Per shane ok to give

## 2022-11-01 ENCOUNTER — Telehealth: Payer: Self-pay

## 2022-11-01 NOTE — Telephone Encounter (Signed)
Notified [facility] of verbal orders

## 2022-11-01 NOTE — Telephone Encounter (Signed)
James Bryant from [facility] requesting verbal orders for start of care date for 3/16. Date was missed by one day and new orders are needed. She can be reached at [phone].

## 2022-11-02 ENCOUNTER — Ambulatory Visit (INDEPENDENT_AMBULATORY_CARE_PROVIDER_SITE_OTHER): Payer: Medicare HMO | Admitting: Podiatry

## 2022-11-02 DIAGNOSIS — L853 Xerosis cutis: Secondary | ICD-10-CM

## 2022-11-02 DIAGNOSIS — M79675 Pain in left toe(s): Secondary | ICD-10-CM

## 2022-11-02 DIAGNOSIS — M79674 Pain in right toe(s): Secondary | ICD-10-CM

## 2022-11-02 DIAGNOSIS — B351 Tinea unguium: Secondary | ICD-10-CM | POA: Diagnosis not present

## 2022-11-02 NOTE — Progress Notes (Signed)
  Subjective:  Patient ID: James Bryant, male    DOB: Jun 22, 1946,  MRN: [id number]  Chief Complaint  Patient presents with   Nail Problem    RFC    77 y.o. male presents with the above complaint. History confirmed with patient. Patient presenting with pain related to dystrophic thickened elongated nails. Patient is unable to trim own nails related to nail dystrophy and/or mobility issues. Patient does not have a history of T2DM. No calluses causing pain.  Objective:  Physical Exam: warm, good capillary refill nail exam onychomycosis of the toenails, onycholysis, and dystrophic nails DP pulses palpable, PT pulses palpable, and protective sensation intact Left Foot:  Pain with palpation of nails due to elongation and dystrophic growth.  Right Foot: Pain with palpation of nails due to elongation and dystrophic growth.   Assessment:   1. Pain due to onychomycosis of toenails of both feet   2. Xerosis of skin       Plan:  Patient was evaluated and treated and all questions answered.   #Onychomycosis with pain  -Nails palliatively debrided as below. -Educated on self-care  Procedure: Nail Debridement Rationale: Pain Type of Debridement: manual, sharp debridement. Instrumentation: Nail nipper, rotary burr. Number of Nails: 10  Return in about 3 months (around 02/02/2023) for RFC.         Everitt Amber, DPM [facility]

## 2022-11-05 DIAGNOSIS — G309 Alzheimer's disease, unspecified: Secondary | ICD-10-CM | POA: Diagnosis not present

## 2022-11-05 DIAGNOSIS — Z7982 Long term (current) use of aspirin: Secondary | ICD-10-CM | POA: Diagnosis not present

## 2022-11-05 DIAGNOSIS — I10 Essential (primary) hypertension: Secondary | ICD-10-CM | POA: Diagnosis not present

## 2022-11-05 DIAGNOSIS — F0280 Dementia in other diseases classified elsewhere without behavioral disturbance: Secondary | ICD-10-CM | POA: Diagnosis not present

## 2022-11-05 DIAGNOSIS — D649 Anemia, unspecified: Secondary | ICD-10-CM | POA: Diagnosis not present

## 2022-11-05 DIAGNOSIS — H409 Unspecified glaucoma: Secondary | ICD-10-CM | POA: Diagnosis not present

## 2022-11-05 DIAGNOSIS — Z993 Dependence on wheelchair: Secondary | ICD-10-CM | POA: Diagnosis not present

## 2022-11-05 DIAGNOSIS — N289 Disorder of kidney and ureter, unspecified: Secondary | ICD-10-CM | POA: Diagnosis not present

## 2022-11-05 DIAGNOSIS — F39 Unspecified mood [affective] disorder: Secondary | ICD-10-CM | POA: Diagnosis not present

## 2022-11-06 ENCOUNTER — Other Ambulatory Visit: Payer: Self-pay | Admitting: Physician Assistant

## 2022-11-06 DIAGNOSIS — Z993 Dependence on wheelchair: Secondary | ICD-10-CM | POA: Diagnosis not present

## 2022-11-06 DIAGNOSIS — G309 Alzheimer's disease, unspecified: Secondary | ICD-10-CM | POA: Diagnosis not present

## 2022-11-06 DIAGNOSIS — F0280 Dementia in other diseases classified elsewhere without behavioral disturbance: Secondary | ICD-10-CM | POA: Diagnosis not present

## 2022-11-06 DIAGNOSIS — N289 Disorder of kidney and ureter, unspecified: Secondary | ICD-10-CM | POA: Diagnosis not present

## 2022-11-06 DIAGNOSIS — D649 Anemia, unspecified: Secondary | ICD-10-CM | POA: Diagnosis not present

## 2022-11-06 DIAGNOSIS — F39 Unspecified mood [affective] disorder: Secondary | ICD-10-CM | POA: Diagnosis not present

## 2022-11-06 DIAGNOSIS — I10 Essential (primary) hypertension: Secondary | ICD-10-CM | POA: Diagnosis not present

## 2022-11-06 DIAGNOSIS — H409 Unspecified glaucoma: Secondary | ICD-10-CM | POA: Diagnosis not present

## 2022-11-06 DIAGNOSIS — Z7982 Long term (current) use of aspirin: Secondary | ICD-10-CM | POA: Diagnosis not present

## 2022-11-06 NOTE — Telephone Encounter (Signed)
Pam- verbal for restart PT 1 x 3 week 1 month x 1  2 PRN  Gave ok for orders

## 2022-11-07 ENCOUNTER — Telehealth: Payer: Self-pay | Admitting: Medical

## 2022-11-07 NOTE — Telephone Encounter (Signed)
Left detailed message for Junie Panning and gave verbal orders

## 2022-11-07 NOTE — Telephone Encounter (Signed)
Received a call from Pilar Plate with [facility] she is requesting PT for the following:  2 times a week for 3 weeks  1 time a week for 6 weeks  Please advise Erin at [phone].

## 2022-11-16 ENCOUNTER — Other Ambulatory Visit: Payer: Medicare HMO

## 2022-11-16 VITALS — BP 129/60 | HR 71 | Temp 98.0°F

## 2022-11-16 DIAGNOSIS — Z515 Encounter for palliative care: Secondary | ICD-10-CM

## 2022-11-16 NOTE — Progress Notes (Signed)
COMMUNITY PALLIATIVE CARE SW NOTE  PATIENT NAME: James Bryant DOB: [birth year] MRN: [id number]  PRIMARY CARE PROVIDER: Carlena Hurl, PA-C  RESPONSIBLE PARTY:  Acct ID - Guarantor Home Phone Work Phone Relationship Acct Type  [id number] YEREMI, [phone]  Self P/F     [address], [place], [place] [address]   Palliative Care Follow-up Visit/Clinical Social Work  PC SW and nurse-D. Georgann Housekeeper completed a visit with patient at his home post hospitalization and rehab at [facility]. He was sitting up on the couch awake, alert and appearing to be in good spirits. He denied pain. His wife [name] was present with him.  Patient was assessed for physical therapy by [facility]. He has an appointment for an MRI on his left knee following an infection in that knee. He is eating three meals a day. He is having no swallowing difficulties. Patient current weight is 154 lbs. as of today. She report that patient's weight has been fluctuating. The nurse provided education regarding what to do is patient is retaining fluid as evidenced by a 2-3 pound weight gain overnight. His wife weighs him daily. Patient denies any shortness of breath.  Patient is ambulating with a walker or rollator. His wife is assisting with personal care services.  Dementia booklet provided at previous visit. Wife advised she has looked through, but report no having time to really read over it. The wife was encouraged to read it for education and ask questions as needed.  No other concerns noted by patient's husband or patient.   Next appt: 12/08/22 @ 2pm.   Social History   Tobacco Use   Smoking status: Former   Smokeless tobacco: Never  Substance Use Topics   Alcohol use: No    Comment: stopped many years ago     CODE STATUS: Full Code ADVANCED DIRECTIVES: No MOST FORM COMPLETE:  Yes HOSPICE EDUCATION PROVIDED:No  Duration of visit and documentation: 54 minutes  [employer], LCSW

## 2022-11-16 NOTE — Progress Notes (Signed)
PATIENT NAME: James Bryant DOB: 1946-03-13 MRN: [id number]  PRIMARY CARE PROVIDER: Carlena Hurl, PA-C  RESPONSIBLE PARTY:  Acct ID - Guarantor Home Phone Work Phone Relationship Acct Type  [id number] ZIQUAN, [phone]  Self P/F     [address] [place], [place] [address]     Palliative Care Follow Up Encounter Note    Completed home visit with Katheren Puller, SW.   Wife Nida also present     HISTORY OF PRESENT ILLNESS:  77 y.o. male with memory loss, CHF, stage 3a CKD     Cognitive: memory loss; pt is alert and conversationally appropriate during this visit; discussed Dementia booklet and asked if there were questions; encouraged family to continue reading booklet for more extensive discussion/education at next visit   Appetite: wife reports pt is eating more than last visit, she leaves snack and fruit on the kitchen table; she tries to provide finger foods d/t decrease in dexterity; he eats 2-3 meals and snacks   Cardiac: no BLE edema noted; wife elevates pt's BLE when he is not in bed   Mobility: has PT w/[facility] 1-2 times per week; uses walker in the house and rollator for doctor appts and getting his snacks   ADLs; dependent on wife for assistance and cues  Sleeping Pattern: still gets up and down throughout the night d/t taking diuretics   GI/GU: has BM daily; no urinary issues   Respiratory: no issues/concerns at this time   Pain: pt denies pain at this time   Wt: pt's wt as of 09/20/22 is 178lbs; current wt is 154lbs     Goals of Care: Wife and daughter want to keep the pt at home as long as possible with occasional respite stays;       CODE STATUS: DNR ADVANCED DIRECTIVES: N MOST FORM: No PPS: 40%  PHYSICAL EXAM:   VITALS: Today's Vitals   11/16/22 1159  BP: 129/60  Pulse: 71  Temp: 98 F (36.7 C)  TempSrc: Temporal  SpO2: 94%  PainSc: 0-No pain    LUNGS: clear to auscultation  CARDIAC: Cor RRR EXTREMITIES: no  edema; AROM x4 SKIN: Skin color, texture, turgor normal. No rashes or lesions  NEURO: positive for memory problems and weakness   Next scheduled appt is 12/08/22 at [facility], LPN

## 2022-11-18 ENCOUNTER — Ambulatory Visit
Admission: RE | Admit: 2022-11-18 | Discharge: 2022-11-18 | Disposition: A | Discharge status: Home / Self Care | Payer: Medicare HMO | Source: Ambulatory Visit | Attending: Orthopedic Surgery | Admitting: Orthopedic Surgery

## 2022-11-18 ENCOUNTER — Other Ambulatory Visit: Payer: Self-pay | Admitting: Orthopedic Surgery

## 2022-11-18 DIAGNOSIS — M25562 Pain in left knee: Secondary | ICD-10-CM | POA: Diagnosis not present

## 2022-11-18 DIAGNOSIS — R2242 Localized swelling, mass and lump, left lower limb: Secondary | ICD-10-CM

## 2022-11-21 DIAGNOSIS — F0280 Dementia in other diseases classified elsewhere without behavioral disturbance: Secondary | ICD-10-CM | POA: Diagnosis not present

## 2022-11-21 DIAGNOSIS — F39 Unspecified mood [affective] disorder: Secondary | ICD-10-CM | POA: Diagnosis not present

## 2022-11-21 DIAGNOSIS — Z7982 Long term (current) use of aspirin: Secondary | ICD-10-CM | POA: Diagnosis not present

## 2022-11-21 DIAGNOSIS — D649 Anemia, unspecified: Secondary | ICD-10-CM | POA: Diagnosis not present

## 2022-11-21 DIAGNOSIS — Z993 Dependence on wheelchair: Secondary | ICD-10-CM | POA: Diagnosis not present

## 2022-11-21 DIAGNOSIS — N289 Disorder of kidney and ureter, unspecified: Secondary | ICD-10-CM | POA: Diagnosis not present

## 2022-11-21 DIAGNOSIS — G309 Alzheimer's disease, unspecified: Secondary | ICD-10-CM | POA: Diagnosis not present

## 2022-11-21 DIAGNOSIS — I10 Essential (primary) hypertension: Secondary | ICD-10-CM | POA: Diagnosis not present

## 2022-11-21 DIAGNOSIS — H409 Unspecified glaucoma: Secondary | ICD-10-CM | POA: Diagnosis not present

## 2022-11-22 ENCOUNTER — Ambulatory Visit: Payer: Medicare HMO | Admitting: Orthopedic Surgery

## 2022-11-22 DIAGNOSIS — M1712 Unilateral primary osteoarthritis, left knee: Secondary | ICD-10-CM

## 2022-11-22 NOTE — Progress Notes (Signed)
Orthopedic Surgery Progress Note     Assessment: Patient is a 77 y.o. male with left knee pain.  Has findings of osteoarthritis and has laxity with varus stress. Calcifications consistent with loose bodies     Plan: -Discussed his options at this point.  He has bad arthritis with pain in the knee.  I discussed Tylenol, ibuprofen, injection, knee replacement.  I recommended an injection but patient is afraid of needles and did not want to proceed with that treatment.  He does not feel that his pain is severe enough to warrant a knee replacement at this time.  So, he can continue with over-the-counter medications as needed for pain relief.  Will see him back on an as-needed basis.  If he is seriously considering joint replacement, will refer him to one of my partners   ___________________________________________________________________________   Subjective: Patient's knee pain is improved since the last time I saw him.  He is now ambulating better with a walker.  He is able to walk longer distances.  He is taking occasional arthritis strength Tylenol for pain relief.  He has not noticed any swelling on the knee.  He has not given way recently.  Denies paresthesias and numbness.   Past medical history: Congestive heart failure Chronic kidney disease Dementia Polycythemia vera   Physical Exam:   General: no acute distress, appears stated age Neurologic: alert, answering questions appropriately, following commands Respiratory: unlabored breathing on room air, symmetric chest rise Psychiatric: appropriate affect, normal cadence to speech   MSK:    -Left lower extremity             No tenderness to palpation over the joint line, negative McMurray, negative Lachman, no instability with posterior drawer test, no instability with valgus stress, instability with varus stress.  Varus alignment to the knee               No pain through passive range of motion from 5 to 100 degrees, no joint  effusion EHL/TA/GSC intact Plantarflexes and dorsiflexes toes Sensation intact to light touch in sural, saphenous, tibial, deep peroneal, and superficial peroneal nerve distributions Foot warm and well perfused  MRI of the left knee from 11/18/2022 was independently reviewed and interpreted showing degenerative changes within all 3 compartments.  No significant degenerative changes were seen within the medial compartment.  Has a degenerative medial meniscus tear.  There are several loose bodies within the articular space.    Patient name: James Bryant Patient MRN: [id number] Date: 11/22/22

## 2022-11-23 ENCOUNTER — Other Ambulatory Visit: Payer: Self-pay | Admitting: Medical

## 2022-11-23 ENCOUNTER — Ambulatory Visit: Payer: Medicare HMO | Admitting: Orthopedic Surgery

## 2022-11-23 DIAGNOSIS — N289 Disorder of kidney and ureter, unspecified: Secondary | ICD-10-CM | POA: Diagnosis not present

## 2022-11-23 DIAGNOSIS — F39 Unspecified mood [affective] disorder: Secondary | ICD-10-CM | POA: Diagnosis not present

## 2022-11-23 DIAGNOSIS — F0280 Dementia in other diseases classified elsewhere without behavioral disturbance: Secondary | ICD-10-CM | POA: Diagnosis not present

## 2022-11-23 DIAGNOSIS — Z7982 Long term (current) use of aspirin: Secondary | ICD-10-CM | POA: Diagnosis not present

## 2022-11-23 DIAGNOSIS — D649 Anemia, unspecified: Secondary | ICD-10-CM | POA: Diagnosis not present

## 2022-11-23 DIAGNOSIS — I10 Essential (primary) hypertension: Secondary | ICD-10-CM | POA: Diagnosis not present

## 2022-11-23 DIAGNOSIS — H409 Unspecified glaucoma: Secondary | ICD-10-CM | POA: Diagnosis not present

## 2022-11-23 DIAGNOSIS — G309 Alzheimer's disease, unspecified: Secondary | ICD-10-CM | POA: Diagnosis not present

## 2022-11-23 DIAGNOSIS — Z993 Dependence on wheelchair: Secondary | ICD-10-CM | POA: Diagnosis not present

## 2022-11-23 MED ORDER — METOPROLOL SUCCINATE ER 25 MG PO TB24: 25.0000 mg | ORAL_TABLET | Freq: Every day | ORAL | 0 refills | Status: DC

## 2022-11-23 NOTE — Telephone Encounter (Signed)
I received a drug interaction notice conflict between carvedilol Coreg and asthma medications.  Lets have him stop carvedilol which affect the lungs and the heart and change to Toprol-XL 25 mg daily that does not have an impact on the lungs.  They will need to be monitoring her blood pressures and pulse over the next month to determine if we need to go up to the 50 mg Toprol or not.

## 2022-11-23 NOTE — Telephone Encounter (Signed)
Left message for pt to call me back 

## 2022-11-24 DIAGNOSIS — D649 Anemia, unspecified: Secondary | ICD-10-CM | POA: Diagnosis not present

## 2022-11-24 DIAGNOSIS — Z993 Dependence on wheelchair: Secondary | ICD-10-CM | POA: Diagnosis not present

## 2022-11-24 DIAGNOSIS — G309 Alzheimer's disease, unspecified: Secondary | ICD-10-CM | POA: Diagnosis not present

## 2022-11-24 DIAGNOSIS — F0280 Dementia in other diseases classified elsewhere without behavioral disturbance: Secondary | ICD-10-CM | POA: Diagnosis not present

## 2022-11-24 DIAGNOSIS — I10 Essential (primary) hypertension: Secondary | ICD-10-CM | POA: Diagnosis not present

## 2022-11-24 DIAGNOSIS — F39 Unspecified mood [affective] disorder: Secondary | ICD-10-CM | POA: Diagnosis not present

## 2022-11-24 DIAGNOSIS — N289 Disorder of kidney and ureter, unspecified: Secondary | ICD-10-CM | POA: Diagnosis not present

## 2022-11-24 DIAGNOSIS — Z7982 Long term (current) use of aspirin: Secondary | ICD-10-CM | POA: Diagnosis not present

## 2022-11-24 DIAGNOSIS — H409 Unspecified glaucoma: Secondary | ICD-10-CM | POA: Diagnosis not present

## 2022-11-24 NOTE — Telephone Encounter (Signed)
Pt's wife was notified  

## 2022-11-27 DIAGNOSIS — G309 Alzheimer's disease, unspecified: Secondary | ICD-10-CM | POA: Diagnosis not present

## 2022-11-27 DIAGNOSIS — Z993 Dependence on wheelchair: Secondary | ICD-10-CM | POA: Diagnosis not present

## 2022-11-27 DIAGNOSIS — F0280 Dementia in other diseases classified elsewhere without behavioral disturbance: Secondary | ICD-10-CM | POA: Diagnosis not present

## 2022-11-27 DIAGNOSIS — D649 Anemia, unspecified: Secondary | ICD-10-CM | POA: Diagnosis not present

## 2022-11-27 DIAGNOSIS — I10 Essential (primary) hypertension: Secondary | ICD-10-CM | POA: Diagnosis not present

## 2022-11-27 DIAGNOSIS — H409 Unspecified glaucoma: Secondary | ICD-10-CM | POA: Diagnosis not present

## 2022-11-27 DIAGNOSIS — F39 Unspecified mood [affective] disorder: Secondary | ICD-10-CM | POA: Diagnosis not present

## 2022-11-27 DIAGNOSIS — N289 Disorder of kidney and ureter, unspecified: Secondary | ICD-10-CM | POA: Diagnosis not present

## 2022-11-27 DIAGNOSIS — Z7982 Long term (current) use of aspirin: Secondary | ICD-10-CM | POA: Diagnosis not present

## 2022-12-02 NOTE — Progress Notes (Signed)
ok 

## 2022-12-04 DIAGNOSIS — F0280 Dementia in other diseases classified elsewhere without behavioral disturbance: Secondary | ICD-10-CM | POA: Diagnosis not present

## 2022-12-04 DIAGNOSIS — N289 Disorder of kidney and ureter, unspecified: Secondary | ICD-10-CM | POA: Diagnosis not present

## 2022-12-04 DIAGNOSIS — Z993 Dependence on wheelchair: Secondary | ICD-10-CM | POA: Diagnosis not present

## 2022-12-04 DIAGNOSIS — I10 Essential (primary) hypertension: Secondary | ICD-10-CM | POA: Diagnosis not present

## 2022-12-04 DIAGNOSIS — D649 Anemia, unspecified: Secondary | ICD-10-CM | POA: Diagnosis not present

## 2022-12-04 DIAGNOSIS — H409 Unspecified glaucoma: Secondary | ICD-10-CM | POA: Diagnosis not present

## 2022-12-04 DIAGNOSIS — G309 Alzheimer's disease, unspecified: Secondary | ICD-10-CM | POA: Diagnosis not present

## 2022-12-04 DIAGNOSIS — Z7982 Long term (current) use of aspirin: Secondary | ICD-10-CM | POA: Diagnosis not present

## 2022-12-04 DIAGNOSIS — F39 Unspecified mood [affective] disorder: Secondary | ICD-10-CM | POA: Diagnosis not present

## 2022-12-05 DIAGNOSIS — H409 Unspecified glaucoma: Secondary | ICD-10-CM | POA: Diagnosis not present

## 2022-12-05 DIAGNOSIS — G309 Alzheimer's disease, unspecified: Secondary | ICD-10-CM | POA: Diagnosis not present

## 2022-12-05 DIAGNOSIS — I10 Essential (primary) hypertension: Secondary | ICD-10-CM | POA: Diagnosis not present

## 2022-12-05 DIAGNOSIS — F0280 Dementia in other diseases classified elsewhere without behavioral disturbance: Secondary | ICD-10-CM | POA: Diagnosis not present

## 2022-12-05 DIAGNOSIS — F39 Unspecified mood [affective] disorder: Secondary | ICD-10-CM | POA: Diagnosis not present

## 2022-12-05 DIAGNOSIS — N289 Disorder of kidney and ureter, unspecified: Secondary | ICD-10-CM | POA: Diagnosis not present

## 2022-12-05 DIAGNOSIS — Z993 Dependence on wheelchair: Secondary | ICD-10-CM | POA: Diagnosis not present

## 2022-12-05 DIAGNOSIS — D649 Anemia, unspecified: Secondary | ICD-10-CM | POA: Diagnosis not present

## 2022-12-05 DIAGNOSIS — Z7982 Long term (current) use of aspirin: Secondary | ICD-10-CM | POA: Diagnosis not present

## 2022-12-07 ENCOUNTER — Telehealth: Payer: Self-pay | Admitting: Medical

## 2022-12-07 NOTE — Telephone Encounter (Signed)
Pt's daughter called and states that concerning his potassium, his cardiologist has written for him to take 1 a day and his current refill with [facility] says twice a day. She wants to double check the dose and make sure he is taking the correct dose. Please advise dose. Please call [phone] and you may leave a message.

## 2022-12-07 NOTE — Telephone Encounter (Signed)
Called pt to schedule med check appt. Left message to return call.

## 2022-12-08 ENCOUNTER — Other Ambulatory Visit: Payer: Medicare HMO

## 2022-12-08 VITALS — BP 122/68 | HR 75 | Temp 97.5°F

## 2022-12-08 DIAGNOSIS — Z515 Encounter for palliative care: Secondary | ICD-10-CM

## 2022-12-08 NOTE — Progress Notes (Signed)
PATIENT NAME: James Bryant DOB: 10/02/1945 MRN: [id number]  PRIMARY CARE PROVIDER: Jac Canavan, PA-C  RESPONSIBLE PARTY:  Acct ID - Guarantor Home Phone Work Phone Relationship Acct Type  [id number] James Bryant* [phone]  Self P/F     [address] [place], [place] [address]    Palliative Care Follow Up Encounter Note    Completed home visit. Wife James Bryant also present     HISTORY OF PRESENT ILLNESS:  77 y.o. male with memory loss, CHF, stage 3a CKD     Cognitive: pt is alert and conversationally appropriate during this visit; discussed Dementia booklet and discussed sundowning; no signs of this yet but wife reports she keeps an eye out for things out of the ordinary   Appetite: continues to eats 2-3 meals and snacks   Cardiac: no BLE edema noted; wife has elevated pt's feet; pt is wearing compression socks   Mobility: has PT w/[facility]; alternates 1-2 times per week; uses walker in the house and rollator for doctor appts and getting his snacks; wife reports pt "gets a good workout when they come"   ADLs; dependent on wife for assistance and cues   Sleeping Pattern: still gets up and down 2-3 times throughout the night d/t taking diuretics; pt is able to go back to sleep    GI/GU: has BM daily; no urinary issues   Pain: pt denies pain at this time   Wt: pt's current wt is 154lbs     Goals of Care: Wife and daughter want to keep the pt at home as long as possible with occasional respite stays    CODE STATUS: DNR ADVANCED DIRECTIVES: N MOST FORM: Y PPS: 40%   PHYSICAL EXAM:   VITALS: Today's Vitals   12/08/22 1449  BP: 122/68  Pulse: 75  Temp: (!) 97.5 F (36.4 C)  TempSrc: Temporal  SpO2: 97%  PainSc: 0-No pain    LUNGS: clear to auscultation , diminished breath sounds CARDIAC: Cor RRR EXTREMITIES: AROM x4 with limited ROM SKIN: cool, dry, intact NEURO: negative except for coordination problems, memory problems, and  weakness       James Bryant Clementeen Graham, LPN

## 2022-12-10 ENCOUNTER — Other Ambulatory Visit: Payer: Self-pay | Admitting: Medical

## 2022-12-11 ENCOUNTER — Telehealth (INDEPENDENT_AMBULATORY_CARE_PROVIDER_SITE_OTHER): Payer: Medicare HMO | Admitting: Medical

## 2022-12-11 ENCOUNTER — Encounter: Payer: Self-pay | Admitting: Medical

## 2022-12-11 VITALS — BP 139/70 | Wt 154.0 lb

## 2022-12-11 DIAGNOSIS — D75839 Thrombocytosis, unspecified: Secondary | ICD-10-CM

## 2022-12-11 DIAGNOSIS — F40231 Fear of injections and transfusions: Secondary | ICD-10-CM

## 2022-12-11 DIAGNOSIS — E611 Iron deficiency: Secondary | ICD-10-CM | POA: Diagnosis not present

## 2022-12-11 DIAGNOSIS — F03C0 Unspecified dementia, severe, without behavioral disturbance, psychotic disturbance, mood disturbance, and anxiety: Secondary | ICD-10-CM | POA: Diagnosis not present

## 2022-12-11 DIAGNOSIS — Z789 Other specified health status: Secondary | ICD-10-CM

## 2022-12-11 DIAGNOSIS — M1710 Unilateral primary osteoarthritis, unspecified knee: Secondary | ICD-10-CM

## 2022-12-11 DIAGNOSIS — E559 Vitamin D deficiency, unspecified: Secondary | ICD-10-CM

## 2022-12-11 DIAGNOSIS — D45 Polycythemia vera: Secondary | ICD-10-CM

## 2022-12-11 DIAGNOSIS — Z9181 History of falling: Secondary | ICD-10-CM

## 2022-12-11 DIAGNOSIS — R278 Other lack of coordination: Secondary | ICD-10-CM

## 2022-12-11 DIAGNOSIS — I10 Essential (primary) hypertension: Secondary | ICD-10-CM

## 2022-12-11 DIAGNOSIS — Z7409 Other reduced mobility: Secondary | ICD-10-CM

## 2022-12-11 DIAGNOSIS — Z515 Encounter for palliative care: Secondary | ICD-10-CM | POA: Diagnosis not present

## 2022-12-11 DIAGNOSIS — I509 Heart failure, unspecified: Secondary | ICD-10-CM

## 2022-12-11 DIAGNOSIS — N1831 Chronic kidney disease, stage 3a: Secondary | ICD-10-CM

## 2022-12-11 MED ORDER — FUROSEMIDE 80 MG PO TABS: ORAL_TABLET | ORAL | 1 refills | Status: DC

## 2022-12-11 MED ORDER — METOPROLOL SUCCINATE ER 25 MG PO TB24: 25.0000 mg | ORAL_TABLET | Freq: Every day | ORAL | 0 refills | Status: DC

## 2022-12-11 NOTE — Progress Notes (Signed)
This visit type was conducted due to national recommendations for restrictions regarding the COVID-19 Pandemic (e.g. social distancing) in an effort to limit this patient's exposure and mitigate transmission in our community.  Due to their co-morbid illnesses, this patient is at least at moderate risk for complications without adequate follow up.  This format is felt to be most appropriate for this patient at this time.    Documentation for virtual audio and video telecommunications through [facility] encounter:  The patient was located at home. The provider was located in the office. The patient did consent to this visit and is aware of possible charges through their insurance for this visit.  The other persons participating in this telemedicine service were none. Time spent on call was 25 minutes and in review of previous records >30 minutes total.  This virtual service is not related to other E/M service within previous 7 days.    Subjective:  James Bryant is a 77 y.o. male who presents for Chief Complaint  Patient presents with   med check    Med check, no concerns     Accompanied by his wife [name] and daughter [name] in the consult today.  Medical team: Here for primary care Palliative Care Dr. Willia Craze, orthopedics Dr. Carlena Hurl, podiatry Dr. Kristeen Miss, Jacolyn Reedy, PA, and Eligha Bridegroom, NP, cardiology Prior with Dr. Ahmed Prima and Dr. Eli Hose, oncology  Here for med check follow-up.  His medical history includes memory loss/dementia, CHF, stage IIIa CKD, polycythemia vera, thrombocytosis, impaired mobility and impaired ADLs, severe needle phobia, hypertension, iron deficiency, vitamin D deficiency.  No recent SOB or edema.  Currently using Lasix  twice daily, and lately using potassium once daily.  Compliant with Toprol XL  and Valsartan  daily.    BPs lately normal.   122/68, 110/60, 116/72.  Breathing ok lately, better  than earlier in the year.   Has cardiology f/u appt in May soon.  Last visit 10/2021.  He has been seeing palliative care recently.  Palliative care meets with them once a month.  He eats 2 or 3 meals a day, no recent swelling reported, does wear compression socks.  He has been doing physical therapy through [facility], 1-2 times per week.  He is a walker in the house and a rollator for appointments.  No recent falls.    ADLs are dependent upon wife for assistance and cues.  Wife and daughter want to keep him at home as long as possible, with occasional  respite days if needed  He has been out of the house going out with family some of recent  Activity overall improved.  He seems more upbeat and awake.   No other aggravating or relieving factors.    No other c/o.  Past Medical History:  Diagnosis Date   Elevated liver function tests 01/17/2019   Elevated serum creatinine 01/17/2019   Fear of needles 01/17/2019   Former smoker, stopped smoking many years ago 01/17/2019   Hypertension    Polycythemia 01/17/2019   Thrombocytosis 01/17/2019   Current Outpatient Medications on File Prior to Visit  Medication Sig Dispense Refill   albuterol (PROVENTIL) (2.5 MG/3ML) 0.083% nebulizer solution Take 3 mLs (2.5 mg total) by nebulization every 6 (six) hours as needed for wheezing or shortness of breath. 150 mL 1   ASPIRIN LOW DOSE 81 MG tablet TAKE 1 TABLET EVERY DAY 90 tablet 3   brimonidine (ALPHAGAN) 0.2 % ophthalmic solution SMARTSIG:In  Eye(s)     donepezil (ARICEPT) 5 MG tablet TAKE 1 TABLET AT BEDTIME 90 tablet 1   ferrous gluconate (FERGON) 324 MG tablet Take 1 tablet (324 mg total) by mouth daily with breakfast. 90 tablet 1   hydroxyurea (HYDREA) 500 MG capsule TAKE 1 CAPSULE (500 MG TOTAL) BY MOUTH 2 (TWO) TIMES DAILY. MAY TAKE WITH FOOD TO MINIMIZE GI SIDE EFFECTS. 180 capsule 3   LORazepam (ATIVAN) 0.5 MG tablet Take 1 tablet (0.5 mg total) by mouth 2 (two) times daily.  (Patient taking differently: Take 0.5 mg by mouth every evening.) 5 tablet 0   Multiple Vitamins-Minerals (MEGA MULTIVITAMIN FOR MEN PO) Take 1 tablet by mouth daily.     nystatin cream (MYCOSTATIN) Apply 1 Application topically as needed.     OLANZapine (ZYPREXA) 5 MG tablet TAKE 1 TABLET BY MOUTH NIGHTLY AT 7 PM 90 tablet 0   potassium chloride (KLOR-CON) 10 MEQ tablet TAKE 1 TABLET(10 MEQ) BY MOUTH TWICE DAILY 60 tablet 5   valsartan (DIOVAN) 160 MG tablet Take 1 tablet (160 mg total) by mouth daily. 90 tablet 0   No current facility-administered medications on file prior to visit.     The following portions of the patient's history were reviewed and updated as appropriate: allergies, current medications, past family history, past medical history, past social history, past surgical history and problem list.  ROS Otherwise as in subjective above     Objective: BP 139/70   Wt 154 lb (69.9 kg)   BMI 24.12 kg/m   Wt Readings from Last 3 Encounters:  12/11/22 154 lb (69.9 kg)  10/18/22 143 lb (64.9 kg)  09/23/22 185 lb 3 oz (84 kg)    Not examined in person visit as a virtual consult.  He did talk and respond to some questions.  3 months   Assessment: Encounter Diagnoses  Name Primary?   Severe dementia, unspecified dementia type, unspecified whether behavioral, psychotic, or mood disturbance or anxiety Yes   Palliative care patient    Thrombocytosis    Stage 3a chronic kidney disease    At risk for falling    Coordination abnormal    Decreased activities of daily living (ADL)    Iron deficiency    Mobility impaired    Polycythemia vera    Severe needle phobia    Vitamin D deficiency    Congestive heart failure, NYHA class 2, unspecified congestive heart failure type    Primary hypertension    Arthritis of knee      Plan: Dementia-on Aricept, sees neurology soon follow-up  Palliative care patient In the last few months he has been utilizing palliative care  team He notes his shortness of breath and swelling CHF symptoms resolved a few months ago, currently doing okay on therapy I reviewed his recent palliative care note Activity has been improved and family is pleased with how things are going at the moment  CKD 3A-I reviewed February and March 2024 labs, stable  Iron deficiency-he continues on iron daily  Hypertension, history of CHF Continue Toprol-XL 25 mg daily Continue Lasix 80 mg twice daily along with potassium but make sure you are doing the potassium twice daily as well Follow-up in early May with cardiology as planned  I reviewed his recent orthopedic consult notes from 11/21/2022 regarding knee pain arthritis.  They discussed variety of treatments.  He declined injection of steroid.  He will continue over-the-counter analgesics for now and Ortho will see him back as needed.  There was some talk about in the future he may need joint replacement  See neurology this week    Najib was seen today for med check.  Diagnoses and all orders for this visit:  Severe dementia, unspecified dementia type, unspecified whether behavioral, psychotic, or mood disturbance or anxiety  Palliative care patient  Thrombocytosis  Stage 3a chronic kidney disease  At risk for falling  Coordination abnormal  Decreased activities of daily living (ADL)  Iron deficiency  Mobility impaired  Polycythemia vera  Severe needle phobia  Vitamin D deficiency  Congestive heart failure, NYHA class 2, unspecified congestive heart failure type  Primary hypertension  Arthritis of knee  Other orders -     metoprolol succinate (TOPROL XL) 25 MG 24 hr tablet; Take 1 tablet (25 mg total) by mouth daily. -     furosemide (LASIX) 80 MG tablet; TAKE 1 TABLET(80 MG) BY MOUTH TWICE DAILY    Follow up: 3 months

## 2022-12-11 NOTE — Telephone Encounter (Signed)
Pt has been scheduled.  °

## 2022-12-12 ENCOUNTER — Telehealth: Payer: Medicare HMO | Admitting: Medical

## 2022-12-12 DIAGNOSIS — D649 Anemia, unspecified: Secondary | ICD-10-CM | POA: Diagnosis not present

## 2022-12-12 DIAGNOSIS — I10 Essential (primary) hypertension: Secondary | ICD-10-CM | POA: Diagnosis not present

## 2022-12-12 DIAGNOSIS — N289 Disorder of kidney and ureter, unspecified: Secondary | ICD-10-CM | POA: Diagnosis not present

## 2022-12-12 DIAGNOSIS — Z993 Dependence on wheelchair: Secondary | ICD-10-CM | POA: Diagnosis not present

## 2022-12-12 DIAGNOSIS — H409 Unspecified glaucoma: Secondary | ICD-10-CM | POA: Diagnosis not present

## 2022-12-12 DIAGNOSIS — F0280 Dementia in other diseases classified elsewhere without behavioral disturbance: Secondary | ICD-10-CM | POA: Diagnosis not present

## 2022-12-12 DIAGNOSIS — Z7982 Long term (current) use of aspirin: Secondary | ICD-10-CM | POA: Diagnosis not present

## 2022-12-12 DIAGNOSIS — G309 Alzheimer's disease, unspecified: Secondary | ICD-10-CM | POA: Diagnosis not present

## 2022-12-12 DIAGNOSIS — F39 Unspecified mood [affective] disorder: Secondary | ICD-10-CM | POA: Diagnosis not present

## 2022-12-14 ENCOUNTER — Ambulatory Visit: Payer: Medicare HMO | Admitting: Psychiatry

## 2022-12-14 ENCOUNTER — Ambulatory Visit: Payer: Medicare HMO | Admitting: Adult Health

## 2022-12-14 ENCOUNTER — Encounter: Payer: Self-pay | Admitting: Adult Health

## 2022-12-14 VITALS — BP 120/54 | HR 96 | Ht 67.0 in | Wt 159.0 lb

## 2022-12-14 DIAGNOSIS — R413 Other amnesia: Secondary | ICD-10-CM

## 2022-12-14 MED ORDER — DONEPEZIL HCL 10 MG PO TABS: 10.0000 mg | ORAL_TABLET | Freq: Every day | ORAL | 11 refills | Status: DC

## 2022-12-14 NOTE — Patient Instructions (Signed)
Your Plan:  Increase Aricept to 10 mg at bedtime If your symptoms worsen or you develop new symptoms please let us know.   Thank you for coming to see us at [facility]. I hope we have been able to provide you high quality care today.  You may receive a patient satisfaction survey over the next few weeks. We would appreciate your feedback and comments so that we may continue to improve ourselves and the health of our patients.  

## 2022-12-14 NOTE — Progress Notes (Signed)
PATIENT: James Bryant DOB: 1946-01-22  REASON FOR VISIT: follow up HISTORY FROM: patient PRIMARY NEUROLOGIST: Dr. Delena Bali  Chief Complaint  Patient presents with   Follow-up    Pt in 4 with wife Pt here for memory Pt and wife states memory the same since last visit  Pt was admitted in hospital in Jan 2024 due to fluid overload      HISTORY OF PRESENT ILLNESS: Today 12/14/22  James Bryant is a 77 y.o. male who has been followed in this office for Alzhemier's disease . Returns today for follow-up. Feels that memory is better. Wife helps with ADLS- needs help with bathing. She manages medications, appointments and fiances. Admitted to hosptial for fluid overload. Since rehab feels that memory is better.  Has remained on Aricept 5 mg daily  HISTORY The patient presents for evaluation of memory loss which he began to notice in April 2023, though per family he has become less active in the past year. Prior to that he would go outside and visit his family frequently. She noticed bills were not being paid on time as far back as last fall. He will get disoriented about the date or location. Will not remember familiar places he has been to before. Will think cars he used to have are outside even though he has not owned them for several years. Has said nonsensical things, for example stated he owned an inflatable car.   TBI:  No past history of TBI Stroke:  no past history of stroke Seizures:  no past history of seizures Sleep: He has been sleeping more than normal. He snores at night Mood: wife notices he has been more withdrawn. Will be more anxious when wife is gone. He is more irritable lately. More disinhibition, will remove clothes when other people are in the room   Functional status:  Patient lives with wife who is his caregiver. Family is looking into memory care Cooking: has meals prepared for him Shopping: unable to shop on his own Bathing: Wife helps with hygiene   Toileting: urinary incontinence, wears adult diapers Driving: patient does not drive Bills: wife handles finances now after noticing bills were not being paid on time Medications: wife manages medications Forgetting loved ones names?: yes Word finding difficulty? yes  REVIEW OF SYSTEMS: Out of a complete 14 system review of symptoms, the patient complains only of the following symptoms, and all other reviewed systems are negative.  ALLERGIES: No Known Allergies  HOME MEDICATIONS: Outpatient Medications Prior to Visit  Medication Sig Dispense Refill   albuterol (PROVENTIL) (2.5 MG/3ML) 0.083% nebulizer solution Take 3 mLs (2.5 mg total) by nebulization every 6 (six) hours as needed for wheezing or shortness of breath. 150 mL 1   ASPIRIN LOW DOSE 81 MG tablet TAKE 1 TABLET EVERY DAY 90 tablet 3   brimonidine (ALPHAGAN) 0.2 % ophthalmic solution SMARTSIG:In Eye(s)     donepezil (ARICEPT) 5 MG tablet TAKE 1 TABLET AT BEDTIME 90 tablet 1   ferrous gluconate (FERGON) 324 MG tablet Take 1 tablet (324 mg total) by mouth daily with breakfast. 90 tablet 1   furosemide (LASIX) 80 MG tablet TAKE 1 TABLET(80 MG) BY MOUTH TWICE DAILY 180 tablet 1   hydroxyurea (HYDREA) 500 MG capsule TAKE 1 CAPSULE (500 MG TOTAL) BY MOUTH 2 (TWO) TIMES DAILY. MAY TAKE WITH FOOD TO MINIMIZE GI SIDE EFFECTS. 180 capsule 3   LORazepam (ATIVAN) 0.5 MG tablet Take 1 tablet (0.5 mg total) by mouth  2 (two) times daily. (Patient taking differently: Take 0.5 mg by mouth every evening.) 5 tablet 0   metoprolol succinate (TOPROL XL) 25 MG 24 hr tablet Take 1 tablet (25 mg total) by mouth daily. 90 tablet 0   Multiple Vitamins-Minerals (MEGA MULTIVITAMIN FOR MEN PO) Take 1 tablet by mouth daily.     nystatin cream (MYCOSTATIN) Apply 1 Application topically as needed.     OLANZapine (ZYPREXA) 5 MG tablet TAKE 1 TABLET BY MOUTH NIGHTLY AT 7 PM 90 tablet 0   potassium chloride (KLOR-CON) 10 MEQ tablet TAKE 1 TABLET(10 MEQ) BY MOUTH  TWICE DAILY 60 tablet 5   valsartan (DIOVAN) 160 MG tablet Take 1 tablet (160 mg total) by mouth daily. 90 tablet 0   No facility-administered medications prior to visit.    PAST MEDICAL HISTORY: Past Medical History:  Diagnosis Date   Elevated liver function tests 01/17/2019   Elevated serum creatinine 01/17/2019   Fear of needles 01/17/2019   Former smoker, stopped smoking many years ago 01/17/2019   Hypertension    Polycythemia 01/17/2019   Thrombocytosis 01/17/2019    PAST SURGICAL HISTORY: History reviewed. No pertinent surgical history.  FAMILY HISTORY: Family History  Problem Relation Age of Onset   Heart disease Mother    Hypertension Mother    Alzheimer's disease Neg Hx    Dementia Neg Hx     SOCIAL HISTORY: Social History   Socioeconomic History   Marital status: Married    Spouse name: Not on file   Number of children: Not on file   Years of education: Not on file   Highest education level: Not on file  Occupational History   Not on file  Tobacco Use   Smoking status: Former   Smokeless tobacco: Never  Vaping Use   Vaping Use: Never used  Substance and Sexual Activity   Alcohol use: No    Comment: stopped many years ago    Drug use: Never   Sexual activity: Not on file  Other Topics Concern   Not on file  Social History Narrative   Married. Retired Naval architect. Stopped smoking at age 55, smoked since age 79. Stopped alcohol use at age 26. Moved here from [place], [place] recently.    Extreme fear of needles.    Social Determinants of Health   Financial Resource Strain: Low Risk  (07/29/2021)   Overall Financial Resource Strain (CARDIA)    Difficulty of Paying Living Expenses: Not hard at all  Food Insecurity: No Food Insecurity (07/29/2021)   Hunger Vital Sign    Worried About Running Out of Food in the Last Year: Never true    Ran Out of Food in the Last Year: Never true  Transportation Needs: No Transportation Needs (07/29/2021)   PRAPARE -  Administrator, Civil Service (Medical): No    Lack of Transportation (Non-Medical): No  Physical Activity: Insufficiently Active (07/29/2021)   Exercise Vital Sign    Days of Exercise per Week: 7 days    Minutes of Exercise per Session: 10 min  Stress: No Stress Concern Present (07/29/2021)   [employer] of Occupational Health - Occupational Stress Questionnaire    Feeling of Stress : Not at all  Social Connections: Not on file  Intimate Partner Violence: Not on file      PHYSICAL EXAM  Vitals:   12/14/22 1132  BP: (!) 120/54  Pulse: 96  Weight: 159 lb (72.1 kg)  Height:  (1.702  m)   Body mass index is 24.9 kg/m.     12/14/2022   11:47 AM  MMSE - Mini Mental State Exam  Orientation to time 0  Orientation to Place 4  Registration 3  Attention/ Calculation 0  Recall 1  Language- name 2 objects 2  Language- repeat 0  Language- follow 3 step command 3  Language- read & follow direction 1  Write a sentence 0  Copy design 0  Total score 14     Generalized: Well developed, in no acute distress   Neurological examination  Mentation: Alert oriented to time, place, history taking. Follows all commands speech and language fluent Cranial nerve II-XII: Pupils were equal round reactive to light. Extraocular movements were full, visual field were full on confrontational test. Facial sensation and strength were normal. . Head turning and shoulder shrug  were normal and symmetric. Motor: The motor testing reveals 5 over 5 strength of all 4 extremities. Good symmetric motor tone is noted throughout.  Sensory: Sensory testing is intact to soft touch on all 4 extremities. No evidence of extinction is noted.  Coordination: Cerebellar testing reveals good finger-nose-finger and heel-to-shin bilaterally.  Gait and station: Uses a walker when ambulating.   DIAGNOSTIC DATA (LABS, IMAGING, TESTING) - I reviewed patient records, labs, notes, testing and imaging  myself where available.  Lab Results  Component Value Date   WBC 6.9 10/21/2022   HGB 11.5 (L) 10/21/2022   HCT 34.9 (L) 10/21/2022   MCV 122.0 (H) 10/21/2022   PLT 316 10/21/2022      Component Value Date/Time   NA 137 10/21/2022 1459   NA 144 08/31/2022 1111   K 3.8 10/21/2022 1459   CL 98 10/21/2022 1459   CO2 28 10/21/2022 1459   GLUCOSE 133 (H) 10/21/2022 1459   BUN 25 (H) 10/21/2022 1459   BUN 20 08/31/2022 1111   CREATININE 1.57 (H) 10/21/2022 1459   CREATININE 1.46 (H) 02/12/2019 1202   CALCIUM 9.0 10/21/2022 1459   PROT 7.8 10/21/2022 1459   PROT 7.2 08/31/2022 1111   ALBUMIN 2.7 (L) 10/21/2022 1459   ALBUMIN 3.5 (L) 08/31/2022 1111   AST 40 10/21/2022 1459   AST 29 02/12/2019 1202   ALT 23 10/21/2022 1459   ALT 33 02/12/2019 1202   ALKPHOS 143 (H) 10/21/2022 1459   BILITOT 2.7 (H) 10/21/2022 1459   BILITOT 3.7 (H) 08/31/2022 1111   BILITOT 1.9 (H) 02/12/2019 1202   GFRNONAA 45 (L) 10/21/2022 1459   GFRNONAA 47 (L) 02/12/2019 1202   GFRAA 54 (L) 02/12/2019 1202   Lab Results  Component Value Date   CHOL 95 (L) 08/31/2022   HDL 26 (L) 08/31/2022   LDLCALC 57 08/31/2022   TRIG 45 08/31/2022   CHOLHDL 3.7 08/31/2022   No results found for: "HGBA1C" Lab Results  Component Value Date   VITAMINB12 782 05/22/2022   Lab Results  Component Value Date   TSH 2.460 08/31/2022      ASSESSMENT AND PLAN 77 y.o. year old male  has a past medical history of Elevated liver function tests (01/17/2019), Elevated serum creatinine (01/17/2019), Fear of needles (01/17/2019), Former smoker, stopped smoking many years ago (01/17/2019), Hypertension, Polycythemia (01/17/2019), and Thrombocytosis (01/17/2019). here with:  1.  Memory disturbance  - MMSE 14/30 -Increase Aricept to 10 mg at bedtime.  Reviewed potential side effects with the patient -Advised if symptoms worsen or if he develops new symptoms he should let us know -Follow-up in 6 months  or sooner if  needed     Butch Penny, MSN, NP-C 12/14/2022, 11:37 AM [facility] [address] [place], [place] [address] [phone]

## 2022-12-19 ENCOUNTER — Telehealth: Payer: Self-pay | Admitting: Medical

## 2022-12-19 ENCOUNTER — Other Ambulatory Visit: Payer: Self-pay | Admitting: Cardiovascular Disease

## 2022-12-19 NOTE — Telephone Encounter (Signed)
Pt daughter called regarding pt stating that ever since pt started the potassium twice a day he has been sluggish and do not have any get and go, andhis blood pressure was 109/69 Talked to shane per him th pt is ok to take 1 potassium a day and I lasix a day pt will call back with any leg swelling to

## 2022-12-20 DIAGNOSIS — N289 Disorder of kidney and ureter, unspecified: Secondary | ICD-10-CM | POA: Diagnosis not present

## 2022-12-20 DIAGNOSIS — I10 Essential (primary) hypertension: Secondary | ICD-10-CM | POA: Diagnosis not present

## 2022-12-20 DIAGNOSIS — G309 Alzheimer's disease, unspecified: Secondary | ICD-10-CM | POA: Diagnosis not present

## 2022-12-20 DIAGNOSIS — F39 Unspecified mood [affective] disorder: Secondary | ICD-10-CM | POA: Diagnosis not present

## 2022-12-20 DIAGNOSIS — F0280 Dementia in other diseases classified elsewhere without behavioral disturbance: Secondary | ICD-10-CM | POA: Diagnosis not present

## 2022-12-20 DIAGNOSIS — Z993 Dependence on wheelchair: Secondary | ICD-10-CM | POA: Diagnosis not present

## 2022-12-20 DIAGNOSIS — H409 Unspecified glaucoma: Secondary | ICD-10-CM | POA: Diagnosis not present

## 2022-12-20 DIAGNOSIS — Z7982 Long term (current) use of aspirin: Secondary | ICD-10-CM | POA: Diagnosis not present

## 2022-12-20 DIAGNOSIS — D649 Anemia, unspecified: Secondary | ICD-10-CM | POA: Diagnosis not present

## 2022-12-21 DIAGNOSIS — H409 Unspecified glaucoma: Secondary | ICD-10-CM | POA: Diagnosis not present

## 2022-12-21 DIAGNOSIS — G309 Alzheimer's disease, unspecified: Secondary | ICD-10-CM | POA: Diagnosis not present

## 2022-12-21 DIAGNOSIS — F39 Unspecified mood [affective] disorder: Secondary | ICD-10-CM | POA: Diagnosis not present

## 2022-12-21 DIAGNOSIS — N289 Disorder of kidney and ureter, unspecified: Secondary | ICD-10-CM | POA: Diagnosis not present

## 2022-12-21 DIAGNOSIS — Z993 Dependence on wheelchair: Secondary | ICD-10-CM | POA: Diagnosis not present

## 2022-12-21 DIAGNOSIS — D649 Anemia, unspecified: Secondary | ICD-10-CM | POA: Diagnosis not present

## 2022-12-21 DIAGNOSIS — I10 Essential (primary) hypertension: Secondary | ICD-10-CM | POA: Diagnosis not present

## 2022-12-21 DIAGNOSIS — F0280 Dementia in other diseases classified elsewhere without behavioral disturbance: Secondary | ICD-10-CM | POA: Diagnosis not present

## 2022-12-21 DIAGNOSIS — Z7982 Long term (current) use of aspirin: Secondary | ICD-10-CM | POA: Diagnosis not present

## 2022-12-22 ENCOUNTER — Ambulatory Visit (INDEPENDENT_AMBULATORY_CARE_PROVIDER_SITE_OTHER): Payer: Medicare HMO | Admitting: Medical

## 2022-12-22 VITALS — BP 118/62 | HR 74 | Wt 156.6 lb

## 2022-12-22 DIAGNOSIS — R2689 Other abnormalities of gait and mobility: Secondary | ICD-10-CM | POA: Diagnosis not present

## 2022-12-22 DIAGNOSIS — N1831 Chronic kidney disease, stage 3a: Secondary | ICD-10-CM | POA: Diagnosis not present

## 2022-12-22 DIAGNOSIS — Z9181 History of falling: Secondary | ICD-10-CM

## 2022-12-22 DIAGNOSIS — R7302 Impaired glucose tolerance (oral): Secondary | ICD-10-CM | POA: Diagnosis not present

## 2022-12-22 DIAGNOSIS — I10 Essential (primary) hypertension: Secondary | ICD-10-CM

## 2022-12-22 DIAGNOSIS — I5020 Unspecified systolic (congestive) heart failure: Secondary | ICD-10-CM | POA: Diagnosis not present

## 2022-12-22 DIAGNOSIS — F03C0 Unspecified dementia, severe, without behavioral disturbance, psychotic disturbance, mood disturbance, and anxiety: Secondary | ICD-10-CM

## 2022-12-22 DIAGNOSIS — R278 Other lack of coordination: Secondary | ICD-10-CM | POA: Diagnosis not present

## 2022-12-22 LAB — POCT GLYCOSYLATED HEMOGLOBIN (HGB A1C): Hemoglobin A1C: 5.1 % (ref 4.0–5.6)

## 2022-12-22 NOTE — Patient Instructions (Signed)
I reviewed recent blood work he had March 2024.  In reflection, he was on hospice in the past year but seem to be wasting away.  The family decided to try something different so we had him discharged from hospice and since he has been doing some therapy at home he is really seen some improvements in recent weeks.  He is up moving more, using a walker.  Not sitting as much for hours and hours at a time.  He honestly looks the best today than I have seen in a while  Severe dementia-I reviewed his recent neurology notes from last week.  His Aricept was increased.  History of heart failure, hypertension Overall he seems to be doing fairly well today, seems to be euvolemic.  He has lost down from over 180 pounds in January 2 current weight of 156 pounds.  I believe this is a combination of overall getting up and moving around more, losing some fluid weight taking medication regularly and may be doing better with diet.  No symptoms of concern today.  He will continue Lasix 80 mg once daily, potassium once daily, valsartan 160 mg daily.  Follow-up with cardiology later this month as scheduled.  He is due for updated echocardiogram  Impaired glucose-hemoglobin A1c in the normal range today  CKD 3-stable  Balance problem, at risk for falling-referral to outpatient physical therapy.  He has made progress within home therapy through home health but we will switch over to outpatient

## 2022-12-22 NOTE — Progress Notes (Signed)
Subjective:  James Bryant is a 77 y.o. male who presents for Chief Complaint  Patient presents with   follow-up    Follow-up on Blood pressure. BP has been running normal     Here today with his wife James Bryant.  He has a medical history consistent with hypertension, polycythemia, strong fear of needles, dementia, CKD 3, CHF, vitamin D deficiency.  They called in a few days ago as he was feeling sluggish with his potassium being twice a day and Lasix twice a day.  Since cutting down to once a day he feels better.  Here for follow-up  No particular complaints today.  He continues with physical therapy through home health and wants to try outpatient physical therapy.  He mainly wants help with balance that he can feel more comfortable and safe walking.  He is using a rolling walker.  He has a cardiology follow-up visit planned for later this month  He does tolerate neurology recently for consult and updated med check on dementia  No new complaints today  He does not drink soda or alcohol.  No recent excess salt intake.  No other aggravating or relieving factors.    No other c/o.  Past Medical History:  Diagnosis Date   Elevated liver function tests 01/17/2019   Elevated serum creatinine 01/17/2019   Fear of needles 01/17/2019   Former smoker, stopped smoking many years ago 01/17/2019   Hypertension    Polycythemia 01/17/2019   Thrombocytosis 01/17/2019   Current Outpatient Medications on File Prior to Visit  Medication Sig Dispense Refill   albuterol (PROVENTIL) (2.5 MG/3ML) 0.083% nebulizer solution Take 3 mLs (2.5 mg total) by nebulization every 6 (six) hours as needed for wheezing or shortness of breath. 150 mL 1   ASPIRIN LOW DOSE 81 MG tablet TAKE 1 TABLET EVERY DAY 90 tablet 3   brimonidine (ALPHAGAN) 0.2 % ophthalmic solution SMARTSIG:In Eye(s)     donepezil (ARICEPT) 10 MG tablet Take 1 tablet (10 mg total) by mouth at bedtime. 30 tablet 11   ferrous gluconate (FERGON) 324  MG tablet Take 1 tablet (324 mg total) by mouth daily with breakfast. 90 tablet 1   furosemide (LASIX) 80 MG tablet TAKE 1 TABLET(80 MG) BY MOUTH TWICE DAILY (Patient taking differently: Take 80 mg by mouth daily.) 180 tablet 1   hydroxyurea (HYDREA) 500 MG capsule TAKE 1 CAPSULE (500 MG TOTAL) BY MOUTH 2 (TWO) TIMES DAILY. MAY TAKE WITH FOOD TO MINIMIZE GI SIDE EFFECTS. 180 capsule 3   LORazepam (ATIVAN) 0.5 MG tablet Take 1 tablet (0.5 mg total) by mouth 2 (two) times daily. (Patient taking differently: Take 0.5 mg by mouth every evening.) 5 tablet 0   metoprolol succinate (TOPROL XL) 25 MG 24 hr tablet Take 1 tablet (25 mg total) by mouth daily. 90 tablet 0   Multiple Vitamins-Minerals (MEGA MULTIVITAMIN FOR MEN PO) Take 1 tablet by mouth daily.     nystatin cream (MYCOSTATIN) Apply 1 Application topically as needed.     OLANZapine (ZYPREXA) 5 MG tablet TAKE 1 TABLET BY MOUTH NIGHTLY AT 7 PM 90 tablet 0   potassium chloride (KLOR-CON) 10 MEQ tablet TAKE 1 TABLET(10 MEQ) BY MOUTH TWICE DAILY 60 tablet 5   valsartan (DIOVAN) 160 MG tablet TAKE 1 TABLET EVERY DAY 90 tablet 3   No current facility-administered medications on file prior to visit.      The following portions of the patient's history were reviewed and updated as appropriate: allergies, current  medications, past family history, past medical history, past social history, past surgical history and problem list.  ROS Otherwise as in subjective above     Objective: BP 118/62   Pulse 74   Wt 156 lb 9.6 oz (71 kg)   BMI 24.53 kg/m   Wt Readings from Last 3 Encounters:  12/22/22 156 lb 9.6 oz (71 kg)  12/14/22 159 lb (72.1 kg)  12/11/22 154 lb (69.9 kg)   BP Readings from Last 3 Encounters:  12/22/22 118/62  12/14/22 (!) 120/54  12/11/22 139/70   General appearance: alert, no distress, well developed, well nourished Neck: supple, no lymphadenopathy, no thyromegaly, no mass, no JVD or bruits Heart: RRR, normal S1, S2,  no murmurs Lungs: CTA bilaterally, no wheezes, rhonchi, or rales Abdomen: +bs, soft, non tender, non distended, no masses, no hepatomegaly, no splenomegaly Pulses: 2+ radial pulses, 2+ pedal pulses, normal cap refill Ext: no edema   Assessment: Encounter Diagnoses  Name Primary?   Severe dementia, unspecified dementia type, unspecified whether behavioral, psychotic, or mood disturbance or anxiety (HCC) Yes   Primary hypertension    Systolic congestive heart failure with reduced left ventricular function, NYHA class 2 (HCC)    Impaired glucose tolerance    Stage 3a chronic kidney disease (HCC)    Balance problem    At risk for falling    Coordination abnormal      Plan: I reviewed recent blood work he had March 2024.  In reflection, he was on hospice in the past year but seem to be wasting away.  The family decided to try something different so we had him discharged from hospice and since he has been doing some therapy at home he is really seen some improvements in recent weeks.  He is up moving more, using a walker.  Not sitting as much for hours and hours at a time.  He honestly looks the best today than I have seen in a while  Severe dementia-I reviewed his recent neurology notes from last week.  His Aricept was increased.  History of heart failure, hypertension Overall he seems to be doing fairly well today, seems to be euvolemic.  He has lost down from over 180 pounds in January 2 current weight of 156 pounds.  I believe this is a combination of overall getting up and moving around more, losing some fluid weight taking medication regularly and may be doing better with diet.  No symptoms of concern today.  He will continue Lasix 80 mg once daily, potassium once daily, valsartan 160 mg daily.  Follow-up with cardiology later this month as scheduled.  He is due for updated echocardiogram  Impaired glucose-hemoglobin A1c in the normal range today  CKD 3-stable  Balance problem,  at risk for falling-referral to outpatient physical therapy.  He has made progress within home therapy through home health but we will switch over to outpatient    James Bryant was seen today for follow-up.  Diagnoses and all orders for this visit:  Severe dementia, unspecified dementia type, unspecified whether behavioral, psychotic, or mood disturbance or anxiety (HCC)  Primary hypertension  Systolic congestive heart failure with reduced left ventricular function, NYHA class 2 (HCC)  Impaired glucose tolerance -     HgB A1c  Stage 3a chronic kidney disease (HCC)  Balance problem  At risk for falling  Coordination abnormal    Follow up: With cardiology later this month as planned

## 2022-12-23 ENCOUNTER — Other Ambulatory Visit: Payer: Self-pay | Admitting: Medical

## 2022-12-25 ENCOUNTER — Other Ambulatory Visit: Payer: Self-pay | Admitting: Internal Medicine

## 2022-12-25 DIAGNOSIS — Z7982 Long term (current) use of aspirin: Secondary | ICD-10-CM | POA: Diagnosis not present

## 2022-12-25 DIAGNOSIS — F39 Unspecified mood [affective] disorder: Secondary | ICD-10-CM | POA: Diagnosis not present

## 2022-12-25 DIAGNOSIS — I10 Essential (primary) hypertension: Secondary | ICD-10-CM | POA: Diagnosis not present

## 2022-12-25 DIAGNOSIS — R2689 Other abnormalities of gait and mobility: Secondary | ICD-10-CM

## 2022-12-25 DIAGNOSIS — G309 Alzheimer's disease, unspecified: Secondary | ICD-10-CM | POA: Diagnosis not present

## 2022-12-25 DIAGNOSIS — F0280 Dementia in other diseases classified elsewhere without behavioral disturbance: Secondary | ICD-10-CM | POA: Diagnosis not present

## 2022-12-25 DIAGNOSIS — H409 Unspecified glaucoma: Secondary | ICD-10-CM | POA: Diagnosis not present

## 2022-12-25 DIAGNOSIS — D649 Anemia, unspecified: Secondary | ICD-10-CM | POA: Diagnosis not present

## 2022-12-25 DIAGNOSIS — N289 Disorder of kidney and ureter, unspecified: Secondary | ICD-10-CM | POA: Diagnosis not present

## 2022-12-25 DIAGNOSIS — Z9181 History of falling: Secondary | ICD-10-CM

## 2022-12-25 DIAGNOSIS — Z993 Dependence on wheelchair: Secondary | ICD-10-CM | POA: Diagnosis not present

## 2022-12-25 NOTE — Progress Notes (Signed)
Placed referral  

## 2022-12-31 ENCOUNTER — Other Ambulatory Visit: Payer: Self-pay | Admitting: Medical

## 2023-01-01 DIAGNOSIS — N289 Disorder of kidney and ureter, unspecified: Secondary | ICD-10-CM | POA: Diagnosis not present

## 2023-01-01 DIAGNOSIS — Z7982 Long term (current) use of aspirin: Secondary | ICD-10-CM | POA: Diagnosis not present

## 2023-01-01 DIAGNOSIS — F39 Unspecified mood [affective] disorder: Secondary | ICD-10-CM | POA: Diagnosis not present

## 2023-01-01 DIAGNOSIS — F0280 Dementia in other diseases classified elsewhere without behavioral disturbance: Secondary | ICD-10-CM | POA: Diagnosis not present

## 2023-01-01 DIAGNOSIS — D649 Anemia, unspecified: Secondary | ICD-10-CM | POA: Diagnosis not present

## 2023-01-01 DIAGNOSIS — I10 Essential (primary) hypertension: Secondary | ICD-10-CM | POA: Diagnosis not present

## 2023-01-01 DIAGNOSIS — G309 Alzheimer's disease, unspecified: Secondary | ICD-10-CM | POA: Diagnosis not present

## 2023-01-01 DIAGNOSIS — Z993 Dependence on wheelchair: Secondary | ICD-10-CM | POA: Diagnosis not present

## 2023-01-01 DIAGNOSIS — H409 Unspecified glaucoma: Secondary | ICD-10-CM | POA: Diagnosis not present

## 2023-01-03 DIAGNOSIS — N289 Disorder of kidney and ureter, unspecified: Secondary | ICD-10-CM | POA: Diagnosis not present

## 2023-01-03 DIAGNOSIS — H409 Unspecified glaucoma: Secondary | ICD-10-CM | POA: Diagnosis not present

## 2023-01-03 DIAGNOSIS — D649 Anemia, unspecified: Secondary | ICD-10-CM | POA: Diagnosis not present

## 2023-01-03 DIAGNOSIS — Z7982 Long term (current) use of aspirin: Secondary | ICD-10-CM | POA: Diagnosis not present

## 2023-01-03 DIAGNOSIS — F0280 Dementia in other diseases classified elsewhere without behavioral disturbance: Secondary | ICD-10-CM | POA: Diagnosis not present

## 2023-01-03 DIAGNOSIS — G309 Alzheimer's disease, unspecified: Secondary | ICD-10-CM | POA: Diagnosis not present

## 2023-01-03 DIAGNOSIS — Z993 Dependence on wheelchair: Secondary | ICD-10-CM | POA: Diagnosis not present

## 2023-01-03 DIAGNOSIS — I10 Essential (primary) hypertension: Secondary | ICD-10-CM | POA: Diagnosis not present

## 2023-01-03 DIAGNOSIS — F39 Unspecified mood [affective] disorder: Secondary | ICD-10-CM | POA: Diagnosis not present

## 2023-01-08 ENCOUNTER — Encounter: Payer: Self-pay | Admitting: Cardiovascular Disease

## 2023-01-08 NOTE — Progress Notes (Unsigned)
Cardiology Office Note:    Date:  01/09/2023   ID:  James Bryant [birth year], MRN [id number]  PCP:  Jac Canavan, PA-C   [facility] Providers Cardiologist:  Kristeen Miss, MD     Referring MD: Jac Canavan, PA-C   Chief Complaint  Patient presents with   Congestive Heart Failure     Aug. 23, 2022   James Bryant is a 77 y.o. male with a hx of HTN  I saw him via video visit durig covid. No CP or dyspnea. Able to do his normal chores.   Has someone mow for him .  Echo shows EF 25-30%.  Grade 1 DD   Jan 09 2023  Seen with James Bryant, wife  James Bryant is seen seen for follow-up of his congestive heart failure, hypertension. He has fairly severe dementia  Seems to be doing better  Had some volume overload , was seen by primary  Was seen in the ER and then was sent to [facility] Was falling frequently  Now, is not falling as much  Still not steady on his feet  Will be starting PT for balance this week  Walks with cane or rolling walker     Past Medical History:  Diagnosis Date   Elevated liver function tests 01/17/2019   Elevated serum creatinine 01/17/2019   Fear of needles 01/17/2019   Former smoker, stopped smoking many years ago 01/17/2019   Hypertension    Polycythemia 01/17/2019   Thrombocytosis 01/17/2019    History reviewed. No pertinent surgical history.  Current Medications: Current Meds  Medication Sig   albuterol (PROVENTIL) (2.5 MG/3ML) 0.083% nebulizer solution Take 3 mLs (2.5 mg total) by nebulization every 6 (six) hours as needed for wheezing or shortness of breath.   ASPIRIN LOW DOSE 81 MG tablet TAKE 1 TABLET EVERY DAY   brimonidine (ALPHAGAN) 0.2 % ophthalmic solution SMARTSIG:In Eye(s)   donepezil (ARICEPT) 10 MG tablet Take 1 tablet (10 mg total) by mouth at bedtime.   ferrous gluconate (FERGON) 324 MG tablet Take 1 tablet (324 mg total) by mouth daily with breakfast.   furosemide (LASIX) 80 MG tablet Take 1 tablet (80 mg  total) by mouth daily.   hydroxyurea (HYDREA) 500 MG capsule TAKE 1 CAPSULE (500 MG TOTAL) BY MOUTH 2 (TWO) TIMES DAILY. MAY TAKE WITH FOOD TO MINIMIZE GI SIDE EFFECTS.   LORazepam (ATIVAN) 0.5 MG tablet Take 1 tablet (0.5 mg total) by mouth 2 (two) times daily. (Patient taking differently: Take 0.5 mg by mouth every evening.)   metoprolol succinate (TOPROL XL) 25 MG 24 hr tablet Take 1 tablet (25 mg total) by mouth daily.   Multiple Vitamins-Minerals (MEGA MULTIVITAMIN FOR MEN PO) Take 1 tablet by mouth daily.   nystatin cream (MYCOSTATIN) Apply 1 Application topically as needed.   OLANZapine (ZYPREXA) 5 MG tablet TAKE 1 TABLET BY MOUTH NIGHTLY AT 7 PM   potassium chloride (KLOR-CON) 10 MEQ tablet TAKE 1 TABLET(10 MEQ) BY MOUTH TWICE DAILY   valsartan (DIOVAN) 160 MG tablet TAKE 1 TABLET EVERY DAY     Allergies:   Patient has no known allergies.   Social History   Socioeconomic History   Marital status: Married    Spouse name: Not on file   Number of children: Not on file   Years of education: Not on file   Highest education level: Not on file  Occupational History   Not on file  Tobacco Use   Smoking status:  Former   Smokeless tobacco: Never  Building services engineer Use: Never used  Substance and Sexual Activity   Alcohol use: No    Comment: stopped many years ago    Drug use: Never   Sexual activity: Not on file  Other Topics Concern   Not on file  Social History Narrative   Married. Retired Naval architect. Stopped smoking at age 40, smoked since age 58. Stopped alcohol use at age 8. Moved here from [place], [place] recently.    Extreme fear of needles.    Social Determinants of Health   Financial Resource Strain: Low Risk  (07/29/2021)   Overall Financial Resource Strain (CARDIA)    Difficulty of Paying Living Expenses: Not hard at all  Food Insecurity: No Food Insecurity (07/29/2021)   Hunger Vital Sign    Worried About Running Out of Food in the Last Year: Never true     Ran Out of Food in the Last Year: Never true  Transportation Needs: No Transportation Needs (07/29/2021)   PRAPARE - Administrator, Civil Service (Medical): No    Lack of Transportation (Non-Medical): No  Physical Activity: Insufficiently Active (07/29/2021)   Exercise Vital Sign    Days of Exercise per Week: 7 days    Minutes of Exercise per Session: 10 min  Stress: No Stress Concern Present (07/29/2021)   [employer] of Occupational Health - Occupational Stress Questionnaire    Feeling of Stress : Not at all  Social Connections: Not on file     Family History: The patient's family history includes Heart disease in his mother; Hypertension in his mother. There is no history of Alzheimer's disease or Dementia.  ROS:   Please see the history of present illness.     All other systems reviewed and are negative.  EKGs/Labs/Other Studies Reviewed:    The following studies were reviewed today:   EKG:    Recent Labs: 08/31/2022: BNP 4,276.1; TSH 2.460 10/21/2022: ALT 23; BUN 25; Creatinine, Ser 1.57; Hemoglobin 11.5; Platelets 316; Potassium 3.8; Sodium 137  Recent Lipid Panel    Component Value Date/Time   CHOL 95 (L) 08/31/2022 1111   TRIG 45 08/31/2022 1111   HDL 26 (L) 08/31/2022 1111   CHOLHDL 3.7 08/31/2022 1111   LDLCALC 57 08/31/2022 1111     Risk Assessment/Calculations:          Physical Exam:    Physical Exam: Blood pressure 134/68, pulse 96, height 5\' 7"  (1.702 m), weight 159 lb (72.1 kg), SpO2 97 %.       GEN:  Well nourished, well developed in no acute distress HEENT: Normal NECK: No JVD; No carotid bruits LYMPHATICS: No lymphadenopathy CARDIAC: RRR , no murmurs, rubs, gallops RESPIRATORY:  Clear to auscultation without rales, wheezing or rhonchi  ABDOMEN: Soft, non-tender, non-distended MUSCULOSKELETAL:  No edema; No deformity  SKIN: Warm and dry NEUROLOGIC:  Alert and oriented x 3   ASSESSMENT:    1. NICM (nonischemic  cardiomyopathy) (HCC)     PLAN:       Chronic combined CHF :   seems to be doing well.   Cont current meds.   His last echo was 4 years ago.  Will repeat echo  Will have him return to see an app in 6 months for follow up of his CHF  BMP today   2.  CKD -    3.  Hyperkalemic:       Please call [facility] with  all result and appointments  - [phone]    Shared Decision Making/Informed Consent The risks [chest pain, shortness of breath, cardiac arrhythmias, dizziness, blood pressure fluctuations, myocardial infarction, stroke/transient ischemic attack, nausea, vomiting, allergic reaction, radiation exposure, metallic taste sensation and life-threatening complications (estimated to be 1 in 10,000)], benefits (risk stratification, diagnosing coronary artery disease, treatment guidance) and alternatives of a nuclear stress test were discussed in detail with James Bryant and he agrees to proceed.    Medication Adjustments/Labs and Tests Ordered: Current medicines are reviewed at length with the patient today.  Concerns regarding medicines are outlined above.  Orders Placed This Encounter  Procedures   Basic Metabolic Panel (BMET)   ECHOCARDIOGRAM COMPLETE    No orders of the defined types were placed in this encounter.    Patient Instructions  Medication Instructions:  Your physician recommends that you continue on your current medications as directed. Please refer to the Current Medication list given to you today.  *If you need a refill on your cardiac medications before your next appointment, please call your pharmacy*  Lab Work: Your physician recommends that you have lab work today- BMET  If you have labs (blood work) drawn today and your tests are completely normal, you will receive your results only by: MyChart Message (if you have MyChart) OR A paper copy in the mail If you have any lab test that is abnormal or we need to change your treatment, we will call you to  review the results.  Testing/Procedures: Your physician has requested that you have an echocardiogram. Echocardiography is a painless test that uses sound waves to create images of your heart. It provides your doctor with information about the size and shape of your heart and how well your heart's chambers and valves are working. This procedure takes approximately one hour. There are no restrictions for this procedure. Please do NOT wear cologne, perfume, aftershave, or lotions (deodorant is allowed). Please arrive 15 minutes prior to your appointment time.    Follow-Up: At [facility], you and your health needs are our priority.  As part of our continuing mission to provide you with exceptional heart care, we have created designated Provider Care Teams.  These Care Teams include your primary Cardiologist (physician) and Advanced Practice Providers (APPs -  Physician Assistants and Nurse Practitioners) who all work together to provide you with the care you need, when you need it.  We recommend signing up for the patient portal called "MyChart".  Sign up information is provided on this After Visit Summary.  MyChart is used to connect with patients for Virtual Visits (Telemedicine).  Patients are able to view lab/test results, encounter notes, upcoming appointments, etc.  Non-urgent messages can be sent to your provider as well.   To learn more about what you can do with MyChart, go to [email or web address].    Your next appointment:   6 month(s)  Provider:   Eligha Bridegroom, NP     Then, Kristeen Miss, MD will plan to see you again in 1 year(s).       Signed, Kristeen Miss, MD  01/09/2023 1:05 PM    [facility]

## 2023-01-09 ENCOUNTER — Ambulatory Visit: Payer: Medicare HMO | Attending: Cardiovascular Disease | Admitting: Cardiovascular Disease

## 2023-01-09 ENCOUNTER — Encounter: Payer: Self-pay | Admitting: Cardiovascular Disease

## 2023-01-09 VITALS — BP 134/68 | HR 96 | Ht 67.0 in | Wt 159.0 lb

## 2023-01-09 DIAGNOSIS — I428 Other cardiomyopathies: Secondary | ICD-10-CM | POA: Diagnosis not present

## 2023-01-09 LAB — BASIC METABOLIC PANEL
BUN/Creatinine Ratio: 15 (ref 10–24)
BUN: 24 mg/dL (ref 8–27)
CO2: 24 mmol/L (ref 20–29)
Calcium: 8.7 mg/dL (ref 8.6–10.2)
Chloride: 99 mmol/L (ref 96–106)
Creatinine, Ser: 1.58 mg/dL — ABNORMAL HIGH (ref 0.76–1.27)
Glucose: 88 mg/dL (ref 70–99)
Potassium: 4.1 mmol/L (ref 3.5–5.2)
Sodium: 138 mmol/L (ref 134–144)
eGFR: 44 mL/min/{1.73_m2} — ABNORMAL LOW (ref >59)

## 2023-01-09 NOTE — Patient Instructions (Signed)
Medication Instructions:  Your physician recommends that you continue on your current medications as directed. Please refer to the Current Medication list given to you today.  *If you need a refill on your cardiac medications before your next appointment, please call your pharmacy*  Lab Work: Your physician recommends that you have lab work today- BMET  If you have labs (blood work) drawn today and your tests are completely normal, you will receive your results only by: MyChart Message (if you have MyChart) OR A paper copy in the mail If you have any lab test that is abnormal or we need to change your treatment, we will call you to review the results.  Testing/Procedures: Your physician has requested that you have an echocardiogram. Echocardiography is a painless test that uses sound waves to create images of your heart. It provides your doctor with information about the size and shape of your heart and how well your heart's chambers and valves are working. This procedure takes approximately one hour. There are no restrictions for this procedure. Please do NOT wear cologne, perfume, aftershave, or lotions (deodorant is allowed). Please arrive 15 minutes prior to your appointment time.    Follow-Up: At [facility], you and your health needs are our priority.  As part of our continuing mission to provide you with exceptional heart care, we have created designated Provider Care Teams.  These Care Teams include your primary Cardiologist (physician) and Advanced Practice Providers (APPs -  Physician Assistants and Nurse Practitioners) who all work together to provide you with the care you need, when you need it.  We recommend signing up for the patient portal called "MyChart".  Sign up information is provided on this After Visit Summary.  MyChart is used to connect with patients for Virtual Visits (Telemedicine).  Patients are able to view lab/test results, encounter notes, upcoming  appointments, etc.  Non-urgent messages can be sent to your provider as well.   To learn more about what you can do with MyChart, go to [email or web address].    Your next appointment:   6 month(s)  Provider:   Eligha Bridegroom, NP     Then, Kristeen Miss, MD will plan to see you again in 1 year(s).

## 2023-01-10 ENCOUNTER — Other Ambulatory Visit: Payer: Medicare HMO

## 2023-01-10 VITALS — BP 116/60 | HR 83 | Temp 97.0°F

## 2023-01-10 DIAGNOSIS — Z515 Encounter for palliative care: Secondary | ICD-10-CM

## 2023-01-10 NOTE — Progress Notes (Signed)
PATIENT NAME: James Bryant DOB: Dec 17, 1945 MRN: [id number]  PRIMARY CARE PROVIDER: Jac Canavan, PA-C  RESPONSIBLE PARTY:  Acct ID - Guarantor Home Phone Work Phone Relationship Acct Type  [id number] [name], [name] [phone]  Self P/F     [address] [place], [place] [address]    Palliative Care Follow Up Encounter Note    Completed home visit. Wife James Bryant also present     HISTORY OF PRESENT ILLNESS:  77 y.o. male with memory loss, CHF, stage 3a CKD     Cognitive: pt is alert and conversationally appropriate during this visit; discussed Dementia booklet and discussed pt possibly reaching a plateau   Appetite: continues to eats 2-3 meals and snacks; drinks 80oz of water daily   Cardiac: no BLE edema noted; wife has elevated pt's feet; pt is wearing compression socks   Mobility: had PT w/[facility]; services stopped on 01/08/23; pt now going to PT for balance outside of the home - starts on 01/11/23; uses walker in the house and rollator for doctor appts and getting his snacks; wife reports pt now uses a cane to go down and up the stairs   ADLs; dependent on wife for assistance and cues   Sleeping Pattern: still gets up and down 2-3 times throughout the night d/t taking diuretics; pt is able to go back to sleep    GI/GU: still has BM daily; no urinary issues   Pain: pt denies pain at this time   Wt: pt's current wt is 155lbs     Goals of Care: Wife and daughter want to keep the pt at home as long as possible with occasional respite stays       CODE STATUS: DNR ADVANCED DIRECTIVES: N MOST FORM: Y PPS: 40%    PHYSICAL EXAM:   VITALS: Today's Vitals   01/10/23 1053  BP: 116/60  Pulse: 83  Temp: (!) 97 F (36.1 C)  TempSrc: Temporal  SpO2: 97%  PainSc: 0-No pain    LUNGS: clear to auscultation , decreased breath sounds CARDIAC: Cor RRR EXTREMITIES: AROM x4; no edema  SKIN: cool, dry,  NEURO: negative except for coordination problems,  memory problems, and weakness   Next scheduled appt: 02/27/23 @ 10:30am    Eily Louvier Clementeen Graham, LPN

## 2023-01-11 DIAGNOSIS — M25651 Stiffness of right hip, not elsewhere classified: Secondary | ICD-10-CM | POA: Diagnosis not present

## 2023-01-11 DIAGNOSIS — R29898 Other symptoms and signs involving the musculoskeletal system: Secondary | ICD-10-CM | POA: Diagnosis not present

## 2023-01-11 DIAGNOSIS — R262 Difficulty in walking, not elsewhere classified: Secondary | ICD-10-CM | POA: Diagnosis not present

## 2023-01-11 DIAGNOSIS — M5431 Sciatica, right side: Secondary | ICD-10-CM | POA: Diagnosis not present

## 2023-01-11 DIAGNOSIS — R293 Abnormal posture: Secondary | ICD-10-CM | POA: Diagnosis not present

## 2023-01-11 DIAGNOSIS — M25551 Pain in right hip: Secondary | ICD-10-CM | POA: Diagnosis not present

## 2023-01-11 DIAGNOSIS — M25652 Stiffness of left hip, not elsewhere classified: Secondary | ICD-10-CM | POA: Diagnosis not present

## 2023-01-16 DIAGNOSIS — R293 Abnormal posture: Secondary | ICD-10-CM | POA: Diagnosis not present

## 2023-01-16 DIAGNOSIS — M5431 Sciatica, right side: Secondary | ICD-10-CM | POA: Diagnosis not present

## 2023-01-16 DIAGNOSIS — R29898 Other symptoms and signs involving the musculoskeletal system: Secondary | ICD-10-CM | POA: Diagnosis not present

## 2023-01-16 DIAGNOSIS — R262 Difficulty in walking, not elsewhere classified: Secondary | ICD-10-CM | POA: Diagnosis not present

## 2023-01-16 DIAGNOSIS — M25551 Pain in right hip: Secondary | ICD-10-CM | POA: Diagnosis not present

## 2023-01-16 DIAGNOSIS — M25652 Stiffness of left hip, not elsewhere classified: Secondary | ICD-10-CM | POA: Diagnosis not present

## 2023-01-16 DIAGNOSIS — M25651 Stiffness of right hip, not elsewhere classified: Secondary | ICD-10-CM | POA: Diagnosis not present

## 2023-01-18 DIAGNOSIS — R262 Difficulty in walking, not elsewhere classified: Secondary | ICD-10-CM | POA: Diagnosis not present

## 2023-01-18 DIAGNOSIS — M25551 Pain in right hip: Secondary | ICD-10-CM | POA: Diagnosis not present

## 2023-01-18 DIAGNOSIS — M25652 Stiffness of left hip, not elsewhere classified: Secondary | ICD-10-CM | POA: Diagnosis not present

## 2023-01-18 DIAGNOSIS — M25651 Stiffness of right hip, not elsewhere classified: Secondary | ICD-10-CM | POA: Diagnosis not present

## 2023-01-18 DIAGNOSIS — M5431 Sciatica, right side: Secondary | ICD-10-CM | POA: Diagnosis not present

## 2023-01-18 DIAGNOSIS — R293 Abnormal posture: Secondary | ICD-10-CM | POA: Diagnosis not present

## 2023-01-18 DIAGNOSIS — R29898 Other symptoms and signs involving the musculoskeletal system: Secondary | ICD-10-CM | POA: Diagnosis not present

## 2023-01-23 DIAGNOSIS — R29898 Other symptoms and signs involving the musculoskeletal system: Secondary | ICD-10-CM | POA: Diagnosis not present

## 2023-01-23 DIAGNOSIS — M25551 Pain in right hip: Secondary | ICD-10-CM | POA: Diagnosis not present

## 2023-01-23 DIAGNOSIS — R262 Difficulty in walking, not elsewhere classified: Secondary | ICD-10-CM | POA: Diagnosis not present

## 2023-01-23 DIAGNOSIS — M25652 Stiffness of left hip, not elsewhere classified: Secondary | ICD-10-CM | POA: Diagnosis not present

## 2023-01-23 DIAGNOSIS — R293 Abnormal posture: Secondary | ICD-10-CM | POA: Diagnosis not present

## 2023-01-23 DIAGNOSIS — M25651 Stiffness of right hip, not elsewhere classified: Secondary | ICD-10-CM | POA: Diagnosis not present

## 2023-01-23 DIAGNOSIS — M5431 Sciatica, right side: Secondary | ICD-10-CM | POA: Diagnosis not present

## 2023-01-26 DIAGNOSIS — M5431 Sciatica, right side: Secondary | ICD-10-CM | POA: Diagnosis not present

## 2023-01-26 DIAGNOSIS — R262 Difficulty in walking, not elsewhere classified: Secondary | ICD-10-CM | POA: Diagnosis not present

## 2023-01-26 DIAGNOSIS — M25652 Stiffness of left hip, not elsewhere classified: Secondary | ICD-10-CM | POA: Diagnosis not present

## 2023-01-26 DIAGNOSIS — R29898 Other symptoms and signs involving the musculoskeletal system: Secondary | ICD-10-CM | POA: Diagnosis not present

## 2023-01-26 DIAGNOSIS — R293 Abnormal posture: Secondary | ICD-10-CM | POA: Diagnosis not present

## 2023-01-26 DIAGNOSIS — M25551 Pain in right hip: Secondary | ICD-10-CM | POA: Diagnosis not present

## 2023-01-26 DIAGNOSIS — M25651 Stiffness of right hip, not elsewhere classified: Secondary | ICD-10-CM | POA: Diagnosis not present

## 2023-01-29 DIAGNOSIS — R262 Difficulty in walking, not elsewhere classified: Secondary | ICD-10-CM | POA: Diagnosis not present

## 2023-01-29 DIAGNOSIS — R293 Abnormal posture: Secondary | ICD-10-CM | POA: Diagnosis not present

## 2023-01-29 DIAGNOSIS — M5431 Sciatica, right side: Secondary | ICD-10-CM | POA: Diagnosis not present

## 2023-01-29 DIAGNOSIS — R29898 Other symptoms and signs involving the musculoskeletal system: Secondary | ICD-10-CM | POA: Diagnosis not present

## 2023-01-29 DIAGNOSIS — M25652 Stiffness of left hip, not elsewhere classified: Secondary | ICD-10-CM | POA: Diagnosis not present

## 2023-01-29 DIAGNOSIS — M25651 Stiffness of right hip, not elsewhere classified: Secondary | ICD-10-CM | POA: Diagnosis not present

## 2023-01-29 DIAGNOSIS — M25551 Pain in right hip: Secondary | ICD-10-CM | POA: Diagnosis not present

## 2023-01-30 DIAGNOSIS — H401132 Primary open-angle glaucoma, bilateral, moderate stage: Secondary | ICD-10-CM | POA: Diagnosis not present

## 2023-01-31 DIAGNOSIS — R0902 Hypoxemia: Secondary | ICD-10-CM | POA: Diagnosis not present

## 2023-01-31 DIAGNOSIS — R61 Generalized hyperhidrosis: Secondary | ICD-10-CM | POA: Diagnosis not present

## 2023-01-31 DIAGNOSIS — I959 Hypotension, unspecified: Secondary | ICD-10-CM | POA: Diagnosis not present

## 2023-01-31 DIAGNOSIS — R55 Syncope and collapse: Secondary | ICD-10-CM | POA: Diagnosis not present

## 2023-02-02 DIAGNOSIS — R293 Abnormal posture: Secondary | ICD-10-CM | POA: Diagnosis not present

## 2023-02-02 DIAGNOSIS — R262 Difficulty in walking, not elsewhere classified: Secondary | ICD-10-CM | POA: Diagnosis not present

## 2023-02-02 DIAGNOSIS — M5431 Sciatica, right side: Secondary | ICD-10-CM | POA: Diagnosis not present

## 2023-02-02 DIAGNOSIS — M25551 Pain in right hip: Secondary | ICD-10-CM | POA: Diagnosis not present

## 2023-02-02 DIAGNOSIS — M25651 Stiffness of right hip, not elsewhere classified: Secondary | ICD-10-CM | POA: Diagnosis not present

## 2023-02-02 DIAGNOSIS — R29898 Other symptoms and signs involving the musculoskeletal system: Secondary | ICD-10-CM | POA: Diagnosis not present

## 2023-02-02 DIAGNOSIS — M25652 Stiffness of left hip, not elsewhere classified: Secondary | ICD-10-CM | POA: Diagnosis not present

## 2023-02-07 DIAGNOSIS — R29898 Other symptoms and signs involving the musculoskeletal system: Secondary | ICD-10-CM | POA: Diagnosis not present

## 2023-02-07 DIAGNOSIS — M25551 Pain in right hip: Secondary | ICD-10-CM | POA: Diagnosis not present

## 2023-02-07 DIAGNOSIS — M25652 Stiffness of left hip, not elsewhere classified: Secondary | ICD-10-CM | POA: Diagnosis not present

## 2023-02-07 DIAGNOSIS — M25651 Stiffness of right hip, not elsewhere classified: Secondary | ICD-10-CM | POA: Diagnosis not present

## 2023-02-07 DIAGNOSIS — R262 Difficulty in walking, not elsewhere classified: Secondary | ICD-10-CM | POA: Diagnosis not present

## 2023-02-07 DIAGNOSIS — R293 Abnormal posture: Secondary | ICD-10-CM | POA: Diagnosis not present

## 2023-02-07 DIAGNOSIS — M5431 Sciatica, right side: Secondary | ICD-10-CM | POA: Diagnosis not present

## 2023-02-08 ENCOUNTER — Ambulatory Visit: Payer: Medicare HMO | Admitting: Podiatry

## 2023-02-08 ENCOUNTER — Other Ambulatory Visit: Payer: Self-pay | Admitting: Physician Assistant

## 2023-02-08 DIAGNOSIS — M79674 Pain in right toe(s): Secondary | ICD-10-CM

## 2023-02-08 DIAGNOSIS — B351 Tinea unguium: Secondary | ICD-10-CM

## 2023-02-08 DIAGNOSIS — M79675 Pain in left toe(s): Secondary | ICD-10-CM

## 2023-02-08 DIAGNOSIS — D75839 Thrombocytosis, unspecified: Secondary | ICD-10-CM

## 2023-02-08 NOTE — Progress Notes (Signed)
  Subjective:  Patient ID: James Bryant, male    DOB: 05/26/1946,  MRN: [id number]  Chief Complaint  Patient presents with   Nail Problem    RFC    77 y.o. male presents with the above complaint. History confirmed with patient. Patient presenting with pain related to dystrophic thickened elongated nails. Patient is unable to trim own nails related to nail dystrophy and/or mobility issues. Patient does not have a history of T2DM. No calluses causing pain.  Objective:  Physical Exam: warm, good capillary refill nail exam onychomycosis of the toenails, onycholysis, and dystrophic nails DP pulses palpable, PT pulses palpable, and protective sensation intact Left Foot:  Pain with palpation of nails due to elongation and dystrophic growth.  Right Foot: Pain with palpation of nails due to elongation and dystrophic growth.   Assessment:   1. Pain due to onychomycosis of toenails of both feet        Plan:  Patient was evaluated and treated and all questions answered.   #Onychomycosis with pain  -Nails palliatively debrided as below. -Educated on self-care  Procedure: Nail Debridement Rationale: Pain Type of Debridement: manual, sharp debridement. Instrumentation: Nail nipper, rotary burr. Number of Nails: 10  Return in about 3 months (around 05/11/2023) for [facility].         Corinna Gab, DPM [facility] / [facility]

## 2023-02-08 NOTE — Telephone Encounter (Signed)
Refill request last apt 12/22/22. 

## 2023-02-13 ENCOUNTER — Ambulatory Visit (HOSPITAL_COMMUNITY): Payer: Medicare HMO | Attending: Cardiovascular Disease

## 2023-02-13 DIAGNOSIS — I428 Other cardiomyopathies: Secondary | ICD-10-CM | POA: Diagnosis not present

## 2023-02-13 LAB — ECHOCARDIOGRAM COMPLETE
Area-P 1/2: 3.61 cm2
P 1/2 time: 463 msec
S' Lateral: 5.5 cm

## 2023-02-27 ENCOUNTER — Other Ambulatory Visit: Payer: Medicare HMO

## 2023-02-27 ENCOUNTER — Other Ambulatory Visit: Payer: Self-pay | Admitting: Medical

## 2023-03-08 ENCOUNTER — Other Ambulatory Visit: Payer: Self-pay | Admitting: Medical

## 2023-03-13 DIAGNOSIS — M25652 Stiffness of left hip, not elsewhere classified: Secondary | ICD-10-CM | POA: Diagnosis not present

## 2023-03-13 DIAGNOSIS — M25651 Stiffness of right hip, not elsewhere classified: Secondary | ICD-10-CM | POA: Diagnosis not present

## 2023-03-13 DIAGNOSIS — R293 Abnormal posture: Secondary | ICD-10-CM | POA: Diagnosis not present

## 2023-03-13 DIAGNOSIS — R29898 Other symptoms and signs involving the musculoskeletal system: Secondary | ICD-10-CM | POA: Diagnosis not present

## 2023-03-13 DIAGNOSIS — M5431 Sciatica, right side: Secondary | ICD-10-CM | POA: Diagnosis not present

## 2023-03-13 DIAGNOSIS — R262 Difficulty in walking, not elsewhere classified: Secondary | ICD-10-CM | POA: Diagnosis not present

## 2023-03-13 DIAGNOSIS — M25551 Pain in right hip: Secondary | ICD-10-CM | POA: Diagnosis not present

## 2023-03-16 DIAGNOSIS — M5431 Sciatica, right side: Secondary | ICD-10-CM | POA: Diagnosis not present

## 2023-03-16 DIAGNOSIS — M25652 Stiffness of left hip, not elsewhere classified: Secondary | ICD-10-CM | POA: Diagnosis not present

## 2023-03-16 DIAGNOSIS — R262 Difficulty in walking, not elsewhere classified: Secondary | ICD-10-CM | POA: Diagnosis not present

## 2023-03-16 DIAGNOSIS — R293 Abnormal posture: Secondary | ICD-10-CM | POA: Diagnosis not present

## 2023-03-16 DIAGNOSIS — M25551 Pain in right hip: Secondary | ICD-10-CM | POA: Diagnosis not present

## 2023-03-16 DIAGNOSIS — R29898 Other symptoms and signs involving the musculoskeletal system: Secondary | ICD-10-CM | POA: Diagnosis not present

## 2023-03-16 DIAGNOSIS — M25651 Stiffness of right hip, not elsewhere classified: Secondary | ICD-10-CM | POA: Diagnosis not present

## 2023-03-21 DIAGNOSIS — R262 Difficulty in walking, not elsewhere classified: Secondary | ICD-10-CM | POA: Diagnosis not present

## 2023-03-21 DIAGNOSIS — M25651 Stiffness of right hip, not elsewhere classified: Secondary | ICD-10-CM | POA: Diagnosis not present

## 2023-03-21 DIAGNOSIS — M25652 Stiffness of left hip, not elsewhere classified: Secondary | ICD-10-CM | POA: Diagnosis not present

## 2023-03-21 DIAGNOSIS — M5431 Sciatica, right side: Secondary | ICD-10-CM | POA: Diagnosis not present

## 2023-03-21 DIAGNOSIS — M25551 Pain in right hip: Secondary | ICD-10-CM | POA: Diagnosis not present

## 2023-03-21 DIAGNOSIS — R29898 Other symptoms and signs involving the musculoskeletal system: Secondary | ICD-10-CM | POA: Diagnosis not present

## 2023-03-21 DIAGNOSIS — R293 Abnormal posture: Secondary | ICD-10-CM | POA: Diagnosis not present

## 2023-03-22 ENCOUNTER — Other Ambulatory Visit: Payer: Self-pay | Admitting: Cardiovascular Disease

## 2023-03-26 ENCOUNTER — Telehealth: Payer: Self-pay

## 2023-03-26 NOTE — Telephone Encounter (Signed)
Wife called regarding pt weight gain. States pt weights 154 lbs and has gained 3 lbs from fluid in legs. Wife requested that we call [name] (daughter) at [phone] with recommendations. Please advise.

## 2023-03-27 DIAGNOSIS — R262 Difficulty in walking, not elsewhere classified: Secondary | ICD-10-CM | POA: Diagnosis not present

## 2023-03-27 DIAGNOSIS — M5431 Sciatica, right side: Secondary | ICD-10-CM | POA: Diagnosis not present

## 2023-03-27 DIAGNOSIS — R293 Abnormal posture: Secondary | ICD-10-CM | POA: Diagnosis not present

## 2023-03-27 DIAGNOSIS — M25651 Stiffness of right hip, not elsewhere classified: Secondary | ICD-10-CM | POA: Diagnosis not present

## 2023-03-27 DIAGNOSIS — R29898 Other symptoms and signs involving the musculoskeletal system: Secondary | ICD-10-CM | POA: Diagnosis not present

## 2023-03-27 DIAGNOSIS — M25652 Stiffness of left hip, not elsewhere classified: Secondary | ICD-10-CM | POA: Diagnosis not present

## 2023-03-27 DIAGNOSIS — M25551 Pain in right hip: Secondary | ICD-10-CM | POA: Diagnosis not present

## 2023-03-30 DIAGNOSIS — M25551 Pain in right hip: Secondary | ICD-10-CM | POA: Diagnosis not present

## 2023-03-30 DIAGNOSIS — M25651 Stiffness of right hip, not elsewhere classified: Secondary | ICD-10-CM | POA: Diagnosis not present

## 2023-03-30 DIAGNOSIS — M25652 Stiffness of left hip, not elsewhere classified: Secondary | ICD-10-CM | POA: Diagnosis not present

## 2023-03-30 DIAGNOSIS — R29898 Other symptoms and signs involving the musculoskeletal system: Secondary | ICD-10-CM | POA: Diagnosis not present

## 2023-03-30 DIAGNOSIS — M5431 Sciatica, right side: Secondary | ICD-10-CM | POA: Diagnosis not present

## 2023-03-30 DIAGNOSIS — R293 Abnormal posture: Secondary | ICD-10-CM | POA: Diagnosis not present

## 2023-03-30 DIAGNOSIS — R262 Difficulty in walking, not elsewhere classified: Secondary | ICD-10-CM | POA: Diagnosis not present

## 2023-04-06 DIAGNOSIS — M25652 Stiffness of left hip, not elsewhere classified: Secondary | ICD-10-CM | POA: Diagnosis not present

## 2023-04-06 DIAGNOSIS — M25551 Pain in right hip: Secondary | ICD-10-CM | POA: Diagnosis not present

## 2023-04-06 DIAGNOSIS — M5431 Sciatica, right side: Secondary | ICD-10-CM | POA: Diagnosis not present

## 2023-04-06 DIAGNOSIS — R29898 Other symptoms and signs involving the musculoskeletal system: Secondary | ICD-10-CM | POA: Diagnosis not present

## 2023-04-06 DIAGNOSIS — R262 Difficulty in walking, not elsewhere classified: Secondary | ICD-10-CM | POA: Diagnosis not present

## 2023-04-06 DIAGNOSIS — M25651 Stiffness of right hip, not elsewhere classified: Secondary | ICD-10-CM | POA: Diagnosis not present

## 2023-04-06 DIAGNOSIS — R293 Abnormal posture: Secondary | ICD-10-CM | POA: Diagnosis not present

## 2023-04-09 ENCOUNTER — Ambulatory Visit (INDEPENDENT_AMBULATORY_CARE_PROVIDER_SITE_OTHER): Payer: Medicare HMO | Admitting: Medical

## 2023-04-09 VITALS — BP 124/72 | HR 72 | Ht 67.0 in | Wt 153.6 lb

## 2023-04-09 DIAGNOSIS — Z9989 Dependence on other enabling machines and devices: Secondary | ICD-10-CM

## 2023-04-09 DIAGNOSIS — E611 Iron deficiency: Secondary | ICD-10-CM

## 2023-04-09 DIAGNOSIS — Z0000 Encounter for general adult medical examination without abnormal findings: Secondary | ICD-10-CM | POA: Diagnosis not present

## 2023-04-09 DIAGNOSIS — G4701 Insomnia due to medical condition: Secondary | ICD-10-CM

## 2023-04-09 DIAGNOSIS — Z7189 Other specified counseling: Secondary | ICD-10-CM | POA: Insufficient documentation

## 2023-04-09 DIAGNOSIS — R7302 Impaired glucose tolerance (oral): Secondary | ICD-10-CM

## 2023-04-09 DIAGNOSIS — J4540 Moderate persistent asthma, uncomplicated: Secondary | ICD-10-CM

## 2023-04-09 DIAGNOSIS — F40231 Fear of injections and transfusions: Secondary | ICD-10-CM

## 2023-04-09 DIAGNOSIS — E559 Vitamin D deficiency, unspecified: Secondary | ICD-10-CM | POA: Diagnosis not present

## 2023-04-09 DIAGNOSIS — I10 Essential (primary) hypertension: Secondary | ICD-10-CM

## 2023-04-09 DIAGNOSIS — Z789 Other specified health status: Secondary | ICD-10-CM

## 2023-04-09 DIAGNOSIS — D45 Polycythemia vera: Secondary | ICD-10-CM | POA: Diagnosis not present

## 2023-04-09 DIAGNOSIS — Z23 Encounter for immunization: Secondary | ICD-10-CM | POA: Diagnosis not present

## 2023-04-09 DIAGNOSIS — G479 Sleep disorder, unspecified: Secondary | ICD-10-CM | POA: Insufficient documentation

## 2023-04-09 DIAGNOSIS — N1831 Chronic kidney disease, stage 3a: Secondary | ICD-10-CM

## 2023-04-09 DIAGNOSIS — I509 Heart failure, unspecified: Secondary | ICD-10-CM | POA: Diagnosis not present

## 2023-04-09 DIAGNOSIS — F03C0 Unspecified dementia, severe, without behavioral disturbance, psychotic disturbance, mood disturbance, and anxiety: Secondary | ICD-10-CM

## 2023-04-09 DIAGNOSIS — Z7185 Encounter for immunization safety counseling: Secondary | ICD-10-CM

## 2023-04-09 DIAGNOSIS — I5020 Unspecified systolic (congestive) heart failure: Secondary | ICD-10-CM | POA: Diagnosis not present

## 2023-04-09 DIAGNOSIS — Z515 Encounter for palliative care: Secondary | ICD-10-CM

## 2023-04-09 NOTE — Progress Notes (Signed)
Subjective: Chief Complaint  Patient presents with   Medicare Wellness    Nonfasting AWV/CPE. Patient's wife reports that he does not want to do home exercises, only at PT. He can't lay down to sleep so he is having to sit up to sleep. Requesting nebulizer machine.    Here for well visit.  Accompanied by wife [name] who is his caregiver.  Patient Care Team: Rosaleah Person, Kermit Balo, PA-C as PCP - General (Family Medicine) Nahser, Deloris Ping, MD as PCP - Cardiology (Cardiology) [place], Heather, OD (Optometry) Higgs, Minta Balsam, MD (Inactive) as Consulting Physician (Oncology) Ginette Pitman, PT Dr. Carlena Hurl, podiatry Butch Penny, NP , neurology Dr. Willia Craze, orthopedics Palliative care team    Concerns: Wife notes he can't sleep lying.  Feels like he can't breath if lying flat.  Sounds wheezy if lying flat.  This started about 2 weeks ago.  They don't have an adjustable bed.  He has been sleeping in a chair.  In past week or so no other SOB, no chest pain, no worse edema.   Exercise - Walking is limited  Eats 3 times daily  Wears adult diapers.  Wife still mainly helps care for him, wife helps him with transferring, ambulation, toileting.    Been going to physical therapy 1-2 times per week  Wife administers his medicaiton   Past Medical History:  Diagnosis Date   Asthma    CHF (congestive heart failure) (HCC)    Dementia (HCC)    Elevated liver function tests 01/17/2019   Elevated serum creatinine 01/17/2019   Fear of needles 01/17/2019   Former smoker, stopped smoking many years ago 01/17/2019   Hypertension    Polycythemia 01/17/2019   Thrombocytosis 01/17/2019   Current Outpatient Medications on File Prior to Visit  Medication Sig Dispense Refill   ASPIRIN LOW DOSE 81 MG tablet TAKE 1 TABLET EVERY DAY 90 tablet 3   brimonidine (ALPHAGAN) 0.2 % ophthalmic solution SMARTSIG:In Eye(s)     donepezil (ARICEPT) 10 MG tablet Take 1 tablet (10 mg total) by mouth at  bedtime. 30 tablet 11   ferrous gluconate (FERGON) 324 MG tablet Take 1 tablet (324 mg total) by mouth daily with breakfast. 90 tablet 1   fluticasone furoate-vilanterol (BREO ELLIPTA) 200-25 MCG/ACT AEPB Inhale 1 puff into the lungs daily.     furosemide (LASIX) 80 MG tablet Take 1 tablet (80 mg total) by mouth daily. 90 tablet 1   hydroxyurea (HYDREA) 500 MG capsule TAKE 1 CAPSULE TWICE DAILY, MAY TAKE WITH FOOD TO MINIMIZE GI SIDE EFFECTS 180 capsule 0   metoprolol succinate (TOPROL-XL) 25 MG 24 hr tablet TAKE 1 TABLET EVERY DAY 90 tablet 0   Multiple Vitamins-Minerals (MEGA MULTIVITAMIN FOR MEN PO) Take 1 tablet by mouth daily.     OLANZapine (ZYPREXA) 5 MG tablet TAKE 1 TABLET BY MOUTH EVERY NIGHT AT 7 PM 90 tablet 0   potassium chloride (KLOR-CON) 10 MEQ tablet TAKE 1 TABLET EVERY DAY 90 tablet 2   valsartan (DIOVAN) 160 MG tablet TAKE 1 TABLET EVERY DAY 90 tablet 3   albuterol (PROVENTIL) (2.5 MG/3ML) 0.083% nebulizer solution Take 3 mLs (2.5 mg total) by nebulization every 6 (six) hours as needed for wheezing or shortness of breath. (Patient not taking: Reported on 04/09/2023) 150 mL 1   LORazepam (ATIVAN) 0.5 MG tablet Take 1 tablet (0.5 mg total) by mouth 2 (two) times daily. (Patient not taking: Reported on 04/09/2023) 5 tablet 0   nystatin cream (MYCOSTATIN) Apply  1 Application topically as needed. (Patient not taking: Reported on 04/09/2023)     No current facility-administered medications on file prior to visit.    ROS as in subjective    Objective: BP 124/72   Pulse 72   Ht 5\' 7"  (1.702 m)   Wt 153 lb 9.6 oz (69.7 kg)   BMI 24.06 kg/m   Gen: wd, wn, nad Lungs decreased sounds in general, no wheezing, no rhonchi, no rales Back: nontender Heart rrr, normal s1, 2, no numerus No LE edema Pulses 2+ UE and LE Muscle atrophy noted in general of UE and LE, no swelling, seemingly normal ROM of UE and LE Strength 4-5 /5 UE and LE, normal DTRs.    Echocardiogram 02/13/23, low  EF!  IMPRESSIONS   1. Left ventricular ejection fraction, by estimation, is 20 to 25%. Left  ventricular ejection fraction by PLAX is 21 %. The left ventricle has  severely decreased function. The left ventricle demonstrates global  hypokinesis. The left ventricular internal   cavity size was mildly dilated. Left ventricular diastolic parameters are  consistent with Grade II diastolic dysfunction (pseudo normalization).   2. Right ventricular systolic function is normal. The right ventricular  size is normal.   3. Left atrial size was severely dilated.   4. Right atrial size was severely dilated.   5. The mitral valve is normal in structure. Mild mitral valve  regurgitation. No evidence of mitral stenosis.   6. Tricuspid valve regurgitation is mild to moderate.   7. The aortic valve is tricuspid. Aortic valve regurgitation is mild. No  aortic stenosis is present. Aortic regurgitation PHT measures 463 msec.   8. Pulmonic valve regurgitation is moderate.   9. The inferior vena cava is normal in size with greater than 50%  respiratory variability, suggesting right atrial pressure of 3 mmHg.    CT head wo contrast 10/21/22: IMPRESSION: Atrophy and chronic small vessel ischemic changes. No acute intracranial process identified.     Assessment: Encounter Diagnoses  Name Primary?   Encounter for health maintenance examination in adult Yes   Severe dementia, unspecified dementia type, unspecified whether behavioral, psychotic, or mood disturbance or anxiety (HCC)    Primary hypertension    Congestive heart failure, NYHA class 2, unspecified congestive heart failure type (HCC)    Systolic congestive heart failure with reduced left ventricular function, NYHA class 2 (HCC)    Stage 3a chronic kidney disease (HCC)    Vaccine counseling    Vitamin D deficiency    Severe needle phobia    Polycythemia vera (HCC)    Palliative care patient    Iron deficiency    Impaired glucose tolerance     Decreased activities of daily living (ADL)    Dependence on cane    Sleep disturbance    Insomnia due to medical condition    Moderate persistent asthma, unspecified whether complicated     Plan: Dementia-on Aricept, sees neurology.  No recent changes.  Hypertension-continue valsartan 160 mg daily, Lasix 80 mg daily, Toprol XL 25 mg daily  History of heart failure with possible recent orthopnea-reduced ejection fraction.  Continue daily weights, continue Lasix, consider sleeping in recliner or adjustable bed, leg elevation when possible.  Continue routine follow-up with cardiology.  Labs as below  Asthma-doing relatively okay on Breo daily and albuterol as needed  Foodservice, insomnia-counseled on considering a recliner or adjustable bed.  Continue olanzapine 5 mg in the evening.  Thrombocytosis-on hydroxyurea  CKD 3A-updated labs  today  History of iron deficiency-continues on iron daily  Continue with physical therapy weekly   Vaccine counseling: Advised flu shot soon  Counseled on the pneumococcal vaccine.  Vaccine information sheet given.  Pneumococcal vaccine Prevnar 20 given after consent obtained.  [place] was seen today for [employer].  Diagnoses and all orders for this visit:  Encounter for health maintenance examination in adult  Severe dementia, unspecified dementia type, unspecified whether behavioral, psychotic, or mood disturbance or anxiety (HCC)  Primary hypertension -     Renal Function Panel -     CBC -     Hepatic function panel  Congestive heart failure, NYHA class 2, unspecified congestive heart failure type (HCC) -     Brain natriuretic peptide  Systolic congestive heart failure with reduced left ventricular function, NYHA class 2 (HCC) -     Brain natriuretic peptide  Stage 3a chronic kidney disease (HCC) -     Renal Function Panel -     CBC -     Hepatic function panel  Vaccine counseling -     Pneumococcal conjugate vaccine  20-valent (Prevnar 20)  Vitamin D deficiency  Severe needle phobia  Polycythemia vera (HCC)  Palliative care patient  Iron deficiency  Impaired glucose tolerance  Decreased activities of daily living (ADL)  Dependence on cane  Sleep disturbance  Insomnia due to medical condition  Moderate persistent asthma, unspecified whether complicated   F/u pending labs.

## 2023-04-10 LAB — CBC
Hematocrit: 37.8 % (ref 37.5–51.0)
Hemoglobin: 13.2 g/dL (ref 13.0–17.7)
MCH: 40.4 pg — ABNORMAL HIGH (ref 26.6–33.0)
MCHC: 34.9 g/dL (ref 31.5–35.7)
MCV: 116 fL — ABNORMAL HIGH (ref 79–97)
NRBC: 3 % — ABNORMAL HIGH (ref 0–0)
Platelets: 138 10*3/uL — ABNORMAL LOW (ref 150–450)
RBC: 3.27 x10E6/uL — ABNORMAL LOW (ref 4.14–5.80)
RDW: 21.2 % — ABNORMAL HIGH (ref 11.6–15.4)
WBC: 4.3 10*3/uL (ref 3.4–10.8)

## 2023-04-10 LAB — RENAL FUNCTION PANEL
Albumin: 4.3 g/dL (ref 3.8–4.8)
BUN/Creatinine Ratio: 20 (ref 10–24)
BUN: 63 mg/dL — ABNORMAL HIGH (ref 8–27)
CO2: 17 mmol/L — ABNORMAL LOW (ref 20–29)
Calcium: 9.6 mg/dL (ref 8.6–10.2)
Chloride: 99 mmol/L (ref 96–106)
Creatinine, Ser: 3.15 mg/dL — ABNORMAL HIGH (ref 0.76–1.27)
Glucose: 85 mg/dL (ref 70–99)
Phosphorus: 3.7 mg/dL (ref 2.8–4.1)
Potassium: 4.7 mmol/L (ref 3.5–5.2)
Sodium: 140 mmol/L (ref 134–144)
eGFR: 19 mL/min/{1.73_m2} — ABNORMAL LOW (ref >59)

## 2023-04-10 LAB — BRAIN NATRIURETIC PEPTIDE: BNP: >5000 pg/mL — AB (ref 0.0–100.0)

## 2023-04-10 LAB — HEPATIC FUNCTION PANEL
ALT: 17 IU/L (ref 0–44)
AST: 41 IU/L — ABNORMAL HIGH (ref 0–40)
Alkaline Phosphatase: 118 IU/L (ref 44–121)
Bilirubin Total: 2.8 mg/dL — ABNORMAL HIGH (ref 0.0–1.2)
Bilirubin, Direct: 1.42 mg/dL — ABNORMAL HIGH (ref 0.00–0.40)
Total Protein: 7.7 g/dL (ref 6.0–8.5)

## 2023-04-11 ENCOUNTER — Other Ambulatory Visit: Payer: Self-pay | Admitting: Medical

## 2023-04-11 ENCOUNTER — Telehealth: Payer: Self-pay | Admitting: Medical

## 2023-04-11 DIAGNOSIS — M5431 Sciatica, right side: Secondary | ICD-10-CM | POA: Diagnosis not present

## 2023-04-11 DIAGNOSIS — M25651 Stiffness of right hip, not elsewhere classified: Secondary | ICD-10-CM | POA: Diagnosis not present

## 2023-04-11 DIAGNOSIS — M25652 Stiffness of left hip, not elsewhere classified: Secondary | ICD-10-CM | POA: Diagnosis not present

## 2023-04-11 DIAGNOSIS — D75839 Thrombocytosis, unspecified: Secondary | ICD-10-CM

## 2023-04-11 DIAGNOSIS — M25551 Pain in right hip: Secondary | ICD-10-CM | POA: Diagnosis not present

## 2023-04-11 MED ORDER — HYDROXYUREA 500 MG PO CAPS: 500.0000 mg | ORAL_CAPSULE | Freq: Two times a day (BID) | ORAL | 1 refills | Status: DC

## 2023-04-11 MED ORDER — FERROUS GLUCONATE 324 (38 FE) MG PO TABS: 324.0000 mg | ORAL_TABLET | Freq: Every day | ORAL | 1 refills | Status: DC

## 2023-04-11 MED ORDER — POTASSIUM CHLORIDE ER 10 MEQ PO TBCR: 10.0000 meq | EXTENDED_RELEASE_TABLET | Freq: Two times a day (BID) | ORAL | 0 refills | Status: DC

## 2023-04-11 MED ORDER — METOPROLOL SUCCINATE ER 25 MG PO TB24: 25.0000 mg | ORAL_TABLET | Freq: Every day | ORAL | 0 refills | Status: DC

## 2023-04-11 MED ORDER — FUROSEMIDE 80 MG PO TABS: 80.0000 mg | ORAL_TABLET | Freq: Two times a day (BID) | ORAL | 0 refills | Status: DC

## 2023-04-11 MED ORDER — OLANZAPINE 5 MG PO TABS: 5.0000 mg | ORAL_TABLET | Freq: Every day | ORAL | 1 refills | Status: DC

## 2023-04-11 NOTE — Progress Notes (Signed)
Labs unfortunately show kidneys backed up and heart backed up with fluid.  If any worse or shortness of breath or swelling then consider going to the emergency department  Please also schedule follow-up with cardiology  For now, increase Lasix to twice daily to help get off extra fluid from around the heart and to have the kidneys clear out fluid.  Currently you are doing 80 mg daily, but change to twice daily.  Also increase the potassium to twice daily  I need to recheck your kidney and heart marker again on Monday.  Please plan to come in for blood test and recheck Monday

## 2023-04-11 NOTE — Telephone Encounter (Signed)
See other message from today.  Hold the valsartan until Monday as this may be making the kidney marker worse

## 2023-04-11 NOTE — Telephone Encounter (Signed)
Pt's daughter was notified.

## 2023-04-16 DIAGNOSIS — R29898 Other symptoms and signs involving the musculoskeletal system: Secondary | ICD-10-CM | POA: Diagnosis not present

## 2023-04-16 DIAGNOSIS — M5431 Sciatica, right side: Secondary | ICD-10-CM | POA: Diagnosis not present

## 2023-04-16 DIAGNOSIS — M25551 Pain in right hip: Secondary | ICD-10-CM | POA: Diagnosis not present

## 2023-04-16 DIAGNOSIS — R293 Abnormal posture: Secondary | ICD-10-CM | POA: Diagnosis not present

## 2023-04-16 DIAGNOSIS — M25652 Stiffness of left hip, not elsewhere classified: Secondary | ICD-10-CM | POA: Diagnosis not present

## 2023-04-16 DIAGNOSIS — M25651 Stiffness of right hip, not elsewhere classified: Secondary | ICD-10-CM | POA: Diagnosis not present

## 2023-04-16 DIAGNOSIS — R262 Difficulty in walking, not elsewhere classified: Secondary | ICD-10-CM | POA: Diagnosis not present

## 2023-04-18 ENCOUNTER — Ambulatory Visit (INDEPENDENT_AMBULATORY_CARE_PROVIDER_SITE_OTHER): Payer: Medicare HMO | Admitting: Medical

## 2023-04-18 VITALS — BP 104/62 | HR 72 | Wt 152.0 lb

## 2023-04-18 DIAGNOSIS — E559 Vitamin D deficiency, unspecified: Secondary | ICD-10-CM | POA: Diagnosis not present

## 2023-04-18 DIAGNOSIS — R7989 Other specified abnormal findings of blood chemistry: Secondary | ICD-10-CM | POA: Diagnosis not present

## 2023-04-18 DIAGNOSIS — D75839 Thrombocytosis, unspecified: Secondary | ICD-10-CM

## 2023-04-18 DIAGNOSIS — R251 Tremor, unspecified: Secondary | ICD-10-CM | POA: Diagnosis not present

## 2023-04-18 DIAGNOSIS — I10 Essential (primary) hypertension: Secondary | ICD-10-CM | POA: Diagnosis not present

## 2023-04-18 DIAGNOSIS — G479 Sleep disorder, unspecified: Secondary | ICD-10-CM

## 2023-04-18 DIAGNOSIS — I509 Heart failure, unspecified: Secondary | ICD-10-CM | POA: Diagnosis not present

## 2023-04-18 NOTE — Progress Notes (Signed)
Subjective: Chief Complaint  Patient presents with   follow-up    Follow-up on kidney marker, still having swelling in feet. Lasix twice a day and held valsartan   Here with wife and daughter  Here for 1 week.  Last week he came in for recheck, med check and there was concerns for swelling, somewhat lethargic not as peppy as usual, and labs showed some changes in kidney function and the BNP remained high.  Last week we increased Lasix to twice a day 80 mg twice a day which he has been doing and we held the valsartan temporarily given the kidney marker finding.  This week he has been a little more peppy.  He has gotten some fluid weight off but not a lot of change in his weight overall.  The only new concern is sometimes in his sleep he will have a shaking episode like a seizure but not quite seizure-like.  It lasts briefly and by the time they called EMS it has resolved.  He did seem to mumble response when asked the question during 1 of these events and when EMS has come out with 1 of these events his blood sugar was not low.  Family wants to know if we can give him something to help his papular mood to make him a bit more talkative active than just sleeping all the time.  He drinks currently at least 2-1/2 [employer] of water daily.  Since last visit they called to schedule cardiology visit but it will not be till November  He initially was put on Zyprexa and he was on hospice within the past year or 2 to help with sleep and help because he was having hallucinations and seeing people.  Compliant with other medicines in general  No other aggravating or relieving factors. No other complaint.  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 150 mL 1   ASPIRIN LOW DOSE 81 MG tablet TAKE 1 TABLET EVERY DAY 90 tablet 3   brimonidine (ALPHAGAN) 0.2 % ophthalmic solution SMARTSIG:In Eye(s)     donepezil (ARICEPT) 10 MG tablet Take 1 tablet (10 mg total) by mouth at bedtime. 30 tablet 11   ferrous gluconate (FERGON) 324 MG tablet Take 1 tablet (324 mg total) by mouth daily with breakfast. 90 tablet 1   fluticasone furoate-vilanterol (BREO ELLIPTA) 200-25 MCG/ACT AEPB Inhale 1 puff into the lungs daily.     furosemide (LASIX) 80 MG tablet Take 1 tablet (80 mg total) by mouth 2 (two) times daily. 180 tablet 0   hydroxyurea (HYDREA) 500 MG capsule Take 1 capsule (500 mg total) by mouth 2 (two) times daily. May take with food to minimize GI side effects. 180 capsule 1   metoprolol succinate (TOPROL-XL) 25 MG 24 hr tablet Take 1 tablet (25 mg total) by mouth daily. 90 tablet 0   Multiple Vitamins-Minerals (MEGA MULTIVITAMIN FOR MEN PO) Take  1 tablet by mouth daily.     nystatin cream (MYCOSTATIN) Apply 1 Application topically as needed.     OLANZapine (ZYPREXA) 5 MG tablet Take 1 tablet (5 mg total) by mouth at bedtime. 90 tablet 1   potassium chloride (KLOR-CON) 10 MEQ tablet Take 1 tablet (10 mEq total) by mouth 2 (two) times daily. 180 tablet 0   valsartan (DIOVAN) 160 MG tablet TAKE 1 TABLET EVERY DAY (Patient not taking: Reported on 04/18/2023) 90 tablet 3   No current facility-administered medications on file prior to visit.    ROS as in subjective   Objective: BP 104/62   Pulse 72   Wt 152 lb (68.9 kg)   BMI 23.81 kg/m   Gen: wd, wn, nad Heart regular rate rhythm, normal S1-S2, no murmurs Lungs clear, no rhonchi  or rales or wheezing 1+ nonpitting lower extremity edema bilaterally    Assessment: Encounter Diagnoses  Name Primary?   Elevated serum creatinine Yes   Congestive heart failure, NYHA class 2, unspecified congestive heart failure type (HCC)    Primary hypertension    Vitamin D deficiency    Thrombocytosis    Shaking    Sleep disturbance     Plan: Recheck labs today.  Recent BNP was over 5000 similar to prior lab in recent months.  He has known congestive heart failure.  Last week we increased Lasix to 80 mg twice a day up from a milligrams once a day and we increased the potassium which he is doing.  He does not appear particularly overloaded today  Updated lab as below today to recheck the kidney marker  We discussed we might need to make other modifications if his kidney marker still abnormal compared to baseline  History of thrombocytosis-on hydroxyurea, does fine on this  Hypertension, history of heart failure-continue Toprol-XL 25 mg daily and Lasix 80 mg twice daily.  We temporarily held valsartan last week until we get the labs back today, sleep with head of bed elevated  Keep oral intake of fluids consistent  Sleep disturbance-currently on olanzapine.  We discussed possibly increasing this or adding Paxil or something else to help with mood and overall give him a little boost in pep or activity.    James Bryant was seen today for follow-up.  Diagnoses and all orders for this visit:  Elevated serum creatinine -     Basic metabolic panel -     Magnesium  Congestive heart failure, NYHA class 2, unspecified congestive heart failure type (HCC) -     Basic metabolic panel -     Magnesium  Primary hypertension -     Basic metabolic panel -     Magnesium  Vitamin D deficiency -     Basic metabolic panel -     Magnesium  Thrombocytosis -     Basic metabolic panel -     Magnesium  Shaking  Sleep disturbance   Follow-up pending labs

## 2023-04-19 ENCOUNTER — Emergency Department (HOSPITAL_COMMUNITY): Payer: Medicare HMO

## 2023-04-19 ENCOUNTER — Encounter (HOSPITAL_COMMUNITY): Payer: Self-pay

## 2023-04-19 ENCOUNTER — Other Ambulatory Visit: Payer: Self-pay

## 2023-04-19 ENCOUNTER — Inpatient Hospital Stay (HOSPITAL_COMMUNITY)
Admission: EM | Admit: 2023-04-19 | Discharge: 2023-05-03 | DRG: 291 | Disposition: A | Discharge status: Hospice | Payer: Medicare HMO | Attending: Internal Medicine | Admitting: Internal Medicine

## 2023-04-19 DIAGNOSIS — I5084 End stage heart failure: Secondary | ICD-10-CM | POA: Diagnosis present

## 2023-04-19 DIAGNOSIS — E872 Acidosis, unspecified: Secondary | ICD-10-CM | POA: Diagnosis not present

## 2023-04-19 DIAGNOSIS — I130 Hypertensive heart and chronic kidney disease with heart failure and stage 1 through stage 4 chronic kidney disease, or unspecified chronic kidney disease: Secondary | ICD-10-CM | POA: Diagnosis not present

## 2023-04-19 DIAGNOSIS — R570 Cardiogenic shock: Secondary | ICD-10-CM | POA: Diagnosis not present

## 2023-04-19 DIAGNOSIS — R627 Adult failure to thrive: Secondary | ICD-10-CM | POA: Diagnosis present

## 2023-04-19 DIAGNOSIS — M25462 Effusion, left knee: Secondary | ICD-10-CM | POA: Diagnosis not present

## 2023-04-19 DIAGNOSIS — I5043 Acute on chronic combined systolic (congestive) and diastolic (congestive) heart failure: Secondary | ICD-10-CM | POA: Diagnosis present

## 2023-04-19 DIAGNOSIS — J9811 Atelectasis: Secondary | ICD-10-CM | POA: Diagnosis present

## 2023-04-19 DIAGNOSIS — N1831 Chronic kidney disease, stage 3a: Secondary | ICD-10-CM | POA: Diagnosis not present

## 2023-04-19 DIAGNOSIS — K767 Hepatorenal syndrome: Secondary | ICD-10-CM | POA: Diagnosis not present

## 2023-04-19 DIAGNOSIS — I471 Supraventricular tachycardia, unspecified: Secondary | ICD-10-CM | POA: Diagnosis not present

## 2023-04-19 DIAGNOSIS — F03C0 Unspecified dementia, severe, without behavioral disturbance, psychotic disturbance, mood disturbance, and anxiety: Secondary | ICD-10-CM | POA: Diagnosis not present

## 2023-04-19 DIAGNOSIS — I428 Other cardiomyopathies: Secondary | ICD-10-CM | POA: Diagnosis present

## 2023-04-19 DIAGNOSIS — Z515 Encounter for palliative care: Secondary | ICD-10-CM

## 2023-04-19 DIAGNOSIS — I517 Cardiomegaly: Secondary | ICD-10-CM | POA: Diagnosis not present

## 2023-04-19 DIAGNOSIS — N179 Acute kidney failure, unspecified: Secondary | ICD-10-CM

## 2023-04-19 DIAGNOSIS — I509 Heart failure, unspecified: Secondary | ICD-10-CM | POA: Diagnosis not present

## 2023-04-19 DIAGNOSIS — Z87891 Personal history of nicotine dependence: Secondary | ICD-10-CM | POA: Diagnosis not present

## 2023-04-19 DIAGNOSIS — Z79899 Other long term (current) drug therapy: Secondary | ICD-10-CM | POA: Diagnosis not present

## 2023-04-19 DIAGNOSIS — Z7189 Other specified counseling: Secondary | ICD-10-CM

## 2023-04-19 DIAGNOSIS — F03C18 Unspecified dementia, severe, with other behavioral disturbance: Secondary | ICD-10-CM | POA: Diagnosis not present

## 2023-04-19 DIAGNOSIS — D45 Polycythemia vera: Secondary | ICD-10-CM | POA: Diagnosis present

## 2023-04-19 DIAGNOSIS — I5082 Biventricular heart failure: Secondary | ICD-10-CM | POA: Diagnosis present

## 2023-04-19 DIAGNOSIS — J45909 Unspecified asthma, uncomplicated: Secondary | ICD-10-CM | POA: Diagnosis present

## 2023-04-19 DIAGNOSIS — Z8249 Family history of ischemic heart disease and other diseases of the circulatory system: Secondary | ICD-10-CM

## 2023-04-19 DIAGNOSIS — I10 Essential (primary) hypertension: Secondary | ICD-10-CM | POA: Diagnosis not present

## 2023-04-19 DIAGNOSIS — Z7951 Long term (current) use of inhaled steroids: Secondary | ICD-10-CM

## 2023-04-19 DIAGNOSIS — J90 Pleural effusion, not elsewhere classified: Secondary | ICD-10-CM | POA: Diagnosis not present

## 2023-04-19 DIAGNOSIS — R0902 Hypoxemia: Secondary | ICD-10-CM | POA: Diagnosis not present

## 2023-04-19 DIAGNOSIS — Z7982 Long term (current) use of aspirin: Secondary | ICD-10-CM

## 2023-04-19 DIAGNOSIS — E8770 Fluid overload, unspecified: Secondary | ICD-10-CM | POA: Diagnosis not present

## 2023-04-19 DIAGNOSIS — E875 Hyperkalemia: Secondary | ICD-10-CM | POA: Diagnosis not present

## 2023-04-19 DIAGNOSIS — R918 Other nonspecific abnormal finding of lung field: Secondary | ICD-10-CM | POA: Diagnosis not present

## 2023-04-19 DIAGNOSIS — E871 Hypo-osmolality and hyponatremia: Secondary | ICD-10-CM | POA: Diagnosis not present

## 2023-04-19 DIAGNOSIS — E861 Hypovolemia: Secondary | ICD-10-CM | POA: Diagnosis not present

## 2023-04-19 DIAGNOSIS — F03C11 Unspecified dementia, severe, with agitation: Secondary | ICD-10-CM | POA: Diagnosis not present

## 2023-04-19 HISTORY — DX: Chronic kidney disease, stage 3 unspecified: N18.30

## 2023-04-19 LAB — BASIC METABOLIC PANEL
Anion gap: 13 (ref 5–15)
BUN/Creatinine Ratio: 20 (ref 10–24)
BUN: 57 mg/dL — ABNORMAL HIGH (ref 8–27)
BUN: 57 mg/dL — ABNORMAL HIGH (ref 8–23)
CO2: 21 mmol/L (ref 20–29)
CO2: 23 mmol/L (ref 22–32)
Calcium: 9.4 mg/dL (ref 8.6–10.2)
Calcium: 9 mg/dL (ref 8.9–10.3)
Chloride: 101 mmol/L (ref 96–106)
Chloride: 101 mmol/L (ref 98–111)
Creatinine, Ser: 2.92 mg/dL — ABNORMAL HIGH (ref 0.76–1.27)
Creatinine, Ser: 3.07 mg/dL — ABNORMAL HIGH (ref 0.61–1.24)
GFR, Estimated: 20 mL/min — ABNORMAL LOW (ref >60)
Glucose, Bld: 104 mg/dL — ABNORMAL HIGH (ref 70–99)
Glucose: 84 mg/dL (ref 70–99)
Potassium: 4.8 mmol/L (ref 3.5–5.2)
Potassium: 4.8 mmol/L (ref 3.5–5.1)
Sodium: 141 mmol/L (ref 134–144)
Sodium: 137 mmol/L (ref 135–145)
eGFR: 21 mL/min/{1.73_m2} — ABNORMAL LOW (ref >59)

## 2023-04-19 LAB — CBC
HCT: 33 % — ABNORMAL LOW (ref 39.0–52.0)
Hemoglobin: 11 g/dL — ABNORMAL LOW (ref 13.0–17.0)
MCH: 42 pg — ABNORMAL HIGH (ref 26.0–34.0)
MCHC: 33.3 g/dL (ref 30.0–36.0)
MCV: 126 fL — ABNORMAL HIGH (ref 80.0–100.0)
Platelets: 144 10*3/uL — ABNORMAL LOW (ref 150–400)
RBC: 2.62 MIL/uL — ABNORMAL LOW (ref 4.22–5.81)
RDW: 22.6 % — ABNORMAL HIGH (ref 11.5–15.5)
WBC: 4.2 10*3/uL (ref 4.0–10.5)
nRBC: 1.7 % — ABNORMAL HIGH (ref 0.0–0.2)

## 2023-04-19 LAB — MAGNESIUM: Magnesium: 2.6 mg/dL — ABNORMAL HIGH (ref 1.6–2.3)

## 2023-04-19 LAB — BRAIN NATRIURETIC PEPTIDE: B Natriuretic Peptide: >4500 pg/mL — ABNORMAL HIGH (ref 0.0–100.0)

## 2023-04-19 MED ORDER — FUROSEMIDE 10 MG/ML IJ SOLN
80.0000 mg | Freq: Once | INTRAMUSCULAR | Status: AC
  Administered 2023-04-19: 80 mg via INTRAVENOUS
  Filled 2023-04-19: qty 8

## 2023-04-19 MED ORDER — ACETAMINOPHEN 650 MG RE SUPP: 650.0000 mg | Freq: Four times a day (QID) | RECTAL | Status: DC | PRN

## 2023-04-19 MED ORDER — ENOXAPARIN SODIUM 30 MG/0.3ML IJ SOSY
30.0000 mg | PREFILLED_SYRINGE | INTRAMUSCULAR | Status: DC
  Administered 2023-04-19 – 2023-05-02 (×14): 30 mg via SUBCUTANEOUS
  Filled 2023-04-19 (×14): qty 0.3

## 2023-04-19 MED ORDER — METOPROLOL SUCCINATE ER 25 MG PO TB24
25.0000 mg | ORAL_TABLET | Freq: Every day | ORAL | Status: DC
  Administered 2023-04-20 – 2023-04-29 (×10): 25 mg via ORAL
  Filled 2023-04-19 (×10): qty 1

## 2023-04-19 MED ORDER — HYDROXYUREA 500 MG PO CAPS
500.0000 mg | ORAL_CAPSULE | Freq: Two times a day (BID) | ORAL | Status: DC
  Administered 2023-04-19 – 2023-05-03 (×28): 500 mg via ORAL
  Filled 2023-04-19 (×29): qty 1

## 2023-04-19 MED ORDER — FUROSEMIDE 10 MG/ML IJ SOLN
80.0000 mg | Freq: Two times a day (BID) | INTRAMUSCULAR | Status: DC
  Administered 2023-04-20 (×2): 80 mg via INTRAVENOUS
  Filled 2023-04-19: qty 8

## 2023-04-19 MED ORDER — ALBUTEROL SULFATE (2.5 MG/3ML) 0.083% IN NEBU
2.5000 mg | INHALATION_SOLUTION | Freq: Four times a day (QID) | RESPIRATORY_TRACT | Status: DC | PRN
  Administered 2023-04-23: 2.5 mg via RESPIRATORY_TRACT
  Filled 2023-04-19: qty 3

## 2023-04-19 MED ORDER — DONEPEZIL HCL 10 MG PO TABS
10.0000 mg | ORAL_TABLET | Freq: Every day | ORAL | Status: DC
  Administered 2023-04-19 – 2023-05-02 (×14): 10 mg via ORAL
  Filled 2023-04-19 (×14): qty 1

## 2023-04-19 MED ORDER — ACETAMINOPHEN 325 MG PO TABS
650.0000 mg | ORAL_TABLET | Freq: Four times a day (QID) | ORAL | Status: DC | PRN
  Administered 2023-04-22 – 2023-05-02 (×4): 650 mg via ORAL
  Filled 2023-04-19 (×4): qty 2

## 2023-04-19 MED ORDER — HALOPERIDOL LACTATE 5 MG/ML IJ SOLN
2.5000 mg | Freq: Once | INTRAMUSCULAR | Status: AC
  Administered 2023-04-19: 2.5 mg via INTRAVENOUS
  Filled 2023-04-19: qty 1

## 2023-04-19 MED ORDER — SODIUM CHLORIDE 0.9% FLUSH
3.0000 mL | Freq: Two times a day (BID) | INTRAVENOUS | Status: DC
  Administered 2023-04-19 – 2023-05-03 (×28): 3 mL via INTRAVENOUS

## 2023-04-19 MED ORDER — OLANZAPINE 5 MG PO TABS
5.0000 mg | ORAL_TABLET | Freq: Every day | ORAL | Status: DC
  Administered 2023-04-19 – 2023-05-02 (×14): 5 mg via ORAL
  Filled 2023-04-19 (×15): qty 1

## 2023-04-19 MED ORDER — POLYETHYLENE GLYCOL 3350 17 G PO PACK: 17.0000 g | PACK | Freq: Every day | ORAL | Status: DC | PRN

## 2023-04-19 NOTE — Progress Notes (Signed)
Heart Failure Navigator Progress Note  Assessed for Heart & Vascular TOC clinic readiness.   Patient does not meet criteria due to history of dementia. Will not schedule HF TOC appt at this time.  Sharen Hones, PharmD, BCPS Heart Failure Stewardship Pharmacist Phone [phone]

## 2023-04-19 NOTE — ED Provider Notes (Signed)
[place] EMERGENCY DEPARTMENT AT [facility] Provider Note   CSN: [id number] Arrival date & time: 04/19/23  1055     History  Chief Complaint  Patient presents with   Abnormal Lab    James Bryant is a 77 y.o. male.  James Bryant is a 77 y.o. male with history of dementia, CHF, hypertension, CKD, who presents to the emergency department for evaluation of elevated creatinine and BNP.  Patient was initially seen by primary care on 8/19 for evaluation of fatigue and lower extremity swelling.  At that time lab work showed AKI with creatinine increasing from baseline of 1.5 to 3.15.  PCP tried doubling Lasix for a total of 80 mg daily and patient has been urinating more but despite this he continues to have lower extremity edema and yesterday he had recheck of his labs which showed creatinine remains around 3.07.  BNP 10 days ago was > 5000.  Patient reports increased shortness of breath when he lays flat to sleep.  PCP sent in with concern for persistently elevated creatinine and need for IV diuresis.  Underlying history of dementia.  Daughter and wife at bedside.  The history is provided by the patient, a relative, the spouse and medical records.  Abnormal Lab      Home Medications Prior to Admission medications   Medication Sig Start Date End Date Taking? Authorizing Provider  albuterol (PROVENTIL) (2.5 MG/3ML) 0.083% nebulizer solution Take 3 mLs (2.5 mg total) by nebulization every 6 (six) hours as needed for wheezing or shortness of breath. 09/06/22   Tysinger, Kermit Balo, PA-C  ASPIRIN LOW DOSE 81 MG tablet TAKE 1 TABLET EVERY DAY 09/29/22   Tysinger, Kermit Balo, PA-C  brimonidine [facility]) 0.2 % ophthalmic solution SMARTSIG:In Eye(s) 07/17/22   [provider]  donepezil (ARICEPT) 10 MG tablet Take 1 tablet (10 mg total) by mouth at bedtime. 12/14/22   Butch Penny, NP  ferrous gluconate (FERGON) 324 MG tablet Take 1 tablet (324 mg total) by mouth daily with  breakfast. 04/11/23   Tysinger, Kermit Balo, PA-C  fluticasone furoate-vilanterol (BREO ELLIPTA) 200-25 MCG/ACT AEPB Inhale 1 puff into the lungs daily.    [provider]  furosemide (LASIX) 80 MG tablet Take 1 tablet (80 mg total) by mouth 2 (two) times daily. 04/11/23   Tysinger, Kermit Balo, PA-C  hydroxyurea (HYDREA) 500 MG capsule Take 1 capsule (500 mg total) by mouth 2 (two) times daily. May take with food to minimize GI side effects. 04/11/23   Tysinger, Kermit Balo, PA-C  metoprolol succinate (TOPROL-XL) 25 MG 24 hr tablet Take 1 tablet (25 mg total) by mouth daily. 04/11/23   Tysinger, Kermit Balo, PA-C  Multiple Vitamins-Minerals (MEGA MULTIVITAMIN FOR MEN PO) Take 1 tablet by mouth daily.    [provider]  nystatin cream (MYCOSTATIN) Apply 1 Application topically as needed. 07/26/22   [provider]  OLANZapine (ZYPREXA) 5 MG tablet Take 1 tablet (5 mg total) by mouth at bedtime. 04/11/23   Tysinger, Kermit Balo, PA-C  potassium chloride (KLOR-CON) 10 MEQ tablet Take 1 tablet (10 mEq total) by mouth 2 (two) times daily. 04/11/23   Tysinger, Kermit Balo, PA-C  valsartan (DIOVAN) 160 MG tablet TAKE 1 TABLET EVERY DAY Patient not taking: Reported on 04/18/2023 12/19/22   Dyann Kief, PA-C      Allergies    Patient has no known allergies.    Review of Systems   Review of Systems  Constitutional:  Negative for chills and fever.  HENT: Negative.    Respiratory:  Positive for shortness of breath. Negative for cough.   Cardiovascular:  Positive for leg swelling. Negative for chest pain and palpitations.  All other systems reviewed and are negative.   Physical Exam Updated Vital Signs BP 120/72 (BP Location: Right Arm)   Pulse 65   Temp 98.5 F (36.9 C) (Oral)   Resp 16   Ht 5\' 7"  (1.702 m)   Wt 68.9 kg   SpO2 100%   BMI 23.81 kg/m  Physical Exam Vitals and nursing note reviewed.  Constitutional:      General: He is not in acute distress.    Appearance: Normal  appearance. He is well-developed. He is not diaphoretic.  HENT:     Head: Normocephalic and atraumatic.  Eyes:     General:        Right eye: No discharge.        Left eye: No discharge.     Pupils: Pupils are equal, round, and reactive to light.  Cardiovascular:     Rate and Rhythm: Normal rate and regular rhythm.     Pulses: Normal pulses.     Heart sounds: Normal heart sounds.  Pulmonary:     Effort: Pulmonary effort is normal. No respiratory distress.     Breath sounds: Normal breath sounds. No wheezing or rales.     Comments: Respirations equal and unlabored, patient able to speak in full sentences, lungs clear to auscultation bilaterally  Abdominal:     General: Bowel sounds are normal. There is no distension.     Palpations: Abdomen is soft. There is no mass.     Tenderness: There is no abdominal tenderness. There is no guarding.     Comments: Abdomen soft, nondistended, nontender to palpation in all quadrants without guarding or peritoneal signs  Musculoskeletal:        General: No deformity.     Cervical back: Neck supple.     Right lower leg: Edema present.     Left lower leg: Edema present.     Comments: 2+ pitting edema to the knee bilaterally  Skin:    General: Skin is warm and dry.     Capillary Refill: Capillary refill takes less than 2 seconds.  Neurological:     Mental Status: He is alert and oriented to person, place, and time.     Coordination: Coordination normal.     Comments: Speech is clear, able to follow commands Moves extremities without ataxia, coordination intact  Psychiatric:        Mood and Affect: Mood normal.        Behavior: Behavior normal.     ED Results / Procedures / Treatments   Labs (all labs ordered are listed, but only abnormal results are displayed) Labs Reviewed  BASIC METABOLIC PANEL - Abnormal; Notable for the following components:      Result Value   Glucose, Bld 104 (*)    BUN 57 (*)    Creatinine, Ser 3.07 (*)    GFR,  Estimated 20 (*)    All other components within normal limits  CBC  BRAIN NATRIURETIC PEPTIDE    EKG EKG Interpretation Date/Time:  Thursday April 19 2023 11:40:59 EDT Ventricular Rate:  70 PR Interval:  136 QRS Duration:  96 QT Interval:  424 QTC Calculation: 457 R Axis:   -37  Text Interpretation: Normal sinus rhythm Left axis deviation Cannot rule out Anterior infarct , age  undetermined ST & T wave abnormality, consider lateral ischemia Abnormal ECG When compared with ECG of 21-Oct-2022 15:08, PREVIOUS ECG IS PRESENT Confirmed by Kristine Royal [phone]) on 04/19/2023 2:45:07 PM  Radiology DG Chest 2 View  Result Date: 04/19/2023 CLINICAL DATA:  CHF. EXAM: CHEST - 2 VIEW COMPARISON:  10/21/2022 FINDINGS: Low volume film. The cardio pericardial silhouette is enlarged. Streaky opacity at the lung bases suggest atelectasis. No overt edema or substantial pleural effusion. Bones are diffusely demineralized. IMPRESSION: Low volume film with bibasilar atelectasis. Electronically Signed   By: Kennith Center M.D.   On: 04/19/2023 12:26    Procedures Procedures    Medications Ordered in ED Medications  furosemide (LASIX) injection 80 mg (has no administration in time range)    ED Course/ Medical Decision Making/ A&P                                 Medical Decision Making Amount and/or Complexity of Data Reviewed Labs: ordered. Radiology: ordered.  Risk Prescription drug management. Decision regarding hospitalization.   77 year old male presents at the recommendation of his PCP for evaluation of AKI and fluid overload.  Was initially seen 10 days ago and found to have elevated creatinine and a BNP > 5000.  Despite doubling Lasix for a week patient continues to have elevated creatinine on recheck of labs yesterday and continues to have lower extremity edema.  PCP sent into the ED for IV diuresis and further evaluation.  Patient does report some orthopnea and worsening shortness of  breath at night.  Creatinine today at 3.07 continues to be increased from baseline of 1.5.  Has lower extremity edema.  CBC and repeat BNP pending but CBC obtained yesterday was stable.  Chest x-ray with some bibasilar atelectasis but no significant pulmonary edema.  Will order IV Lasix and consult hospitalist for admission.  Case discussed with Dr. Ginette Otto who will see and admit the patient.        Final Clinical Impression(s) / ED Diagnoses Final diagnoses:  AKI (acute kidney injury) (HCC)  Hypervolemia, unspecified hypervolemia type    Rx / DC Orders ED Discharge Orders     None         Legrand Rams 04/19/23 1519    Wynetta Fines, MD 04/19/23 [phone]

## 2023-04-19 NOTE — H&P (Signed)
History and Physical   James Bryant QQV:[id number] DOB: 1946/07/05 DOA: 04/19/2023  PCP: Jac Canavan, PA-C   Patient coming from: Home  Chief Complaint: Abnormal labs, edema, lethargy  HPI: James Bryant is a 77 y.o. male with medical history significant of dementia, CKD 3A, hypertension, chronic combined systolic and diastolic CHF, needle phobia presenting with abnormal labs, edema, lethargy.   history obtained with assistance of chart review and family due to patient's dementia.  Patient has had some issues with edema, weight gain, lethargy for the past 1 to 2 weeks.  Was seen at PCP office on 8/19.  At that time noted to have elevated creatinine and elevated BNP to greater than 5000.  Lasix dose was increased from 80 mg daily to 80 mg twice daily.  Some improvement in fluid status but continues to have significantly elevated creatinine and was sent to the ED for further evaluation and IV diuresis.    Unable to obtain full review of systems due to patient's dementia.  No reported fevers, chills, chest pain, abdominal pain, constipation, diarrhea.  ED Course: Vital signs in the ED stable.  Lab workup included BMP with BUN 57, creatinine elevated to 3.07 which is similar to 10 days ago but increased from baseline of 1.63 months ago.  Magnesium was last checked yesterday and was 2.6.  CBC is pending.  BNP is pending but was last checked on 8/19 as above and was greater than 5000.  Chest x-ray showed poor inspiration and atelectasis.  Patient received dose of IV Lasix in the ED.  Review of Systems: As per HPI otherwise all other systems reviewed and are negative.  Past Medical History:  Diagnosis Date   Asthma    Calculus of gallbladder without cholecystitis without obstruction 10/18/2022   CHF (congestive heart failure) (HCC)    Dementia (HCC)    Elevated liver function tests 01/17/2019   Elevated serum creatinine 01/17/2019   Fear of needles 01/17/2019   Former smoker, stopped  smoking many years ago 01/17/2019   Hypertension    Polycythemia 01/17/2019   Thrombocytosis 01/17/2019    History reviewed. No pertinent surgical history.  Social History  reports that he has quit smoking. He has never used smokeless tobacco. He reports that he does not drink alcohol and does not use drugs.  No Known Allergies  Family History  Problem Relation Age of Onset   Heart disease Mother    Hypertension Mother    Alzheimer's disease Neg Hx    Dementia Neg Hx   Reviewed on admission  Prior to Admission medications   Medication Sig Start Date End Date Taking? Authorizing Provider  albuterol (PROVENTIL) (2.5 MG/3ML) 0.083% nebulizer solution Take 3 mLs (2.5 mg total) by nebulization every 6 (six) hours as needed for wheezing or shortness of breath. 09/06/22  Yes Tysinger, Kermit Balo, PA-C  ASPIRIN LOW DOSE 81 MG tablet TAKE 1 TABLET EVERY DAY 09/29/22  Yes Tysinger, Kermit Balo, PA-C  donepezil (ARICEPT) 10 MG tablet Take 1 tablet (10 mg total) by mouth at bedtime. 12/14/22  Yes Butch Penny, NP  ferrous gluconate (FERGON) 324 MG tablet Take 1 tablet (324 mg total) by mouth daily with breakfast. 04/11/23  Yes Tysinger, Kermit Balo, PA-C  fluticasone furoate-vilanterol (BREO ELLIPTA) 200-25 MCG/ACT AEPB Inhale 1 puff into the lungs daily.   Yes [provider]  furosemide (LASIX) 80 MG tablet Take 1 tablet (80 mg total) by mouth 2 (two) times daily. 04/11/23  Yes Tysinger,  Kermit Balo, PA-C  hydroxyurea (HYDREA) 500 MG capsule Take 1 capsule (500 mg total) by mouth 2 (two) times daily. May take with food to minimize GI side effects. 04/11/23  Yes Tysinger, Kermit Balo, PA-C  metoprolol succinate (TOPROL-XL) 25 MG 24 hr tablet Take 1 tablet (25 mg total) by mouth daily. 04/11/23  Yes Tysinger, Kermit Balo, PA-C  Multiple Vitamins-Minerals (MEGA MULTIVITAMIN FOR MEN PO) Take 1 tablet by mouth daily.   Yes [provider]  nystatin cream (MYCOSTATIN) Apply 1 Application topically as needed.  07/26/22  Yes [provider]  OLANZapine (ZYPREXA) 5 MG tablet Take 1 tablet (5 mg total) by mouth at bedtime. 04/11/23  Yes Tysinger, Kermit Balo, PA-C  potassium chloride (KLOR-CON) 10 MEQ tablet Take 1 tablet (10 mEq total) by mouth 2 (two) times daily. 04/11/23  Yes Tysinger, Kermit Balo, PA-C  brimonidine (ALPHAGAN) 0.2 % ophthalmic solution SMARTSIG:In Eye(s) Patient not taking: Reported on 04/19/2023 07/17/22   [provider]  valsartan (DIOVAN) 160 MG tablet TAKE 1 TABLET EVERY DAY Patient not taking: Reported on 04/19/2023 12/19/22   Dyann Kief, PA-C    Physical Exam: Vitals:   04/19/23 1109 04/19/23 1123 04/19/23 1455  BP: 104/82  120/72  Pulse: 66  65  Resp: 17  16  Temp: 98.5 F (36.9 C)  98.5 F (36.9 C)  TempSrc: Oral  Oral  SpO2: 100%  100%  Weight:  68.9 kg   Height:  5\' 7"  (1.702 m)     Physical Exam Constitutional:      General: He is not in acute distress.    Appearance: Normal appearance.  HENT:     Head: Normocephalic and atraumatic.     Mouth/Throat:     Mouth: Mucous membranes are moist.     Pharynx: Oropharynx is clear.  Eyes:     Extraocular Movements: Extraocular movements intact.     Pupils: Pupils are equal, round, and reactive to light.  Cardiovascular:     Rate and Rhythm: Normal rate and regular rhythm.     Pulses: Normal pulses.     Heart sounds: Normal heart sounds.  Pulmonary:     Effort: Pulmonary effort is normal. No respiratory distress.     Breath sounds: Rales present.  Abdominal:     General: Bowel sounds are normal. There is no distension.     Palpations: Abdomen is soft.     Tenderness: There is no abdominal tenderness.  Musculoskeletal:        General: No swelling or deformity.     Right lower leg: Edema present.     Left lower leg: Edema present.  Skin:    General: Skin is warm and dry.  Neurological:     General: No focal deficit present.     Mental Status: Mental status is at baseline.    Labs on  Admission: I have personally reviewed following labs and imaging studies  CBC: No results for input(s): "WBC", "NEUTROABS", "HGB", "HCT", "MCV", "PLT" in the last 168 hours.  Basic Metabolic Panel: Recent Labs  Lab 04/18/23 1247 04/19/23 1126  NA 141 137  K 4.8 4.8  CL 101 101  CO2 21 23  GLUCOSE 84 104*  BUN 57* 57*  CREATININE 2.92* 3.07*  CALCIUM 9.4 9.0  MG 2.6*  --     GFR: Estimated Creatinine Clearance: 18.2 mL/min (A) (by C-G formula based on SCr of 3.07 mg/dL (H)).  Liver Function Tests: No results for input(s): "AST", "ALT", "  ALKPHOS", "BILITOT", "PROT", "ALBUMIN" in the last 168 hours.  Urine analysis:    Component Value Date/Time   COLORURINE AMBER (A) 10/21/2022 1622   APPEARANCEUR CLEAR 10/21/2022 1622   APPEARANCEUR Cloudy (A) 08/31/2022 1111   LABSPEC 1.014 10/21/2022 1622   LABSPEC 1.015 04/06/2022 1307   PHURINE 6.0 10/21/2022 1622   GLUCOSEU NEGATIVE 10/21/2022 1622   HGBUR SMALL (A) 10/21/2022 1622   BILIRUBINUR SMALL (A) 10/21/2022 1622   BILIRUBINUR CANCELED 08/31/2022 1111   KETONESUR NEGATIVE 10/21/2022 1622   PROTEINUR 30 (A) 10/21/2022 1622   NITRITE NEGATIVE 10/21/2022 1622   LEUKOCYTESUR NEGATIVE 10/21/2022 1622    Radiological Exams on Admission: DG Chest 2 View  Result Date: 04/19/2023 CLINICAL DATA:  CHF. EXAM: CHEST - 2 VIEW COMPARISON:  10/21/2022 FINDINGS: Low volume film. The cardio pericardial silhouette is enlarged. Streaky opacity at the lung bases suggest atelectasis. No overt edema or substantial pleural effusion. Bones are diffusely demineralized. IMPRESSION: Low volume film with bibasilar atelectasis. Electronically Signed   By: Kennith Center M.D.   On: 04/19/2023 12:26    EKG: Independently reviewed.  Sinus rhythm at 70 bpm.  Nonspecific T wave flattening.  Assessment/Plan Principal Problem:   Acute on chronic combined systolic (congestive) and diastolic (congestive) heart failure (HCC) Active Problems:   Primary  hypertension   Severe dementia (HCC)   Acute renal failure superimposed on stage 3a chronic kidney disease (HCC)   Acute on chronic combined systolic and diastolic CHF > Has 1 to 2 weeks of weight gain, edema, lethargy.  PCP attempted to treat this outpatient with doubling of Lasix dose but this was only minimally successful.  Sent to the ED for further evaluation. > BNP here is pending but was checked on 8/19 at PCP office and was greater than 5000.  Creatinine elevated to 3.07 as below.  > IV Lasix given in ED. - Monitor on progressive unit for now - Strict I's and O's, daily weights - Hold off on repeat echocardiogram as this was done in June.  At that time EF 20-25%, G2 DD, normal RV function. - Recheck magnesium in the morning - Trend renal function and electrolytes - Continue to hold ARB - Continue metoprolol  AKI on CKD 3a > In the setting of CHF exacerbation as above.  Creatinine 3.07 has been around 3 for the last 10 days.  Baseline is 1.63 months ago. - Continue with diuresis as above - Trend renal function and electrolytes  Dementia > History of behavior disturbance, has agitation more when not at home.  Family reports has responded to Haldol in the past. - Continue home Aricept - Continue home Zyprexa - As needed Haldol can be added if significant agitation  Hypertension - On Lasix as above, currently low normal blood pressure - Holding valsartan - Continue home metoprolol  Needle phobia - Noted  Polycythemia - Continue home hydroxyurea  DVT prophylaxis: Lovenox Code Status:   Full Family Communication:  Updated at bedside Disposition Plan:   Patient is from:  Home  Anticipated DC to:  Home  Anticipated DC date:  1 to 5 days  Anticipated DC barriers: None  Consults called:  None Admission status:  Observation, progressive  Severity of Illness: The appropriate patient status for this patient is OBSERVATION. Observation status is judged to be reasonable and  necessary in order to provide the required intensity of service to ensure the patient's safety. The patient's presenting symptoms, physical exam findings, and initial radiographic and laboratory data  in the context of their medical condition is felt to place them at decreased risk for further clinical deterioration. Furthermore, it is anticipated that the patient will be medically stable for discharge from the hospital within 2 midnights of admission.    Synetta Fail MD [facility]  How to contact the [facility] Attending or Consulting provider 7A - 7P or covering provider during after hours 7P -7A, for this patient?   Check the care team in [facility] and look for a) attending/consulting [facility] provider listed and b) the [facility] team listed Log into [email or web address] and use [place]'s universal password to access. If you do not have the password, please contact the hospital operator. Locate the [facility] provider you are looking for under [facility] and page to a number that you can be directly reached. If you still have difficulty reaching the provider, please page the [facility] (Director on Call) for the Hospitalists listed on amion for assistance.  04/19/2023, 3:07 PM

## 2023-04-19 NOTE — Progress Notes (Signed)
Unfortunately his kidney markers are still backed up presumably due to heart failure.  I would like family to take him to the hospital for evaluation and possible admission for heart failure and acute kidney injury as well  I was hoping the changes last week would help improve on this but it does not look like it has  I recommend [facility].

## 2023-04-19 NOTE — ED Triage Notes (Signed)
Patient had blood work yesterday and doctor called bc his bnp >5000 and sent here for admission and diuresis.  Patient denies cp sob

## 2023-04-19 NOTE — ED Notes (Signed)
ED TO INPATIENT HANDOFF REPORT  ED Nurse Name and Phone #: Beatris Ship RN [phone]  S Name/Age/Gender James Bryant 77 y.o. male Room/Bed: 028C/028C  Code Status   Code Status: Full Code  Home/SNF/Other Home Patient oriented to: self and situation Is this baseline? Yes   Triage Complete: Triage complete  Chief Complaint Acute on chronic combined systolic (congestive) and diastolic (congestive) heart failure (HCC) [I50.43]  Triage Note Patient had blood work yesterday and doctor called bc his bnp >5000 and sent here for admission and diuresis.  Patient denies cp sob    Allergies No Known Allergies  Level of Care/Admitting Diagnosis ED Disposition     ED Disposition  Admit   Condition  --   Comment  Hospital Area: [facility] [[id number]]  Level of Care: Progressive [102]  Admit to Progressive based on following criteria: CARDIOVASCULAR & THORACIC of moderate stability with acute coronary syndrome symptoms/low risk myocardial infarction/hypertensive urgency/arrhythmias/heart failure potentially compromising stability and stable post cardiovascular intervention patients.  May place patient in observation at [facility] or [facility] if equivalent level of care is available:: No  Covid Evaluation: Asymptomatic - no recent exposure (last 10 days) testing not required  Diagnosis: Acute on chronic combined systolic (congestive) and diastolic (congestive) heart failure [facility]) [[id number]]  Admitting Physician: Synetta Fail [[id number]]  Attending Physician: Synetta Fail [[id number]]          B Medical/Surgery History Past Medical History:  Diagnosis Date   Asthma    Calculus of gallbladder without cholecystitis without obstruction 10/18/2022   CHF (congestive heart failure) (HCC)    Dementia (HCC)    Elevated liver function tests 01/17/2019   Elevated serum creatinine 01/17/2019   Fear of needles 01/17/2019   Former smoker, stopped smoking many years  ago 01/17/2019   Hypertension    Polycythemia 01/17/2019   Thrombocytosis 01/17/2019   History reviewed. No pertinent surgical history.   A IV Location/Drains/Wounds Patient Lines/Drains/Airways Status     Active Line/Drains/Airways     Name Placement date Placement time Site Days   Peripheral IV 04/19/23 20 G Right Antecubital 04/19/23  1531  Antecubital  less than 1            Intake/Output Last 24 hours No intake or output data in the 24 hours ending 04/19/23 1544  Labs/Imaging Results for orders placed or performed during the hospital encounter of 04/19/23 (from the past 48 hour(s))  Basic metabolic panel     Status: Abnormal   Collection Time: 04/19/23 11:26 AM  Result Value Ref Range   Sodium 137 135 - 145 mmol/L   Potassium 4.8 3.5 - 5.1 mmol/L   Chloride 101 98 - 111 mmol/L   CO2 23 22 - 32 mmol/L   Glucose, Bld 104 (H) 70 - 99 mg/dL    Comment: Glucose reference range applies only to samples taken after fasting for at least 8 hours.   BUN 57 (H) 8 - 23 mg/dL   Creatinine, Ser 4.69 (H) 0.61 - 1.24 mg/dL   Calcium 9.0 8.9 - 62.9 mg/dL   GFR, Estimated 20 (L) >60 mL/min    Comment: (NOTE) Calculated using the CKD-EPI Creatinine Equation (2021)    Anion gap 13 5 - 15    Comment: Performed at [facility], [address], [place], [place] [address]   DG Chest 2 View  Result Date: 04/19/2023 CLINICAL DATA:  CHF. EXAM: CHEST - 2 VIEW COMPARISON:  10/21/2022  FINDINGS: Low volume film. The cardio pericardial silhouette is enlarged. Streaky opacity at the lung bases suggest atelectasis. No overt edema or substantial pleural effusion. Bones are diffusely demineralized. IMPRESSION: Low volume film with bibasilar atelectasis. Electronically Signed   By: Kennith Center M.D.   On: 04/19/2023 12:26    Pending Labs Unresulted Labs (From admission, onward)     Start     Ordered   04/26/23 0500  Creatinine, serum  (enoxaparin (LOVENOX)    CrCl < 30 ml/min)  Once,   R        Comments: while on enoxaparin therapy.    04/19/23 1506   04/20/23 0500  Magnesium  Tomorrow morning,   R        04/19/23 1506   04/20/23 0500  Comprehensive metabolic panel  Tomorrow morning,   R        04/19/23 1506   04/20/23 0500  CBC  Tomorrow morning,   R        04/19/23 1506   04/19/23 1310  CBC  Once,   STAT        04/19/23 1310   04/19/23 1310  Brain natriuretic peptide  Once,   STAT        04/19/23 1310            Vitals/Pain Today's Vitals   04/19/23 1109 04/19/23 1123 04/19/23 1455 04/19/23 1500  BP: 104/82  120/72 107/67  Pulse: 66  65 70  Resp: 17  16   Temp: 98.5 F (36.9 C)  98.5 F (36.9 C)   TempSrc: Oral  Oral   SpO2: 100%  100% 98%  Weight:  68.9 kg    Height:  5\' 7"  (1.702 m)    PainSc:  0-No pain      Isolation Precautions No active isolations  Medications Medications  enoxaparin (LOVENOX) injection 30 mg (30 mg Subcutaneous Given 04/19/23 1538)  furosemide (LASIX) injection 80 mg (has no administration in time range)  sodium chloride flush (NS) 0.9 % injection 3 mL (3 mLs Intravenous Given 04/19/23 1534)  acetaminophen (TYLENOL) tablet 650 mg (has no administration in time range)    Or  acetaminophen (TYLENOL) suppository 650 mg (has no administration in time range)  polyethylene glycol (MIRALAX / GLYCOLAX) packet 17 g (has no administration in time range)  albuterol (PROVENTIL) (2.5 MG/3ML) 0.083% nebulizer solution 2.5 mg (has no administration in time range)  donepezil (ARICEPT) tablet 10 mg (has no administration in time range)  hydroxyurea (HYDREA) capsule 500 mg (has no administration in time range)  metoprolol succinate (TOPROL-XL) 24 hr tablet 25 mg (has no administration in time range)  OLANZapine (ZYPREXA) tablet 5 mg (has no administration in time range)  furosemide (LASIX) injection 80 mg (80 mg Intravenous Given 04/19/23 1537)  haloperidol lactate (HALDOL) injection 2.5 mg (2.5 mg Intravenous Given 04/19/23 1535)     Mobility walks with device     R Recommendations: See Admitting Provider Note  Report given to: 1O10

## 2023-04-20 DIAGNOSIS — I5043 Acute on chronic combined systolic (congestive) and diastolic (congestive) heart failure: Secondary | ICD-10-CM | POA: Diagnosis not present

## 2023-04-20 DIAGNOSIS — N179 Acute kidney failure, unspecified: Secondary | ICD-10-CM | POA: Diagnosis not present

## 2023-04-20 DIAGNOSIS — I10 Essential (primary) hypertension: Secondary | ICD-10-CM | POA: Diagnosis not present

## 2023-04-20 DIAGNOSIS — D45 Polycythemia vera: Secondary | ICD-10-CM | POA: Diagnosis not present

## 2023-04-20 DIAGNOSIS — F03C0 Unspecified dementia, severe, without behavioral disturbance, psychotic disturbance, mood disturbance, and anxiety: Secondary | ICD-10-CM | POA: Diagnosis not present

## 2023-04-20 DIAGNOSIS — N1831 Chronic kidney disease, stage 3a: Secondary | ICD-10-CM | POA: Diagnosis not present

## 2023-04-20 LAB — COMPREHENSIVE METABOLIC PANEL
ALT: 21 U/L (ref 0–44)
AST: 37 U/L (ref 15–41)
Albumin: 3.4 g/dL — ABNORMAL LOW (ref 3.5–5.0)
Alkaline Phosphatase: 87 U/L (ref 38–126)
Anion gap: 15 (ref 5–15)
BUN: 59 mg/dL — ABNORMAL HIGH (ref 8–23)
CO2: 20 mmol/L — ABNORMAL LOW (ref 22–32)
Calcium: 9.1 mg/dL (ref 8.9–10.3)
Chloride: 102 mmol/L (ref 98–111)
Creatinine, Ser: 2.99 mg/dL — ABNORMAL HIGH (ref 0.61–1.24)
GFR, Estimated: 21 mL/min — ABNORMAL LOW (ref >60)
Glucose, Bld: 88 mg/dL (ref 70–99)
Potassium: 4.6 mmol/L (ref 3.5–5.1)
Sodium: 137 mmol/L (ref 135–145)
Total Bilirubin: 2.9 mg/dL — ABNORMAL HIGH (ref 0.3–1.2)
Total Protein: 7.2 g/dL (ref 6.5–8.1)

## 2023-04-20 LAB — CBC
HCT: 37.5 % — ABNORMAL LOW (ref 39.0–52.0)
Hemoglobin: 11.9 g/dL — ABNORMAL LOW (ref 13.0–17.0)
MCH: 40.9 pg — ABNORMAL HIGH (ref 26.0–34.0)
MCHC: 31.7 g/dL (ref 30.0–36.0)
MCV: 128.9 fL — ABNORMAL HIGH (ref 80.0–100.0)
Platelets: 144 10*3/uL — ABNORMAL LOW (ref 150–400)
RBC: 2.91 MIL/uL — ABNORMAL LOW (ref 4.22–5.81)
RDW: 23 % — ABNORMAL HIGH (ref 11.5–15.5)
WBC: 3.4 10*3/uL — ABNORMAL LOW (ref 4.0–10.5)
nRBC: 1.5 % — ABNORMAL HIGH (ref 0.0–0.2)

## 2023-04-20 LAB — MAGNESIUM: Magnesium: 2.7 mg/dL — ABNORMAL HIGH (ref 1.7–2.4)

## 2023-04-20 MED ORDER — MELATONIN 3 MG PO TABS
3.0000 mg | ORAL_TABLET | Freq: Every day | ORAL | Status: DC
  Administered 2023-04-20 – 2023-05-02 (×13): 3 mg via ORAL
  Filled 2023-04-20 (×13): qty 1

## 2023-04-20 NOTE — Care Management Obs Status (Signed)
MEDICARE OBSERVATION STATUS NOTIFICATION   Patient Details  Name: James Bryant MRN: [id number] Date of Birth: 1946/07/05   Medicare Observation Status Notification Given:  Yes    Leone Haven, RN 04/20/2023, 5:02 PM

## 2023-04-20 NOTE — Evaluation (Signed)
Physical Therapy Evaluation Patient Details Name: James Bryant MRN: [id number] DOB: 1946/01/19 Today's Date: 04/20/2023  History of Present Illness  Pt is a 77 y/o male presenting with abnormal labs, edema and lethargy.  Work up includes acute on chronic combined systolic and diastolic CHF.  PMHx:  CHF, Dementia, HTN  Clinical Impression  Pt admitted with/for the above stated problem incl combined CHF.  Pt needing CG to min assist overall..  Pt currently limited functionally due to the problems listed below.  (see problems list.)  Pt will benefit from PT to maximize function and safety to be able to get home safely with available assist.         If plan is discharge home, recommend the following:     Can travel by private vehicle        Equipment Recommendations BSC/3in1  Recommendations for Other Services       Functional Status Assessment Patient has had a recent decline in their functional status and demonstrates the ability to make significant improvements in function in a reasonable and predictable amount of time.     Precautions / Restrictions Precautions Precautions: Fall      Mobility  Bed Mobility               General bed mobility comments: up in the recliner on arrival    Transfers Overall transfer level: Needs assistance Equipment used: Rolling walker (2 wheels) Transfers: Sit to/from Stand Sit to Stand: Min assist           General transfer comment: stability assist, no boost.    Ambulation/Gait Ambulation/Gait assistance: Contact guard assist, Min assist Gait Distance (Feet): 40 Feet Assistive device: Rolling walker (2 wheels) Gait Pattern/deviations: Step-through pattern   Gait velocity interpretation: <1.8 ft/sec, indicate of risk for recurrent falls   General Gait Details: mildly unsteady, staying behind the RW, but corrects for posture and safety with cues.  Stairs            Wheelchair Mobility     Tilt Bed     Modified Rankin (Stroke Patients Only)       Balance Overall balance assessment: Needs assistance Sitting-balance support: No upper extremity supported, Feet supported Sitting balance-Leahy Scale: Fair       Standing balance-Leahy Scale: Poor Standing balance comment: light assist or use of the AD.                             Pertinent Vitals/Pain Pain Assessment Pain Assessment: Faces Faces Pain Scale: No hurt Pain Intervention(s): Monitored during session    Home Living Family/patient expects to be discharged to:: Private residence Living Arrangements: Spouse/significant other Available Help at Discharge: Family;Available 24 hours/day Type of Home: House Home Access: Stairs to enter Entrance Stairs-Rails: None Entrance Stairs-Number of Steps: 3   Home Layout: One level Home Equipment: Agricultural consultant (2 wheels);Rollator (4 wheels) Additional Comments: Active with HHPT at time of admission    Prior Function Prior Level of Function : Needs assist;History of Falls (last six months)             Mobility Comments: Family is hands on for mobility with RW ADLs Comments: Family assist with all ADL's     Extremity/Trunk Assessment   Upper Extremity Assessment Upper Extremity Assessment: Generalized weakness;Overall [facility] for tasks assessed    Lower Extremity Assessment Lower Extremity Assessment: Generalized weakness;Overall [facility] for tasks assessed  Communication   Communication Communication: No apparent difficulties  Cognition Arousal: Alert Behavior During Therapy: Flat affect Overall Cognitive Status: History of cognitive impairments - at baseline                                          General Comments General comments (skin integrity, edema, etc.): vss    Exercises     Assessment/Plan    PT Assessment Patient needs continued PT services  PT Problem List Decreased strength;Decreased activity tolerance;Decreased  balance;Decreased mobility;Decreased safety awareness       PT Treatment Interventions Gait training;Stair training;Functional mobility training;Therapeutic activities;Balance training;Patient/family education    PT Goals (Current goals can be found in the Care Plan section)  Acute Rehab PT Goals Patient Stated Goal: home safe with needed equipment PT Goal Formulation: Patient unable to participate in goal setting Time For Goal Achievement: 05/04/23 Potential to Achieve Goals: Good    Frequency Min 1X/week     Co-evaluation               AM-PAC PT "6 Clicks" Mobility  Outcome Measure Help needed turning from your back to your side while in a flat bed without using bedrails?: A Little Help needed moving from lying on your back to sitting on the side of a flat bed without using bedrails?: A Little Help needed moving to and from a bed to a chair (including a wheelchair)?: A Little Help needed standing up from a chair using your arms (e.g., wheelchair or bedside chair)?: A Little Help needed to walk in hospital room?: A Little Help needed climbing 3-5 steps with a railing? : A Little 6 Click Score: 18    End of Session   Activity Tolerance: Patient tolerated treatment well Patient left: in chair;with call bell/phone within reach;with family/visitor present Nurse Communication: Mobility status PT Visit Diagnosis: Unsteadiness on feet (R26.81);Muscle weakness (generalized) (M62.81);Difficulty in walking, not elsewhere classified (R26.2)    Time: 8295-6213 PT Time Calculation (min) (ACUTE ONLY): 17 min   Charges:   PT Evaluation $PT Eval Moderate Complexity: 1 Mod   PT General Charges $$ ACUTE PT VISIT: 1 Visit         04/20/2023  Jacinto Halim., PT Acute Rehabilitation Services [phone]  (office)  Eliseo Gum Alyaan Budzynski 04/20/2023, 5:30 PM

## 2023-04-20 NOTE — Progress Notes (Signed)
  Progress Note   Patient: James Bryant:[id number] DOB: Feb 26, 1946 DOA: 04/19/2023     0 DOS: the patient was seen and examined on 04/20/2023        Brief hospital course: James Bryant is a 77 y.o. M with dementia, lives at home CKD IIIa HTN and sCHF who presented leg swelling, lethargy.  Had tried to diurese at home with oral Lasix but without improvement.     Assessment and Plan: Acute on chronic systolic and diastolic congestive heart failure -Continue IV Lasix - Potassium supplement - Strict ins and outs, daily BMP   Acute kidney injury on CKD Baseline 1.6.  Creatinine on admission here was 3.07, has improved to 2.99 with diuresis overnight - Continue daily BMP  Dementia -Continue donepezil, olanzapine  Hypertension Blood pressure okay - Continue metoprolol  Polycythemia -Continue hydroxyurea        Subjective: Patient is nonverbal, daughter and believes he was agitated and restless overnight, but improving today, still extremely swollen, still weak relative to his baseline     Physical Exam: BP (!) 167/149 (BP Location: Left Arm)   Pulse 75   Temp (!) 97.4 F (36.3 C) (Oral)   Resp 17   Ht 5\' 7"  (1.702 m)   Wt 75 kg   SpO2 98%   BMI 25.90 kg/m   General: Pt is awake, somnolent sluggish, not in acute distress Cardiovascular: RRR, nl S1-S2, no murmurs appreciated.   3+ LE edema.  JVP up to mid neck sitting straight up Respiratory: Diminished, shallow Abdominal: Abdomen soft and non-tender.  No distension or HSM.   Neuro/Psych: Strength symmetric in upper and lower extremities.  Judgment and insight appear impaired.   Data Reviewed: CBC and basic metabolic panel reviewed  Family Communication: Daughter at the bedside    Disposition: The patient was admitted for congestive heart failure, he has severe swelling, inability to walk at home, and decreased functional status as result of his congestive heart failure.  Outpatient management was  tried and failed.  In addition he has acute kidney injury, and outpatient management wtihout IV Lasix and daily labs would be unnecessarily unsafe        Author: Alberteen Sam, MD 04/20/2023 6:27 PM  For on call review [email or web address].

## 2023-04-20 NOTE — TOC Initial Note (Signed)
Transition of Care [facility]) - Initial/Assessment Note    Patient Details  Name: James Bryant MRN: [id number] Date of Birth: [birth year]  Transition of Care [facility]) CM/SW Contact:    Leone Haven, RN Phone Number: 04/20/2023, 3:53 PM  Clinical Narrative:                 From home with spouse and daughter,  has PCP and insurance on file, states has no HH services in place at this time, he does go to OP physical therapy with [facility] on [address]  [phone] and daughter states they would like to continue with that.  He has a walker and a rollator.  They wold like a light weight w/chair, has no preference of agency, NCM made referral to [facility] with [facility], he will bring w/chair up to room prior to dc.    Daughter states she or wife  will transport him home at [employer] and family is support system, states gets medications from [facility] on [address] and Media planner.  Pta ambulates with walker.  Daughter states he also has [facility] for outpatient palliative services.  Daughter is James Bryant she will send her FMLA papers to patient's PCP.  Await pt eval also  Expected Discharge Plan: Home w Home Health Services Barriers to Discharge: Continued Medical Work up   Patient Goals and CMS Choice Patient states their goals for this hospitalization and ongoing recovery are:: return home   Choice offered to / list presented to : NA      Expected Discharge Plan and Services   Discharge Planning Services: CM Consult   Living arrangements for the past 2 months: Single Family Home                 DME Arranged: Community education officer wheelchair with seat cushion DME Agency: [employer] Date DME Agency Contacted: 04/20/23 Time DME Agency Contacted: 1550 Representative spoke with at DME Agency: Ian Malkin HH Arranged: NA          Prior Living Arrangements/Services Living arrangements for the past 2 months: Single Family Home Lives with:: Adult Children, Spouse Patient language and need  for interpreter reviewed:: Yes Do you feel safe going back to the place where you live?: Yes      Need for Family Participation in Patient Care: Yes (Comment) Care giver support system in place?: Yes (comment) Current home services: DME (walker, rollator, OP physical therapy with [facility]) Criminal Activity/Legal Involvement Pertinent to Current Situation/Hospitalization: No - Comment as needed  Activities of Daily Living Home Assistive Devices/Equipment: Eyeglasses ADL Screening (condition at time of admission) Patient's cognitive ability adequate to safely complete daily activities?: No Is the patient deaf or have difficulty hearing?: No Does the patient have difficulty seeing, even when wearing glasses/contacts?: No Does the patient have difficulty concentrating, remembering, or making decisions?: Yes Patient able to express need for assistance with ADLs?: No Does the patient have difficulty dressing or bathing?: Yes Independently performs ADLs?: No Communication: Independent Dressing (OT): Dependent Is this a change from baseline?: Pre-admission baseline Grooming: Dependent Is this a change from baseline?: Pre-admission baseline Feeding: Independent Bathing: Dependent Is this a change from baseline?: Pre-admission baseline Toileting: Dependent Is this a change from baseline?: Pre-admission baseline In/Out Bed: Dependent Is this a change from baseline?: Pre-admission baseline Walks in Home: Dependent Is this a change from baseline?: Pre-admission baseline Does the patient have difficulty walking or climbing stairs?: Yes Weakness of Legs: Both Weakness of Arms/Hands: None  Permission Sought/Granted  Permission sought to share information with : Case Manager Permission granted to share information with : Yes, Verbal Permission Granted              Emotional Assessment Appearance:: Appears stated age Attitude/Demeanor/Rapport:  (sleepy) Affect (typically  observed):  (sleepy) Orientation: : Oriented to Self Alcohol / Substance Use: Not Applicable Psych Involvement: No (comment)  Admission diagnosis:  AKI (acute kidney injury) (HCC) [N17.9] Acute on chronic combined systolic (congestive) and diastolic (congestive) heart failure (HCC) [I50.43] Hypervolemia, unspecified hypervolemia type [E87.70] Patient Active Problem List   Diagnosis Date Noted   Acute renal failure superimposed on stage 3a chronic kidney disease (HCC) 04/19/2023   Acute on chronic combined systolic (congestive) and diastolic (congestive) heart failure (HCC) 04/19/2023   Insomnia due to medical condition 04/09/2023   Sleep disturbance 04/09/2023   Moderate persistent asthma 04/09/2023   Palliative care patient 12/11/2022   Arthritis of knee 12/11/2022   Systolic congestive heart failure with reduced left ventricular function, NYHA class 2 (HCC) 10/18/2022   At risk for falling 10/18/2022   Severe dementia (HCC) 10/18/2022   Balance problem 05/31/2022   Urinary incontinence 04/06/2022   Dependence on cane 12/26/2021   Primary hypertension 12/20/2021   Congestive heart failure, NYHA class 2 (HCC) 04/08/2021   Iron deficiency 04/08/2021   Decreased activities of daily living (ADL) 03/23/2021   Mobility impaired 03/23/2021   Stage 3a chronic kidney disease (HCC) 02/28/2021   Vitamin D deficiency 01/07/2020   Polycythemia vera (HCC) 02/26/2019   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   Former smoker, stopped smoking many years ago 01/17/2019   Personal history of noncompliance with medical treatment, presenting hazards to health 01/09/2019   PCP:  Jac Canavan, PA-C Pharmacy:   [facility] - [place], [place] - [address] [place] [address] Phone: [phone] Fax: [phone]  [id number] [place], [place] - [address] AT [facility] [address] [facility] [address] [place] [address] Phone:  [phone] Fax: [phone]     Social Determinants of Health (SDOH) Social History: SDOH Screenings   Food Insecurity: No Food Insecurity (04/19/2023)  Housing: Low Risk  (04/19/2023)  Transportation Needs: No Transportation Needs (04/19/2023)  Utilities: Not At Risk (04/19/2023)  Alcohol Screen: Low Risk  (04/09/2023)  Depression (PHQ2-9): Low Risk  (04/09/2023)  Financial Resource Strain: Low Risk  (04/09/2023)  Physical Activity: Unknown (04/09/2023)  Social Connections: Unknown (04/09/2023)  Stress: No Stress Concern Present (04/09/2023)  Tobacco Use: Medium Risk (04/19/2023)   SDOH Interventions:     Readmission Risk Interventions     No data to display

## 2023-04-20 NOTE — Progress Notes (Signed)
    Durable Medical Equipment  (From admission, onward)           Start     Ordered   04/20/23 1548  For home use only DME lightweight manual wheelchair with seat cushion  Once       Comments: Patient suffers from weakness which impairs their ability to perform daily activities like bathing, dressing, feeding, and grooming in the home.  A cane or walker will not resolve  issue with performing activities of daily living. A wheelchair will allow patient to safely perform daily activities. Patient is not able to propel themselves in the home using a standard weight wheelchair due to general weakness. Patient can self propel in the lightweight wheelchair. Length of need Lifetime. Accessories: elevating leg rests (ELRs), wheel locks, extensions and anti-tippers.   04/20/23 1548

## 2023-04-20 NOTE — Plan of Care (Signed)
  Problem: Pain Managment: Goal: General experience of comfort will improve Outcome: Completed/Met

## 2023-04-21 DIAGNOSIS — I471 Supraventricular tachycardia, unspecified: Secondary | ICD-10-CM | POA: Diagnosis not present

## 2023-04-21 DIAGNOSIS — E871 Hypo-osmolality and hyponatremia: Secondary | ICD-10-CM | POA: Diagnosis not present

## 2023-04-21 DIAGNOSIS — D45 Polycythemia vera: Secondary | ICD-10-CM | POA: Diagnosis present

## 2023-04-21 DIAGNOSIS — I77810 Thoracic aortic ectasia: Secondary | ICD-10-CM | POA: Diagnosis not present

## 2023-04-21 DIAGNOSIS — E872 Acidosis, unspecified: Secondary | ICD-10-CM | POA: Diagnosis not present

## 2023-04-21 DIAGNOSIS — J90 Pleural effusion, not elsewhere classified: Secondary | ICD-10-CM | POA: Diagnosis not present

## 2023-04-21 DIAGNOSIS — M25462 Effusion, left knee: Secondary | ICD-10-CM | POA: Diagnosis not present

## 2023-04-21 DIAGNOSIS — Z515 Encounter for palliative care: Secondary | ICD-10-CM | POA: Diagnosis not present

## 2023-04-21 DIAGNOSIS — R627 Adult failure to thrive: Secondary | ICD-10-CM | POA: Diagnosis present

## 2023-04-21 DIAGNOSIS — I10 Essential (primary) hypertension: Secondary | ICD-10-CM | POA: Diagnosis not present

## 2023-04-21 DIAGNOSIS — R570 Cardiogenic shock: Secondary | ICD-10-CM | POA: Diagnosis not present

## 2023-04-21 DIAGNOSIS — I130 Hypertensive heart and chronic kidney disease with heart failure and stage 1 through stage 4 chronic kidney disease, or unspecified chronic kidney disease: Secondary | ICD-10-CM | POA: Diagnosis present

## 2023-04-21 DIAGNOSIS — R0902 Hypoxemia: Secondary | ICD-10-CM | POA: Diagnosis present

## 2023-04-21 DIAGNOSIS — N179 Acute kidney failure, unspecified: Secondary | ICD-10-CM | POA: Diagnosis present

## 2023-04-21 DIAGNOSIS — K767 Hepatorenal syndrome: Secondary | ICD-10-CM | POA: Diagnosis present

## 2023-04-21 DIAGNOSIS — J45909 Unspecified asthma, uncomplicated: Secondary | ICD-10-CM | POA: Diagnosis present

## 2023-04-21 DIAGNOSIS — R918 Other nonspecific abnormal finding of lung field: Secondary | ICD-10-CM | POA: Diagnosis not present

## 2023-04-21 DIAGNOSIS — I517 Cardiomegaly: Secondary | ICD-10-CM | POA: Diagnosis not present

## 2023-04-21 DIAGNOSIS — J9811 Atelectasis: Secondary | ICD-10-CM | POA: Diagnosis present

## 2023-04-21 DIAGNOSIS — Z87891 Personal history of nicotine dependence: Secondary | ICD-10-CM | POA: Diagnosis not present

## 2023-04-21 DIAGNOSIS — F03C18 Unspecified dementia, severe, with other behavioral disturbance: Secondary | ICD-10-CM | POA: Diagnosis present

## 2023-04-21 DIAGNOSIS — Z7189 Other specified counseling: Secondary | ICD-10-CM | POA: Diagnosis not present

## 2023-04-21 DIAGNOSIS — E875 Hyperkalemia: Secondary | ICD-10-CM | POA: Diagnosis not present

## 2023-04-21 DIAGNOSIS — F03C0 Unspecified dementia, severe, without behavioral disturbance, psychotic disturbance, mood disturbance, and anxiety: Secondary | ICD-10-CM | POA: Diagnosis not present

## 2023-04-21 DIAGNOSIS — I428 Other cardiomyopathies: Secondary | ICD-10-CM | POA: Diagnosis present

## 2023-04-21 DIAGNOSIS — I5084 End stage heart failure: Secondary | ICD-10-CM | POA: Diagnosis present

## 2023-04-21 DIAGNOSIS — Z79899 Other long term (current) drug therapy: Secondary | ICD-10-CM | POA: Diagnosis not present

## 2023-04-21 DIAGNOSIS — I5043 Acute on chronic combined systolic (congestive) and diastolic (congestive) heart failure: Secondary | ICD-10-CM | POA: Diagnosis present

## 2023-04-21 DIAGNOSIS — I5082 Biventricular heart failure: Secondary | ICD-10-CM | POA: Diagnosis present

## 2023-04-21 DIAGNOSIS — N1831 Chronic kidney disease, stage 3a: Secondary | ICD-10-CM | POA: Diagnosis present

## 2023-04-21 LAB — COMPREHENSIVE METABOLIC PANEL
ALT: 23 U/L (ref 0–44)
AST: 41 U/L (ref 15–41)
Albumin: 3.5 g/dL (ref 3.5–5.0)
Alkaline Phosphatase: 108 U/L (ref 38–126)
Anion gap: 14 (ref 5–15)
BUN: 58 mg/dL — ABNORMAL HIGH (ref 8–23)
CO2: 24 mmol/L (ref 22–32)
Calcium: 9.2 mg/dL (ref 8.9–10.3)
Chloride: 97 mmol/L — ABNORMAL LOW (ref 98–111)
Creatinine, Ser: 2.79 mg/dL — ABNORMAL HIGH (ref 0.61–1.24)
GFR, Estimated: 22 mL/min — ABNORMAL LOW (ref >60)
Glucose, Bld: 93 mg/dL (ref 70–99)
Potassium: 4.4 mmol/L (ref 3.5–5.1)
Sodium: 135 mmol/L (ref 135–145)
Total Bilirubin: 2.6 mg/dL — ABNORMAL HIGH (ref 0.3–1.2)
Total Protein: 7.6 g/dL (ref 6.5–8.1)

## 2023-04-21 LAB — CBC
HCT: 38.1 % — ABNORMAL LOW (ref 39.0–52.0)
Hemoglobin: 12.1 g/dL — ABNORMAL LOW (ref 13.0–17.0)
MCH: 41.2 pg — ABNORMAL HIGH (ref 26.0–34.0)
MCHC: 31.8 g/dL (ref 30.0–36.0)
MCV: 129.6 fL — ABNORMAL HIGH (ref 80.0–100.0)
Platelets: 167 10*3/uL (ref 150–400)
RBC: 2.94 MIL/uL — ABNORMAL LOW (ref 4.22–5.81)
RDW: 23.3 % — ABNORMAL HIGH (ref 11.5–15.5)
WBC: 3.7 10*3/uL — ABNORMAL LOW (ref 4.0–10.5)
nRBC: 1.9 % — ABNORMAL HIGH (ref 0.0–0.2)

## 2023-04-21 MED ORDER — POTASSIUM CHLORIDE 20 MEQ PO PACK
20.0000 meq | PACK | Freq: Every day | ORAL | Status: DC
  Administered 2023-04-21 – 2023-04-23 (×3): 20 meq via ORAL
  Filled 2023-04-21 (×3): qty 1

## 2023-04-21 MED ORDER — FUROSEMIDE 10 MG/ML IJ SOLN
120.0000 mg | Freq: Two times a day (BID) | INTRAVENOUS | Status: DC
  Filled 2023-04-21: qty 12

## 2023-04-21 MED ORDER — FUROSEMIDE 10 MG/ML IJ SOLN
120.0000 mg | Freq: Two times a day (BID) | INTRAVENOUS | Status: DC
  Administered 2023-04-21 – 2023-04-22 (×3): 120 mg via INTRAVENOUS
  Filled 2023-04-21: qty 12
  Filled 2023-04-21: qty 2
  Filled 2023-04-21: qty 10
  Filled 2023-04-21: qty 12
  Filled 2023-04-21: qty 10

## 2023-04-21 NOTE — Assessment & Plan Note (Signed)
Continue hydroxyurea

## 2023-04-21 NOTE — Progress Notes (Signed)
Mobility Specialist Progress Note    04/21/23 0915  Mobility  Activity Ambulated with assistance in room  Level of Assistance Minimal assist, patient does 75% or more  Assistive Device Front wheel walker  Distance Ambulated (ft) 30 ft (10+20)  Activity Response Tolerated well  Mobility Referral Yes  $Mobility charge 1 Mobility  Mobility Specialist Start Time (ACUTE ONLY) 0900  Mobility Specialist Stop Time (ACUTE ONLY) 0914  Mobility Specialist Time Calculation (min) (ACUTE ONLY) 14 min   Pre-Mobility: 83 HR  Pt received in bed and agreeable. No complaints. Took x1 seated rest break. Returned to chair with call bell in reach and chair alarm on.   [place] Mobility Specialist  Please Neurosurgeon or Rehab Office at [phone]

## 2023-04-21 NOTE — Progress Notes (Signed)
  Progress Note   Patient: James Bryant DOB: 10/09/1945 DOA: 04/19/2023     0 DOS: the patient was seen and examined on 04/21/2023 at 10:07 AM      Brief hospital course: James Bryant is a 77 y.o. M with dementia, HTN, sdCHF EF 20-25%, CKD IIIa baseline 1.6 and polycythemia who presented with CHF flare failed outpatient management.       Assessment and Plan: * Acute on chronic combined systolic and diastolic CHF (congestive heart failure) (HCC) Net negative 1.1L yesterday.  Cr improving.  Still markedly edematous and limited in movement. - Continue IV Lasix, increase to 120 mg IV BID - Potassium suppl - Strict I/Os, daily weights, telemetry  - Daily monitoring renal function     Acute renal failure superimposed on stage 3a chronic kidney disease (HCC) Cr >3 on admission.  Due to congestive nephropathy, resolving with diuretics - Hold ARB - Daily BMP while on diuretics  Severe dementia (HCC) - Continue donepezil/Zyprexa - PT/OT  Primary hypertension BP soft - Continue aspirin - Hold valsartan - Continue metoprolol  Polycythemia vera (HCC) - Continue hydroxyurea          Subjective: Patient is nonverbal.  Nursing have no concerns.  Still somewhat limited movement.     Physical Exam: BP 96/74 (BP Location: Right Arm)   Pulse 97   Temp 97.6 F (36.4 C) (Oral)   Resp 19   Ht 5\' 7"  (1.702 m)   Wt 73 kg   SpO2 96%   BMI 25.21 kg/m   Elderly adult male, sitting up in recliner, sleeping, poorly responsive, sluggishly opens eyes, then puts back closed. RRR, no murmurs appreciated, still 3+ lower extremity edema bilaterally, still JVP elevated to mid neck while sitting upright Respiratory rate seems normal, lung sounds overall diminished Abdomen soft without tenderness to palpation Attention diminished, affect blunted, judgment and insight cannot be assessed, generalized weakness.     Data Reviewed: Basic metabolic panel shows  creatinine down to 2.7, sodium and potassium normal  Family Communication:     Disposition: Status is: Inpatient The patient was admitted with congestive heart failure.  He was treated with increased diuretics as an outpatient and failed, symptoms, worsening return to the hospital with kidney failure  In the setting of kidney failure, advanced age, advanced dementia, and need for escalating doses of IV Lasix, he should be managed as an inpatient        Author: Alberteen Sam, MD 04/21/2023 3:32 PM  For on call review [email or web address].

## 2023-04-21 NOTE — Evaluation (Signed)
Occupational Therapy Evaluation Patient Details Name: James Bryant MRN: [id number] DOB: [birth year] Today's Date: 04/21/2023   History of Present Illness Pt is a 77 y/o male presenting with abnormal labs, edema and lethargy.  Work up includes acute on chronic combined systolic and diastolic CHF.  PMHx:  CHF, Dementia, HTN   Clinical Impression   At baseline pt requires Set up to Contact guard assist with initiation cues for grooming and self-feeding and Mod to Max assist with dressing, bathing, and toileting all with cues. At baseline, pt completes functional transfers and mobility with a RW with close Supervision to Contact guard assist. Pt now present with decreased activity tolerance, decreased L UE fine motor coordination, decreased balance during functional tasks, and mildly decreased safety and independence with ADLs. Pt currently requiring Max assist for UB/LB dressing, bathing, and toileting; Set up for self feeding; and Contact guard to Min assist for grooming. Pt currently requiring Min assist for funcitonal transfers with RW. Pt will benefit from acute skilled OT services to address deficits outlined below, decrease caregiver burden, and increase safety and independence with ADLs and functional transfers. Post acute discharge, pt will benefit from outpatient skilled OT services to maximize rehab potential and for additional family education.       If plan is discharge home, recommend the following: A little help with walking and/or transfers;A lot of help with bathing/dressing/bathroom;Assistance with cooking/housework;Assistance with feeding;Direct supervision/assist for medications management;Direct supervision/assist for financial management;Assist for transportation;Help with stairs or ramp for entrance;Supervision due to cognitive status (Set up and initiation cues for self feeding)    Functional Status Assessment  Patient has had a recent decline in their functional status and  demonstrates the ability to make significant improvements in function in a reasonable and predictable amount of time.  Equipment Recommendations  None recommended by OT    Recommendations for Other Services       Precautions / Restrictions Precautions Precautions: Fall Restrictions Weight Bearing Restrictions: No      Mobility Bed Mobility               General bed mobility comments: Pt sitting in recliner at beginning and end of session.    Transfers Overall transfer level: Needs assistance Equipment used: Rolling walker (2 wheels) Transfers: Sit to/from Stand Sit to Stand: Min assist                  Balance Overall balance assessment: Needs assistance Sitting-balance support: No upper extremity supported, Feet supported Sitting balance-Leahy Scale: Fair     Standing balance support: Bilateral upper extremity supported, Reliant on assistive device for balance, During functional activity Standing balance-Leahy Scale: Poor                             ADL either performed or assessed with clinical judgement   ADL Overall ADL's : Needs assistance/impaired                                       General ADL Comments: Pt at or near baseline with ADLs but with decreased activity tolerance and decreased L fine motor coordination per wife's report. Pt currently performing UB/LB dressing, bathing, and toileting with Max assist and Max cues; self feeding with Set up and initiation cues; grooming with Contact guard assist to Min assist with initiation cues. Pt now requiring  Min assist for funcitonal transfers with RW.     Vision         Perception         Praxis         Pertinent Vitals/Pain Pain Assessment Pain Assessment: No/denies pain Pain Intervention(s): Monitored during session     Extremity/Trunk Assessment Upper Extremity Assessment Upper Extremity Assessment: Generalized weakness;Right hand dominant;LUE  deficits/detail;RUE deficits/detail RUE Deficits / Details: generalized weakness, decreased fine motor (worse on Left) RUE Coordination: decreased fine motor (worse on Left) LUE Deficits / Details: generalized weakness, decreased fine motor (worse on Left) LUE Coordination: decreased fine motor (worse on Left)   Lower Extremity Assessment Lower Extremity Assessment: Defer to PT evaluation       Communication Communication Communication: Other (comment) (Requires increased time for processing)   Cognition Arousal: Alert Behavior During Therapy: Flat affect Overall Cognitive Status: History of cognitive impairments - at baseline                                 General Comments: Oriented to self and wife and pleasant throughout session. Demonstrates ability to follow 1 step commands in 7/10 trials with increased time and visual and tactile cues.     General Comments  VSS on RA throughout session. Pt's wife present throughout session.    Exercises     Shoulder Instructions      Home Living Family/patient expects to be discharged to:: Private residence Living Arrangements: Spouse/significant other Available Help at Discharge: Family;Available 24 hours/day Type of Home: House Home Access: Stairs to enter [employer] of Steps: 3 Entrance Stairs-Rails: None Home Layout: One level     Bathroom Shower/Tub: Chief Strategy Officer: Standard     Home Equipment: Agricultural consultant (2 wheels);Rollator (4 wheels)   Additional Comments: Active with outpatient rehab services at time of admission.      Prior Functioning/Environment Prior Level of Function : Needs assist;History of Falls (last six months)             Mobility Comments: Pt typically performs funcitonal mobility with a RW with close Supervision to Contact guard assist. ADLs Comments: At baseline, pt requires Mod assist and Max cues for UB dressing and bathing; Max assist and Max  cues for LB dressing, bathing, and toileting; and Set up to Contact guard assist with initiation cues for grooming and self-feeding.        OT Problem List: Decreased strength;Decreased activity tolerance;Impaired balance (sitting and/or standing);Decreased coordination      OT Treatment/Interventions: Self-care/ADL training;Therapeutic exercise;Therapeutic activities;Patient/family education;Balance training    OT Goals(Current goals can be found in the care plan section) Acute Rehab OT Goals Patient Stated Goal: pt unable to state OT Goal Formulation: With family Time For Goal Achievement: 05/05/23 Potential to Achieve Goals: Fair ADL Goals Pt Will Perform Grooming: (P) with set-up;sitting (with initiation cue) Pt Will Perform Upper Body Bathing: (P) with mod assist;sitting;with caregiver independent in assisting (with Max cues for sequencing) Pt Will Perform Upper Body Dressing: (P) with mod assist;with caregiver independent in assisting;sitting (with Max cues for sequencing) Pt Will Transfer to Toilet: ambulating;regular height toilet;with contact guard assist (with Max cues for sequencing) Pt Will Perform Toileting - Clothing Manipulation and hygiene: with mod assist;with caregiver independent in assisting;sitting/lateral leans;sit to/from stand (with Max cues for sequencing)  OT Frequency: Min 1X/week    Co-evaluation  AM-PAC OT "6 Clicks" Daily Activity     Outcome Measure Help from another person eating meals?: A Little Help from another person taking care of personal grooming?: A Little Help from another person toileting, which includes using toliet, bedpan, or urinal?: A Lot Help from another person bathing (including washing, rinsing, drying)?: A Lot Help from another person to put on and taking off regular upper body clothing?: A Lot Help from another person to put on and taking off regular lower body clothing?: A Lot 6 Click Score: 14   End of Session  Equipment Utilized During Treatment: Rolling walker (2 wheels) Nurse Communication: Mobility status  Activity Tolerance: Patient tolerated treatment well Patient left: in chair;with call bell/phone within reach;with family/visitor present  OT Visit Diagnosis: Other abnormalities of gait and mobility (R26.89);Muscle weakness (generalized) (M62.81);Ataxia, unspecified (R27.0);Other (comment) (Decreased activity tolerance)                Time: 1610-9604 OT Time Calculation (min): 20 min Charges:  OT General Charges $OT Visit: 1 Visit OT Evaluation $OT Eval Low Complexity: 1 Low  James Bryant "Orson Eva., OTR/L, MA Acute Rehab [phone]   Lendon Colonel 04/21/2023, 6:41 PM

## 2023-04-21 NOTE — Assessment & Plan Note (Signed)
Blood pressure slightly elevated - Continue aspirin - Hold valsartan - Continue metoprolol

## 2023-04-21 NOTE — Assessment & Plan Note (Signed)
-   Continue donepezil/Zyprexa - PT/OT

## 2023-04-21 NOTE — Assessment & Plan Note (Addendum)
Cr >3 on admission.  Due to congestive nephropathy, resolving with diuretics - Hold ARB - Daily BMP while on diuretics

## 2023-04-21 NOTE — Hospital Course (Addendum)
Mr. Mcquaig is a 77 y.o. male with past medical history of dementia, HTN, CHF EF 20-25%, CKD IIIa baseline 1.6 and polycythemia initially presented to hospital with abnormal labs edema lethargy weight gain for 1 to 2 weeks prior to presentation.  Patient was then admitted hospital for CHF flare failed outpatient management.  Assessment and Plan:  * Acute on chronic combined systolic and diastolic CHF  Continue strict intake and output charting.  Negative balance for 2192 mL so far.  Extremely edematous and difficult to ambulate more than 5 feet.  Continue IV Lasix 80 twice a day.  Continue supplementation.  Strict intake and output charting Daily weights.  Check 2D echocardiogram.  Previous 2D echocardiogram from 02/13/2023 shows EF of 20 to 25%.  Acute hypoxia.  Saturation were upto 70s and required supplemental oxygen up to 6 L yesterday.  Chest x-ray with cardiomegaly and small left pleural effusion.  Low lung volumes with bibasilar atelectasis.  Procalcitonin 0.1.  Blood cultures negative in less than 12 hours.  Lactate was elevated at 3.6 yesterday.  If fever will consider antibiotic.   Acute renal failure superimposed on stage 3a chronic kidney disease  Creatinine more than 3 on admission.  Creatinine today at 2.8.  Diuretics have been decreased to 80 mg twice a day.  Check daily BMP.  Due to congestive nephropathy, initially resolving with diuretics ARB on hold.  Continue BMP monitoring.  Severe dementia with behavioral disturbances and agitation - Continue donepezil/Zyprexa. PT/OT urinalysis without any infection.   Primary hypertension Continue aspirin and metoprolol.  Hold ARB.  Blood pressure seems to be stable.   Polycythemia vera (HCC) - Continue hydroxyurea   Hyperkalemia ARB on hold.  On  IV Lasix.  Latest potassium of 5.1.  Initial potassium of 5.6.   Hyponatremia Improved latest sodium of 136.   Left knee effusion Without pain. Low suspicion for infection  Goals of  care.  Patient with low EF with dementia.  Might benefit from palliative care consultation.

## 2023-04-21 NOTE — Assessment & Plan Note (Signed)
Net negative 1.1L yesterday.  Cr improving.  Still markedly edematous and limited in movement. - Continue IV Lasix, increase to 120 mg IV BID - Potassium suppl - Strict I/Os, daily weights, telemetry  - Daily monitoring renal function

## 2023-04-22 DIAGNOSIS — I10 Essential (primary) hypertension: Secondary | ICD-10-CM | POA: Diagnosis not present

## 2023-04-22 DIAGNOSIS — N179 Acute kidney failure, unspecified: Secondary | ICD-10-CM | POA: Diagnosis not present

## 2023-04-22 DIAGNOSIS — D45 Polycythemia vera: Secondary | ICD-10-CM | POA: Diagnosis not present

## 2023-04-22 DIAGNOSIS — I5043 Acute on chronic combined systolic (congestive) and diastolic (congestive) heart failure: Secondary | ICD-10-CM | POA: Diagnosis not present

## 2023-04-22 LAB — BASIC METABOLIC PANEL
Anion gap: 16 — ABNORMAL HIGH (ref 5–15)
BUN: 60 mg/dL — ABNORMAL HIGH (ref 8–23)
CO2: 20 mmol/L — ABNORMAL LOW (ref 22–32)
Calcium: 9 mg/dL (ref 8.9–10.3)
Chloride: 99 mmol/L (ref 98–111)
Creatinine, Ser: 2.59 mg/dL — ABNORMAL HIGH (ref 0.61–1.24)
GFR, Estimated: 24 mL/min — ABNORMAL LOW (ref >60)
Glucose, Bld: 88 mg/dL (ref 70–99)
Potassium: 5 mmol/L (ref 3.5–5.1)
Sodium: 135 mmol/L (ref 135–145)

## 2023-04-22 MED ORDER — FUROSEMIDE 10 MG/ML IJ SOLN: 160.0000 mg | Freq: Two times a day (BID) | INTRAVENOUS | Status: DC

## 2023-04-22 MED ORDER — FUROSEMIDE 10 MG/ML IJ SOLN
120.0000 mg | Freq: Two times a day (BID) | INTRAVENOUS | Status: DC
  Administered 2023-04-22: 120 mg via INTRAVENOUS
  Filled 2023-04-22: qty 10
  Filled 2023-04-22 (×2): qty 12

## 2023-04-22 NOTE — Progress Notes (Signed)
Mobility Specialist Progress Note    04/22/23 1214  Mobility  Activity Ambulated with assistance in room  Level of Assistance Minimal assist, patient does 75% or more  Assistive Device Front wheel walker  Distance Ambulated (ft) 45 ft  Activity Response Tolerated well  Mobility Referral Yes  $Mobility charge 1 Mobility  Mobility Specialist Start Time (ACUTE ONLY) 1203  Mobility Specialist Stop Time (ACUTE ONLY) 1213  Mobility Specialist Time Calculation (min) (ACUTE ONLY) 10 min   Pt received in chair and agreeable. No complaints on walk. Returned to chair with call bell in reach and wife present.   [place] Mobility Specialist  Please Neurosurgeon or Rehab Office at [phone]

## 2023-04-22 NOTE — Progress Notes (Signed)
  Progress Note   Patient: James Bryant ION:[id number] DOB: 11/23/1945 DOA: 04/19/2023     1 DOS: the patient was seen and examined on 04/22/2023 at 10:09AM      Brief hospital course: Mr. Umansky is a 77 y.o. M with dementia, HTN, sdCHF EF 20-25%, CKD IIIa baseline 1.6 and polycythemia who presented with CHF flare failed outpatient management.       Assessment and Plan: * Acute on chronic combined systolic and diastolic CHF (congestive heart failure) (HCC) I/Os not recorded yesterday.  Weight down 5lbs with higher dose Lasix.  Still marked edema.  Cr still improving but still >2.    - Continue IV Lasix 120 BID - Potassium suppl - Strict I/Os, daily weights, telemetry  - Daily monitoring renal function     Acute renal failure superimposed on stage 3a chronic kidney disease (HCC) Cr >3 on admission.  Due to congestive nephropathy, resolving with diuretics - Hold ARB - Daily BMP while on diuretics  Severe dementia (HCC) - Continue donepezil/Zyprexa - PT/OT  Primary hypertension BP elevated - Continue aspirin - Hold valsartan - Continue Lasix and metoprolol  Polycythemia vera (HCC) - Continue hydroxyurea          Subjective: No change, no nursing concerns     Physical Exam: BP (!) 156/114 (BP Location: Left Arm)   Pulse (!) 50   Temp (!) 97.3 F (36.3 C) (Axillary)   Resp 16   Ht 5\' 7"  (1.702 m)   Wt 70.4 kg   SpO2 97%   BMI 24.31 kg/m   Elderly adult male, sitting in recliner, sleeping, not very responsive, sluggishly opens eyes RRR, no murmurs, still 2+ lower extremity edema bilaterally, may be slightly better, JVP still visible with sitting upright Respiratory rate normal, lungs diminished Abdomen soft no tenderness palpation Attention diminished, affect blunted, judgment and insight cannot be assessed, severe generalized weakness    Data Reviewed: Basic metabolic panel shows creatinine down to 2.6  Family Communication: Wife at the  bedside    Disposition: Status is: Inpatient         Author: Alberteen Sam, MD 04/22/2023 5:00 PM  For on call review [email or web address].

## 2023-04-22 NOTE — Plan of Care (Signed)
  Problem: Clinical Measurements: Goal: Respiratory complications will improve Outcome: Progressing   

## 2023-04-22 NOTE — Care Management (Signed)
Spoke w [employer] liaison, as WC that was ordered on Friday has not been delivered to the room. Liaison states that they need a credit card on file, I provided her with the room phone number to call the patient to assess payment.  [employer] is contracted DME provider with [facility].

## 2023-04-22 NOTE — Plan of Care (Signed)
  Problem: Education: Goal: Ability to demonstrate management of disease process will improve Outcome: Progressing Goal: Ability to verbalize understanding of medication therapies will improve Outcome: Progressing Goal: Individualized Educational Video(s) Outcome: Progressing   Problem: Activity: Goal: Capacity to carry out activities will improve Outcome: Progressing   Problem: Cardiac: Goal: Ability to achieve and maintain adequate cardiopulmonary perfusion will improve Outcome: Progressing   Problem: Education: Goal: Knowledge of General Education information will improve Description: Including pain rating scale, medication(s)/side effects and non-pharmacologic comfort measures Outcome: Progressing   Problem: Health Behavior/Discharge Planning: Goal: Ability to manage health-related needs will improve Outcome: Progressing

## 2023-04-23 ENCOUNTER — Inpatient Hospital Stay (HOSPITAL_COMMUNITY): Payer: Medicare HMO

## 2023-04-23 ENCOUNTER — Encounter (HOSPITAL_COMMUNITY): Payer: Self-pay | Admitting: Family Medicine

## 2023-04-23 DIAGNOSIS — I10 Essential (primary) hypertension: Secondary | ICD-10-CM | POA: Diagnosis not present

## 2023-04-23 DIAGNOSIS — D45 Polycythemia vera: Secondary | ICD-10-CM | POA: Diagnosis not present

## 2023-04-23 DIAGNOSIS — N179 Acute kidney failure, unspecified: Secondary | ICD-10-CM | POA: Diagnosis not present

## 2023-04-23 DIAGNOSIS — I5043 Acute on chronic combined systolic (congestive) and diastolic (congestive) heart failure: Secondary | ICD-10-CM | POA: Diagnosis not present

## 2023-04-23 LAB — URINALYSIS, W/ REFLEX TO CULTURE (INFECTION SUSPECTED)
Bacteria, UA: NONE SEEN
Bilirubin Urine: NEGATIVE
Glucose, UA: NEGATIVE mg/dL
Ketones, ur: NEGATIVE mg/dL
Leukocytes,Ua: NEGATIVE
Nitrite: NEGATIVE
Protein, ur: NEGATIVE mg/dL
Specific Gravity, Urine: 1.01 (ref 1.005–1.030)
pH: 5 (ref 5.0–8.0)

## 2023-04-23 LAB — TROPONIN I (HIGH SENSITIVITY)
Troponin I (High Sensitivity): 16 ng/L (ref <18)
Troponin I (High Sensitivity): 17 ng/L (ref <18)

## 2023-04-23 LAB — LACTIC ACID, PLASMA
Lactic Acid, Venous: 3.5 mmol/L (ref 0.5–1.9)
Lactic Acid, Venous: 3.6 mmol/L (ref 0.5–1.9)

## 2023-04-23 LAB — BASIC METABOLIC PANEL
Anion gap: 12 (ref 5–15)
BUN: 65 mg/dL — ABNORMAL HIGH (ref 8–23)
CO2: 21 mmol/L — ABNORMAL LOW (ref 22–32)
Calcium: 8.8 mg/dL — ABNORMAL LOW (ref 8.9–10.3)
Chloride: 99 mmol/L (ref 98–111)
Creatinine, Ser: 2.9 mg/dL — ABNORMAL HIGH (ref 0.61–1.24)
GFR, Estimated: 21 mL/min — ABNORMAL LOW (ref >60)
Glucose, Bld: 79 mg/dL (ref 70–99)
Potassium: 5.6 mmol/L — ABNORMAL HIGH (ref 3.5–5.1)
Sodium: 132 mmol/L — ABNORMAL LOW (ref 135–145)

## 2023-04-23 LAB — PROCALCITONIN: Procalcitonin: 0.13 ng/mL

## 2023-04-23 MED ORDER — FUROSEMIDE 10 MG/ML IJ SOLN
8.0000 mg/h | INTRAVENOUS | Status: DC
  Administered 2023-04-23 – 2023-04-25 (×3): 8 mg/h via INTRAVENOUS
  Filled 2023-04-23 (×4): qty 20

## 2023-04-23 MED ORDER — HALOPERIDOL LACTATE 5 MG/ML IJ SOLN
2.0000 mg | Freq: Four times a day (QID) | INTRAMUSCULAR | Status: DC | PRN
  Administered 2023-04-23 – 2023-05-03 (×12): 2 mg via INTRAVENOUS
  Filled 2023-04-23 (×12): qty 1

## 2023-04-23 MED ORDER — METOLAZONE 5 MG PO TABS
5.0000 mg | ORAL_TABLET | Freq: Once | ORAL | Status: AC
  Administered 2023-04-23: 5 mg via ORAL
  Filled 2023-04-23: qty 1

## 2023-04-23 MED ORDER — FUROSEMIDE 10 MG/ML IJ SOLN
80.0000 mg | Freq: Two times a day (BID) | INTRAMUSCULAR | Status: DC
  Administered 2023-04-23: 80 mg via INTRAVENOUS
  Filled 2023-04-23: qty 8

## 2023-04-23 NOTE — Plan of Care (Signed)
?  Problem: Education: ?Goal: Ability to demonstrate management of disease process will improve ?Outcome: Progressing ?  ?

## 2023-04-23 NOTE — Progress Notes (Addendum)
Called to bedside for hypoxia.  After ambulating with mobility, patient noted to be hypoxic to the 70s.  BP (!) 141/94   Pulse 64   Temp (!) 97.5 F (36.4 C) (Oral)   Resp 18   Ht 5\' 7"  (1.702 m)   Wt 71.9 kg   SpO2 100%   BMI 24.83 kg/m    On my exam, he apeared out of breath and asked to "catch my breath" but did not have tachcyardia or tachypnea.  100% on 6L.  On lung exam, he was diminished severely, had poor air movement shallow breaths, but no rales or wheezes appreciated.  Heart sounds slow and very distant, no new obvious murmurs.  Still quite grossly fluid overloaded despite Lasix eralier today.    - Obtain CXR - Obtain lactate and troponins - Check ECG - Echo had been deferred given done in June, but will go ahead and repeat that   ADDENDUM: Lactate returned 3.5.  CXR without severe opacities, similar to previous.  I have not had concern for infection so far, but will add on: Procalcitonin Blood cultures UA/UCx  If febrile or develops tachycardia, will start antibiotics

## 2023-04-23 NOTE — Progress Notes (Signed)
Mobility Specialist Progress Note:    04/23/23 1458  Mobility  Activity Ambulated with assistance in hallway  Level of Assistance Minimal assist, patient does 75% or more  Assistive Device Front wheel walker  Distance Ambulated (ft) 64 ft  Activity Response Tolerated well  Mobility Referral Yes  $Mobility charge 1 Mobility  Mobility Specialist Start Time (ACUTE ONLY) 1400  Mobility Specialist Stop Time (ACUTE ONLY) 1416  Mobility Specialist Time Calculation (min) (ACUTE ONLY) 16 min   Received pt in chair requesting to ambulate in halls. Pt was asymptomatic throughout ambulation and returned to room w/o fault. Left in chair w/ call bell in reach and all needs met. RN in room.    Thompson Grayer Mobility Specialist  Please contact vis Secure Chat or  Rehab Office [phone]

## 2023-04-23 NOTE — Plan of Care (Signed)
  Problem: Education: Goal: Ability to demonstrate management of disease process will improve Outcome: Not Progressing Goal: Ability to verbalize understanding of medication therapies will improve Outcome: Not Progressing Goal: Individualized Educational Video(s) Outcome: Not Progressing   Problem: Activity: Goal: Capacity to carry out activities will improve Outcome: Not Progressing   Problem: Cardiac: Goal: Ability to achieve and maintain adequate cardiopulmonary perfusion will improve Outcome: Not Progressing   Problem: Education: Goal: Knowledge of General Education information will improve Description: Including pain rating scale, medication(s)/side effects and non-pharmacologic comfort measures Outcome: Not Progressing   Problem: Health Behavior/Discharge Planning: Goal: Ability to manage health-related needs will improve Outcome: Not Progressing   Problem: Clinical Measurements: Goal: Ability to maintain clinical measurements within normal limits will improve Outcome: Not Progressing Goal: Will remain free from infection Outcome: Not Progressing Goal: Diagnostic test results will improve Outcome: Not Progressing Goal: Respiratory complications will improve Outcome: Not Progressing Goal: Cardiovascular complication will be avoided Outcome: Not Progressing   Problem: Activity: Goal: Risk for activity intolerance will decrease Outcome: Not Progressing   Problem: Nutrition: Goal: Adequate nutrition will be maintained Outcome: Not Progressing   Problem: Coping: Goal: Level of anxiety will decrease Outcome: Not Progressing   Problem: Elimination: Goal: Will not experience complications related to bowel motility Outcome: Not Progressing Goal: Will not experience complications related to urinary retention Outcome: Not Progressing   Problem: Safety: Goal: Ability to remain free from injury will improve Outcome: Not Progressing   Problem: Skin Integrity: Goal:  Risk for impaired skin integrity will decrease Outcome: Not Progressing

## 2023-04-23 NOTE — Plan of Care (Signed)
  Problem: Activity: Goal: Risk for activity intolerance will decrease Outcome: Progressing   

## 2023-04-23 NOTE — Progress Notes (Addendum)
  Progress Note   Patient: James Bryant:[id number] DOB: 1946/02/18 DOA: 04/19/2023     2 DOS: the patient was seen and examined on 04/23/2023 at 8:05 AM      Brief hospital course: Mr. Berkey is a 77 y.o. M with dementia, HTN, sdCHF EF 20-25%, CKD IIIa baseline 1.6 and polycythemia who presented with CHF flare failed outpatient management.       Assessment and Plan: * Acute on chronic combined systolic and diastolic CHF (congestive heart failure) (HCC) Ins and outs not documented again, weight slightly up, creatinine slightly worse. Still markedly edematous and difficult to walk more than 5 feet - Continue IV Lasix, reduce back to 80 mg twice daily  - Potassium suppl - Strict I/Os, daily weights, telemetry  - Daily monitoring renal function     Acute renal failure superimposed on stage 3a chronic kidney disease (HCC) Cr >3 on admission.  Due to congestive nephropathy, initially resolving with diuretics Slight bump yesterday - Hold ARB - Reduce diuretics back to 80 twice daily  - Daily BMP while on diuretics  Severe dementia (HCC) - Continue donepezil/Zyprexa - PT/OT  Primary hypertension Blood pressure slightly elevated - Continue aspirin - Hold valsartan - Continue metoprolol  Polycythemia vera (HCC) - Continue hydroxyurea  Hyperkalemia - Continue Lasix -Hold ARB  Hyponatremia In the context of congestive heart failure, asymptomatic -Diuretics  Left knee effusion Without pain. Low suspicion for infection     Subjective: Patient wakes appointment makes eye contact, says he is doing "fine".  No nursing concerns.  Only able to walk a few steps with mobility specialist.  No fever or respiratory distress.  Still very edematous.     Physical Exam: BP (!) 158/141   Pulse (!) 50   Temp (!) 97.5 F (36.4 C) (Oral)   Resp 18   Ht 5\' 7"  (1.702 m)   Wt 71.9 kg   SpO2 99%   BMI 24.83 kg/m   Elderly adult male, sitting up in bed, sleeping, arouses  and makes eye contact, then falls back asleep Slow, regular, no murmurs, still 3+ peripheral edema, JVP elevated Respiratory rate seems unlabored, lung sounds diminished, no rales appreciated Abdomen soft Attention diminished, affect blunted, judgment and insight appear impaired     Data Reviewed: Basic metabolic panel shows creatinine up to 2.9   Family Communication: None present    Disposition: Status is: Inpatient         Author: Alberteen Sam, MD 04/23/2023 2:14 PM  For on call review [email or web address].

## 2023-04-23 NOTE — Progress Notes (Signed)
Mobility Specialist Progress Note:    04/23/23 1130  Mobility  Activity Ambulated with assistance in room  Level of Assistance Minimal assist, patient does 75% or more  Assistive Device Front wheel walker  Distance Ambulated (ft) 5 ft  Activity Response Tolerated well  Mobility Referral Yes  $Mobility charge 1 Mobility  Mobility Specialist Start Time (ACUTE ONLY) 0932  Mobility Specialist Stop Time (ACUTE ONLY) 0947  Mobility Specialist Time Calculation (min) (ACUTE ONLY) 15 min   Pt received in bed, requesting to move up in bed. Pt needed MinA w/ bed mobility and STS. Was able to take a few steps towards the head of the bed w/o fault. No c/o throughout. All needs met. Bed alarm on.   Thompson Grayer Mobility Specialist  Please contact vis Secure Chat or  Rehab Office [phone]

## 2023-04-23 NOTE — Progress Notes (Signed)
CRITICAL VALUE ALERT  Critical Value:  lactic acid 3.5   Date & Time Notied: 04/23/2023 1657   Provider Notified: MD Danford   Orders Received/Actions taken: MD paged cardiology

## 2023-04-23 NOTE — Consult Note (Signed)
CARDIOLOGY CONSULT NOTE  Patient ID: James Bryant MRN: [id number] DOB/AGE: March 13, 1946 77 y.o.  Admit date: 04/19/2023 Primary Physician Tysinger, Kermit Balo, PA-C Primary Cardiologist Tysinger, Kermit Balo, PA-C Chief Complaint  SOB, swelling Requesting  Dr. Maryfrances Bunnell  HPI: The patient has a history of non ischemic cardiomyopathy.      The patient was admitted on 829 with increased volume.  He had increased lethargy and has a baseline dementia over a couple of weeks.  He has been in his primary care office in mid July with an elevated creatinine and elevated BNP.  His diuretic was increased at that time.  However, without significant improvement he was referred to the ED.  In the emergency room creatinine was 3.07.  This is up from 1.63.  Atelectasis was noted on chest x-ray.  He seemed to initially be making some progress with 120 mg IV twice daily Lasix.  Creatinine actually trended down a little bit.  He was ambulating a few feet with mobility specialist.  However, today he was noted to have increased shortness of breath and decreased breath sounds on exam this afternoon.  He was noted to be 100 sounds 6 L O2.  Chest x-ray was unchanged from earlier in the hospitalization.  His creatinine started to rise again.  Lactic acid was 3.5. He actually had his diuretic dose reduced today because of this.  We are called to assist with his worsening hepatorenal syndrome.  The patient does not give answers other than to deny pain or shortness of breath.  However, his daughter is on the phone.  His wife is in the room.  He did speak in the room with Dr. Maryfrances Bunnell.  Does not appear that his mentation is changed.  He did have sats in the 70s apparently when he ambulated on room air earlier.  He is currently oxygenating well.  There is been no obvious chest pressure or neck discomfort.  This has been elevated about 30 degrees and this is not changed.  He has marked diffuse edema.   Past Medical History:   Diagnosis Date   Asthma    Calculus of gallbladder without cholecystitis without obstruction 10/18/2022   CHF (congestive heart failure) (HCC)    Dementia (HCC)    Elevated liver function tests 01/17/2019   Elevated serum creatinine 01/17/2019   Fear of needles 01/17/2019   Former smoker, stopped smoking many years ago 01/17/2019   Hypertension    Polycythemia 01/17/2019   Thrombocytosis 01/17/2019    History reviewed. No pertinent surgical history.  No Known Allergies Medications Prior to Admission  Medication Sig Dispense Refill Last Dose   albuterol (PROVENTIL) (2.5 MG/3ML) 0.083% nebulizer solution Take 3 mLs (2.5 mg total) by nebulization every 6 (six) hours as needed for wheezing or shortness of breath. 150 mL 1 unknown   ASPIRIN LOW DOSE 81 MG tablet TAKE 1 TABLET EVERY DAY 90 tablet 3 04/19/2023   donepezil (ARICEPT) 10 MG tablet Take 1 tablet (10 mg total) by mouth at bedtime. 30 tablet 11 04/18/2023   ferrous gluconate (FERGON) 324 MG tablet Take 1 tablet (324 mg total) by mouth daily with breakfast. 90 tablet 1 04/19/2023   fluticasone furoate-vilanterol (BREO ELLIPTA) 200-25 MCG/ACT AEPB Inhale 1 puff into the lungs daily.   unknown   furosemide (LASIX) 80 MG tablet Take 1 tablet (80 mg total) by mouth 2 (two) times daily. 180 tablet 0 04/19/2023   hydroxyurea (HYDREA) 500 MG capsule Take 1 capsule (500  mg total) by mouth 2 (two) times daily. May take with food to minimize GI side effects. 180 capsule 1 04/19/2023   metoprolol succinate (TOPROL-XL) 25 MG 24 hr tablet Take 1 tablet (25 mg total) by mouth daily. 90 tablet 0 04/18/2023 at 1600   Multiple Vitamins-Minerals (MEGA MULTIVITAMIN FOR MEN PO) Take 1 tablet by mouth daily.   04/19/2023   nystatin cream (MYCOSTATIN) Apply 1 Application topically as needed.   unknown   OLANZapine (ZYPREXA) 5 MG tablet Take 1 tablet (5 mg total) by mouth at bedtime. 90 tablet 1 04/18/2023   potassium chloride (KLOR-CON) 10 MEQ tablet Take 1  tablet (10 mEq total) by mouth 2 (two) times daily. 180 tablet 0 04/19/2023   brimonidine (ALPHAGAN) 0.2 % ophthalmic solution SMARTSIG:In Eye(s) (Patient not taking: Reported on 04/19/2023)   Not Taking   valsartan (DIOVAN) 160 MG tablet TAKE 1 TABLET EVERY DAY (Patient not taking: Reported on 04/19/2023) 90 tablet 3 Not Taking   Family History  Problem Relation Age of Onset   Heart disease Mother    Hypertension Mother    Alzheimer's disease Neg Hx    Dementia Neg Hx     Social History   Socioeconomic History   Marital status: Married    Spouse name: Not on file   Number of children: Not on file   Years of education: Not on file   Highest education level: 12th grade  Occupational History   Not on file  Tobacco Use   Smoking status: Former   Smokeless tobacco: Never  Vaping Use   Vaping status: Never Used  Substance and Sexual Activity   Alcohol use: No    Comment: stopped many years ago    Drug use: Never   Sexual activity: Not on file  Other Topics Concern   Not on file  Social History Narrative   Married. Retired Naval architect. Stopped smoking at age 49, smoked since age 2. Stopped alcohol use at age 71. Moved here from [place], [place] recently.    Extreme fear of needles.    Social Determinants of Health   Financial Resource Strain: Low Risk  (04/09/2023)   Overall Financial Resource Strain (CARDIA)    Difficulty of Paying Living Expenses: Not hard at all  Food Insecurity: No Food Insecurity (04/19/2023)   Hunger Vital Sign    Worried About Running Out of Food in the Last Year: Never true    Ran Out of Food in the Last Year: Never true  Transportation Needs: No Transportation Needs (04/19/2023)   PRAPARE - Administrator, Civil Service (Medical): No    Lack of Transportation (Non-Medical): No  Physical Activity: Unknown (04/09/2023)   Exercise Vital Sign    Days of Exercise per Week: 0 days    Minutes of Exercise per Session: Not on file  Stress: No  Stress Concern Present (04/09/2023)   [employer] of Occupational Health - Occupational Stress Questionnaire    Feeling of Stress : Not at all  Social Connections: Unknown (04/09/2023)   Social Connection and Isolation Panel [NHANES]    Frequency of Communication with Friends and Family: Once a week    Frequency of Social Gatherings with Friends and Family: Once a week    Attends Religious Services: Patient declined    Database administrator or Organizations: No    Attends Engineer, structural: Not on file    Marital Status: Not on file  Intimate Partner Violence: Not  At Risk (04/19/2023)   Humiliation, Afraid, Rape, and Kick questionnaire    Fear of Current or Ex-Partner: No    Emotionally Abused: No    Physically Abused: No    Sexually Abused: No     ROS:  Unable to obtain from the patient because of severe dementia.  Physical Exam: Blood pressure (!) 104/58, pulse 64, temperature (!) 96.6 F (35.9 C), resp. rate 18, height 5\' 7"  (1.702 m), weight 71.9 kg, SpO2 96%.  GENERAL: Chronically ill appearing HEENT:  Pupils equal round and reactive, fundi not visualized, oral mucosa unremarkable NECK:  Jugular venous distention to the angle of the jaw at 30 degrees, waveform within normal limits, carotid upstroke brisk and symmetric, no bruits, no thyromegaly LYMPHATICS:  No cervical, inguinal adenopathy LUNGS:  Clear to auscultation bilaterally BACK:  No CVA tenderness CHEST:  Unremarkable HEART:  PMI not displaced or sustained,S1 and S2 within normal limits, no S3, no S4, no clicks, no rubs, 2 out of 6 apical and axillary holosystolic murmur, no diastolic murmurs ABD:  Flat, positive bowel sounds normal in frequency in pitch, no bruits, no rebound, no guarding, no midline pulsatile mass, positive hepatomegaly, no splenomegaly EXT:  2 plus pulses throughout, severe diffuse edema, no cyanosis no clubbing SKIN:  No rashes no nodules NEURO:  Cranial nerves II through XII  grossly intact, motor grossly intact throughout PSYCH:  Cognitively intact, oriented to person place and time   Labs: Lab Results  Component Value Date   BUN 65 (H) 04/23/2023   Lab Results  Component Value Date   CREATININE 2.90 (H) 04/23/2023   Lab Results  Component Value Date   NA 132 (L) 04/23/2023   K 5.6 (H) 04/23/2023   CL 99 04/23/2023   CO2 21 (L) 04/23/2023   No results found for: "TROPONINI" Lab Results  Component Value Date   WBC 3.7 (L) 04/21/2023   HGB 12.1 (L) 04/21/2023   HCT 38.1 (L) 04/21/2023   MCV 129.6 (H) 04/21/2023   PLT 167 04/21/2023   Lab Results  Component Value Date   CHOL 95 (L) 08/31/2022   HDL 26 (L) 08/31/2022   LDLCALC 57 08/31/2022   TRIG 45 08/31/2022   CHOLHDL 3.7 08/31/2022   Lab Results  Component Value Date   ALT 23 04/21/2023   AST 41 04/21/2023   ALKPHOS 108 04/21/2023   BILITOT 2.6 (H) 04/21/2023   ECHO:  02/13/23   1. Left ventricular ejection fraction, by estimation, is 20 to 25%. Left  ventricular ejection fraction by PLAX is 21 %. The left ventricle has  severely decreased function. The left ventricle demonstrates global  hypokinesis. The left ventricular internal   cavity size was mildly dilated. Left ventricular diastolic parameters are  consistent with Grade II diastolic dysfunction (pseudonormalization).   2. Right ventricular systolic function is normal. The right ventricular  size is normal.   3. Left atrial size was severely dilated.   4. Right atrial size was severely dilated.   5. The mitral valve is normal in structure. Mild mitral valve  regurgitation. No evidence of mitral stenosis.   6. Tricuspid valve regurgitation is mild to moderate.   7. The aortic valve is tricuspid. Aortic valve regurgitation is mild. No  aortic stenosis is present. Aortic regurgitation PHT measures 463 msec.   8. Pulmonic valve regurgitation is moderate.   9. The inferior vena cava is normal in size with greater than 50%   respiratory variability, suggesting right atrial pressure  of 3 mmHg.    Radiology:   CXR: Small left pleural effusion with mild atelectasis.  EKG: 04/23/2023 sinus rhythm, leftward axis, no acute ST-T wave changes.  ASSESSMENT AND PLAN:   Acute on chronic systolic heart failure:    He appears to have low output failure with acute on chronic renal insufficiency.  He has had some labile blood pressures recorded but overall they have been elevated.  However, when I repeated these his systolics 110 which I think is in keeping with his current cuff pressure.  Tonight him and try starting an IV Lasix drip, putting on thigh-high compression and keeping his feet elevated to see if we have improvement in his diuresis and stabilization or even improvement in his renal function.  If not I would suggest tomorrow PICC line and perhaps inotropic therapy.  I also think would be reasonable have goals of therapy discussions given his chronic dementia and baseline low functional level.  I will defer to the primary team  AKI (On CKD): Creatinine is risen.  Continuing to hold ARB.  Plan as above  HTN: Management is in the context of managing his acute on chronic systolic heart failure.   SignedGarrin Epps 04/23/2023, 4:57 PM

## 2023-04-24 ENCOUNTER — Telehealth: Payer: Self-pay | Admitting: Medical

## 2023-04-24 ENCOUNTER — Inpatient Hospital Stay (HOSPITAL_COMMUNITY): Payer: Medicare HMO

## 2023-04-24 DIAGNOSIS — N179 Acute kidney failure, unspecified: Secondary | ICD-10-CM | POA: Diagnosis not present

## 2023-04-24 DIAGNOSIS — I5043 Acute on chronic combined systolic (congestive) and diastolic (congestive) heart failure: Secondary | ICD-10-CM | POA: Diagnosis not present

## 2023-04-24 DIAGNOSIS — D45 Polycythemia vera: Secondary | ICD-10-CM | POA: Diagnosis not present

## 2023-04-24 DIAGNOSIS — I10 Essential (primary) hypertension: Secondary | ICD-10-CM | POA: Diagnosis not present

## 2023-04-24 LAB — CBC
HCT: 31.6 % — ABNORMAL LOW (ref 39.0–52.0)
Hemoglobin: 10.5 g/dL — ABNORMAL LOW (ref 13.0–17.0)
MCH: 42.3 pg — ABNORMAL HIGH (ref 26.0–34.0)
MCHC: 33.2 g/dL (ref 30.0–36.0)
MCV: 127.4 fL — ABNORMAL HIGH (ref 80.0–100.0)
Platelets: 150 10*3/uL (ref 150–400)
RBC: 2.48 MIL/uL — ABNORMAL LOW (ref 4.22–5.81)
RDW: 22.6 % — ABNORMAL HIGH (ref 11.5–15.5)
WBC: 3.6 10*3/uL — ABNORMAL LOW (ref 4.0–10.5)
nRBC: 2.5 % — ABNORMAL HIGH (ref 0.0–0.2)

## 2023-04-24 LAB — LACTIC ACID, PLASMA
Lactic Acid, Venous: 2.9 mmol/L (ref 0.5–1.9)
Lactic Acid, Venous: 3.4 mmol/L (ref 0.5–1.9)

## 2023-04-24 LAB — BASIC METABOLIC PANEL
Anion gap: 16 — ABNORMAL HIGH (ref 5–15)
BUN: 69 mg/dL — ABNORMAL HIGH (ref 8–23)
CO2: 22 mmol/L (ref 22–32)
Calcium: 9 mg/dL (ref 8.9–10.3)
Chloride: 98 mmol/L (ref 98–111)
Creatinine, Ser: 2.88 mg/dL — ABNORMAL HIGH (ref 0.61–1.24)
GFR, Estimated: 22 mL/min — ABNORMAL LOW (ref >60)
Glucose, Bld: 80 mg/dL (ref 70–99)
Potassium: 5.1 mmol/L (ref 3.5–5.1)
Sodium: 136 mmol/L (ref 135–145)

## 2023-04-24 LAB — ECHOCARDIOGRAM COMPLETE
AR max vel: 1.66 cm2
AV Area VTI: 1.61 cm2
AV Area mean vel: 1.68 cm2
AV Mean grad: 3 mmHg
AV Peak grad: 4.5 mmHg
Ao pk vel: 1.06 m/s
Area-P 1/2: 4.78 cm2
Est EF: <20
Height: 67 in
P 1/2 time: 325 ms
S' Lateral: 5.5 cm
Weight: 2515.01 [oz_av]

## 2023-04-24 MED ORDER — PERFLUTREN LIPID MICROSPHERE
1.0000 mL | INTRAVENOUS | Status: AC | PRN
  Administered 2023-04-24: 2 mL via INTRAVENOUS
  Filled 2023-04-24: qty 10

## 2023-04-24 NOTE — Progress Notes (Addendum)
Physical Therapy Treatment Patient Details Name: James Bryant MRN: [id number] DOB: [birth year] Today's Date: 04/24/2023   History of Present Illness Pt is a 77 y/o male presenting with abnormal labs, edema and lethargy.  Work up includes acute on chronic combined systolic and diastolic CHF.  PMHx:  CHF, Dementia, HTN    PT Comments  Pt with decreased alertness in today's session as pt would close eyes and respond minimally to questions. Pt was MinA with all mobility with RW and 1L [place]. Pt was able to stand while RN performed pericare, however, pt became fatigued. Able to ambulate 4 feet to chair with MinA as pt was unsteady. Upon sitting, pt's UE began shaking with SpO2 monitor reading 82 (see below). Pt bumped to 6L [place] to recover. Pt left in chair with sitter present with SpO2 reading 100 on 1L [place]. Acute PT to follow.     If plan is discharge home, recommend the following: A little help with walking and/or transfers;Assistance with cooking/housework;Assist for transportation;Assistance with feeding;Help with stairs or ramp for entrance;A little help with bathing/dressing/bathroom           Precautions / Restrictions Precautions Precautions: Fall Restrictions Weight Bearing Restrictions: No     Mobility  Bed Mobility Overal bed mobility: Needs Assistance Bed Mobility: Supine to Sit     Supine to sit: Min assist, HOB elevated, Used rails     General bed mobility comments: MinA to bring hips forward to the EOB    Transfers Overall transfer level: Needs assistance Equipment used: Rolling walker (2 wheels) Transfers: Sit to/from Stand Sit to Stand: Min assist           General transfer comment: increased time, MinA for initial rise and unsteadiness upon standing. Cues for upright posture as pt tended to lean posteriorly and close eyes.    Ambulation/Gait Ambulation/Gait assistance: Min assist Gait Distance (Feet): 4 Feet Assistive device: Rolling walker (2 wheels) Gait  Pattern/deviations: Step-through pattern, Decreased stride length, Shuffle, Trunk flexed, Leaning posteriorly Gait velocity: dec     General Gait Details: MinA for stability as pt is mildly unsteady, pt takes short steps barely clearing the ground. Pt had difficulty opening eyes and plopped into the chair at end of transfer         Balance Overall balance assessment: Needs assistance Sitting-balance support: Feet supported, Bilateral upper extremity supported Sitting balance-Leahy Scale: Poor Sitting balance - Comments: leans posteriorly requiring verbal and tactile cues Postural control: Posterior lean Standing balance support: Bilateral upper extremity supported, Reliant on assistive device for balance, During functional activity Standing balance-Leahy Scale: Poor Standing balance comment: after standing ~1 min for RN to perform pericare, pt's LE began to shake. MinA for stability with RW                            Cognition Arousal: Obtunded Behavior During Therapy: Flat affect Overall Cognitive Status: History of cognitive impairments - at baseline                                 General Comments: A&Ox1, pt kept eyes closed more than half of the session, impulsive with movements        Exercises General Exercises - Lower Extremity Ankle Circles/Pumps: AROM, Seated, Both, 10 reps Hip ABduction/ADduction: AROM, Seated, Both, 10 reps    General Comments General comments (skin integrity, edema, etc.):  On 1L [place] at beginning of session. Pt ambulated 4 ft with RW to chair. Upon sitting, pt UE began to shake with pt minimally responding to questions. Pt denied SOB, dizziness or lightheadedness. SpO2 monitor read at 82, however, pt continued to deny symptoms. Bumped O2 to 6L [place] to recover, SpO2 reading at 100 with forehead SpO2 monitor. Resumed 1L [place] with sitter in room.      Pertinent Vitals/Pain Pain Assessment Pain Assessment: No/denies pain    Home  Living                          Prior Function            PT Goals (current goals can now be found in the care plan section) Acute Rehab PT Goals Patient Stated Goal: home safe with needed equipment PT Goal Formulation: Patient unable to participate in goal setting Time For Goal Achievement: 05/04/23 Potential to Achieve Goals: Good Progress towards PT goals: Progressing toward goals    Frequency    Min 1X/week      PT Plan      Co-evaluation              AM-PAC PT "6 Clicks" Mobility   Outcome Measure  Help needed turning from your back to your side while in a flat bed without using bedrails?: A Little Help needed moving from lying on your back to sitting on the side of a flat bed without using bedrails?: A Little Help needed moving to and from a bed to a chair (including a wheelchair)?: A Little Help needed standing up from a chair using your arms (e.g., wheelchair or bedside chair)?: A Little Help needed to walk in hospital room?: A Little Help needed climbing 3-5 steps with a railing? : A Lot 6 Click Score: 17    End of Session Equipment Utilized During Treatment: Gait belt Activity Tolerance: Patient limited by fatigue;Patient limited by lethargy Patient left: with call bell/phone within reach;in chair;with chair alarm set Nurse Communication: Mobility status PT Visit Diagnosis: Unsteadiness on feet (R26.81);Muscle weakness (generalized) (M62.81);Difficulty in walking, not elsewhere classified (R26.2)     Time: 1610-9604 PT Time Calculation (min) (ACUTE ONLY): 37 min  Charges:    $Therapeutic Exercise: 8-22 mins $Therapeutic Activity: 8-22 mins PT General Charges $$ ACUTE PT VISIT: 1 Visit                     Hilton Cork, PT, DPT Secure Chat Preferred  Rehab Office [phone]    Arturo Morton Brion Aliment 04/24/2023, 1:12 PM

## 2023-04-24 NOTE — Telephone Encounter (Signed)
Pt daughter sent fmla paperwork to be filled out as pt dad is in hosp Please call when ready

## 2023-04-24 NOTE — Telephone Encounter (Signed)
I do not have forms on pt

## 2023-04-24 NOTE — Plan of Care (Signed)
  Problem: Education: Goal: Ability to demonstrate management of disease process will improve Outcome: Progressing Goal: Ability to verbalize understanding of medication therapies will improve Outcome: Progressing Goal: Individualized Educational Video(s) Outcome: Progressing   Problem: Activity: Goal: Capacity to carry out activities will improve Outcome: Progressing   Problem: Cardiac: Goal: Ability to achieve and maintain adequate cardiopulmonary perfusion will improve Outcome: Progressing   Problem: Education: Goal: Knowledge of General Education information will improve Description: Including pain rating scale, medication(s)/side effects and non-pharmacologic comfort measures Outcome: Progressing   Problem: Health Behavior/Discharge Planning: Goal: Ability to manage health-related needs will improve Outcome: Progressing   Problem: Clinical Measurements: Goal: Ability to maintain clinical measurements within normal limits will improve Outcome: Progressing Goal: Will remain free from infection Outcome: Progressing Goal: Diagnostic test results will improve Outcome: Progressing Goal: Respiratory complications will improve Outcome: Progressing Goal: Cardiovascular complication will be avoided Outcome: Progressing   Problem: Activity: Goal: Risk for activity intolerance will decrease Outcome: Progressing   Problem: Nutrition: Goal: Adequate nutrition will be maintained Outcome: Progressing   Problem: Coping: Goal: Level of anxiety will decrease Outcome: Progressing   Problem: Elimination: Goal: Will not experience complications related to bowel motility Outcome: Progressing Goal: Will not experience complications related to urinary retention Outcome: Progressing   Problem: Safety: Goal: Ability to remain free from injury will improve Outcome: Progressing   Problem: Skin Integrity: Goal: Risk for impaired skin integrity will decrease Outcome: Progressing    

## 2023-04-24 NOTE — Progress Notes (Signed)
Family came in at 1130 brung him lunch (chick-fil-a). Sandwich/fries/lemonade

## 2023-04-24 NOTE — Progress Notes (Signed)
Mobility Specialist Progress Note:    04/24/23 1425  Mobility  Activity Transferred from chair to bed  Level of Assistance Minimal assist, patient does 75% or more  Assistive Device Front wheel walker  Distance Ambulated (ft) 5 ft  Activity Response Tolerated well  Mobility Referral Yes  $Mobility charge 1 Mobility  Mobility Specialist Start Time (ACUTE ONLY) 1335  Mobility Specialist Stop Time (ACUTE ONLY) 1345  Mobility Specialist Time Calculation (min) (ACUTE ONLY) 10 min   Pt received in chair requesting to get back to bed. MinA fr STS and contact guard for ambulation. Pt was able to take a couple of steps towards the bed and some side steps at the bed w/o fault. Situated in bed w/ call bell and personal belongings in reach. All needs met.  Thompson Grayer Mobility Specialist  Please contact vis Secure Chat or  Rehab Office [phone]

## 2023-04-24 NOTE — Progress Notes (Signed)
PROGRESS NOTE    James Bryant  VFI:[id number] DOB: [birth year] DOA: 04/19/2023 PCP: Jac Canavan, PA-C    Brief Narrative:  [name] is a 77 y.o. male with past medical history of dementia, HTN, CHF EF 20-25%, CKD IIIa baseline 1.6 and polycythemia initially presented to hospital with abnormal labs edema lethargy weight gain for 1 to 2 weeks prior to presentation.  Patient was then admitted hospital for CHF flare failed outpatient management.  Assessment and Plan:  * Acute on chronic combined systolic and diastolic CHF  Continue strict intake and output charting.  Negative balance for 2192 mL so far.   edematous and difficult to ambulate more than 5 feet.  On IV Lasix drip at this time.  Strict intake and output charting Daily weights.  Check 2D echocardiogram.  Previous 2D echocardiogram from 02/13/2023 shows EF of 20 to 25%.  Has advanced heart failure and does not qualify for advanced heart failure treatment due to advanced age dementia and chronic kidney disease.  Lactate is elevated at 3.4 today.  Acute hypoxia.  Saturation were upto 70s and required supplemental oxygen up to 6 L yesterday.  Chest x-ray with cardiomegaly and small left pleural effusion.  Low lung volumes with bibasilar atelectasis.  Procalcitonin 0.13.  Blood cultures negative in less than 24 hours.  Lactate was elevated at 3.6 yesterday.  If fever, will consider antibiotic.  Currently on 1 L of oxygen by nasal cannula.   Acute renal failure superimposed on stage 3a chronic kidney disease  Creatinine more than 3 on admission.  Creatinine today at 2.8.  On IV Lasix drip.  Check daily BMP.  Due to congestive nephropathy, initially resolving with diuretics ARB on hold.  Continue BMP monitoring.  Severe dementia with behavioral disturbances and agitation - Continue donepezil/Zyprexa. PT/OT urinalysis without any infection.   Primary hypertension Continue aspirin and metoprolol.  Hold ARB.  Blood pressure seems to  be stable.   Polycythemia vera (HCC) - Continue hydroxyurea   Hyperkalemia ARB on hold.  On  IV Lasix.  Latest potassium of 5.1.  Initial potassium of 5.6.   Hyponatremia Improved latest sodium of 136.   Left knee effusion Without pain. Low suspicion for infection  Goals of care.  Patient with low EF with dementia.  Patient used to be in hospice in the past but currently not on hospice.  Family okay with palliative care but not hospice and want to proceed with what can be done to help him live but not to be aggressive with him.  Did have a prolonged discussion with patient's daughter about it.   DVT prophylaxis: Place TED hose Start: 04/23/23 1752 enoxaparin (LOVENOX) injection 30 mg Start: 04/19/23 1600   Code Status:     Code Status: Full Code  Disposition: Uncertain at this time.  Pending clinical improvement.  Status is: Inpatient  Remains inpatient appropriate because: Advanced heart failure, Lasix drip, pending clinical improvement   Family Communication: Spoke with the patient's daughter at length on 04/24/2023  Consultants:  Cardiology  Procedures:  None  Antimicrobials:  None  Anti-infectives (From admission, onward)    None       Subjective: Today, patient was seen and examined at bedside.  Patient mostly nonverbal.  utteering few words.  Patient's daughter at bedside.  Objective: Vitals:   04/24/23 0448 04/24/23 0732 04/24/23 1114 04/24/23 1118  BP:  (!) 124/92 (!) 112/93 (!) 112/93  Pulse:  74 69 74  Resp:  18 20  20  Temp:  (!) 97.3 F (36.3 C) (!) 97.4 F (36.3 C) (!) 97.4 F (36.3 C)  TempSrc:  Axillary Axillary Axillary  SpO2:  100% 100% 100%  Weight: 71.3 kg     Height:        Intake/Output Summary (Last 24 hours) at 04/24/2023 1510 Last data filed at 04/24/2023 1445 Gross per 24 hour  Intake 287.91 ml  Output 2100 ml  Net -1812.09 ml   Filed Weights   04/23/23 0700 04/23/23 0723 04/24/23 0448  Weight: 73 kg 71.9 kg 71.3 kg     Physical Examination: Body mass index is 24.62 kg/m.   General:  Average built, not in obvious distress, mostly nonverbal, uttering few words, appears weak deconditioned and frail. HENT:   Distended neck vein.  Chest:    Diminished breath sounds bilaterally.  Coarse breath sounds noted CVS: S1 &S2 heard. No murmur.  Regular rate and rhythm. Abdomen: Soft, nontender, nondistended.  Bowel sounds are heard.   Extremities: No cyanosis, clubbing with bilateral lower extremity edema noted, on compression stockings, cool extremities Psych: Alert, awake and mildly Communicative, minimally verbal, Vance dementia, CNS:  No cranial nerve deficits.  Moving all extremities Skin: Warm and dry.  No rashes noted.  Data Reviewed:   CBC: Recent Labs  Lab 04/19/23 1645 04/20/23 0726 04/21/23 0335 04/24/23 0907  WBC 4.2 3.4* 3.7* 3.6*  HGB 11.0* 11.9* 12.1* 10.5*  HCT 33.0* 37.5* 38.1* 31.6*  MCV 126.0* 128.9* 129.6* 127.4*  PLT 144* 144* 167 150    Basic Metabolic Panel: Recent Labs  Lab 04/18/23 1247 04/19/23 1126 04/20/23 0726 04/21/23 0335 04/22/23 0352 04/23/23 0508 04/24/23 0403  NA 141   < > 137 135 135 132* 136  K 4.8   < > 4.6 4.4 5.0 5.6* 5.1  CL 101   < > 102 97* 99 99 98  CO2 21   < > 20* 24 20* 21* 22  GLUCOSE 84   < > 88 93 88 79 80  BUN 57*   < > 59* 58* 60* 65* 69*  CREATININE 2.92*   < > 2.99* 2.79* 2.59* 2.90* 2.88*  CALCIUM 9.4   < > 9.1 9.2 9.0 8.8* 9.0  MG 2.6*  --  2.7*  --   --   --   --    < > = values in this interval not displayed.    Liver Function Tests: Recent Labs  Lab 04/20/23 0726 04/21/23 0335  AST 37 41  ALT 21 23  ALKPHOS 87 108  BILITOT 2.9* 2.6*  PROT 7.2 7.6  ALBUMIN 3.4* 3.5     Radiology Studies: DG CHEST PORT 1 VIEW  Result Date: 04/23/2023 CLINICAL DATA:  Hypoxia. EXAM: PORTABLE CHEST 1 VIEW COMPARISON:  Chest x-ray 04/10/2023. FINDINGS: Enlarged cardiac silhouette. Small left pleural effusion. Overlying streaky left  basilar opacities, likely atelectasis. No confluent consolidation. No visible pneumothorax. No evidence of acute bony abnormality. Polyarticular degenerative change. IMPRESSION: 1. Small left pleural effusion with suspected mild overlying atelectasis. 2. Cardiomegaly. Electronically Signed   By: Feliberto Harts M.D.   On: 04/23/2023 17:09      LOS: 3 days    Joycelyn Das, MD [facility] Available via Epic secure chat 7am-7pm After these hours, please refer to coverage provider listed on [email or web address] 04/24/2023, 3:10 PM

## 2023-04-24 NOTE — Progress Notes (Addendum)
Patient Name: James Bryant Date of Encounter: 04/24/2023 [facility] Cardiologist: Kristeen Miss, MD   Interval Summary  .    Patient seen just before starting his physical therapy session today. He has no focal complaints this morning. Shortness of breath reported stable and he is not having any chest pain.   Vital Signs .    Vitals:   04/24/23 0000 04/24/23 0359 04/24/23 0448 04/24/23 0732  BP: (!) 166/146 (!) 150/111  (!) 124/92  Pulse: 69 70  74  Resp:  14  18  Temp:  98.2 F (36.8 C)  (!) 97.3 F (36.3 C)  TempSrc:  Oral  Axillary  SpO2: 100% 100%  100%  Weight:   71.3 kg   Height:        Intake/Output Summary (Last 24 hours) at 04/24/2023 1000 Last data filed at 04/24/2023 0814 Gross per 24 hour  Intake 287.91 ml  Output 1650 ml  Net -1362.09 ml      04/24/2023    4:48 AM 04/23/2023    7:23 AM 04/23/2023    7:00 AM  Last 3 Weights  Weight (lbs) 157 lb 3 oz 158 lb 8.2 oz 160 lb 15 oz  Weight (kg) 71.3 kg 71.9 kg 73 kg      Telemetry/ECG    Sinus rhythm - Personally Reviewed  Physical Exam .   GEN: No acute distress. Frail and chronically ill appearing   Neck: JVD appears to be elevated though exam limited with patient sitting up on edge of bed with PT.  Cardiac: RRR, no murmurs, rubs, or gallops.  Respiratory: Generally diminished but with poor inspiratory effort GI: Soft, nontender, non-distended  MS: moderate-severe edema present in lower extremities. Both in compression stockings. Extremities do feel cool.   Assessment & Plan .     Acute on chronic systolic CHF  Patient admitted on 8/29 with increased lethargy, evidence of fluid retention, and elevated creatinine. BNP found elevated to >4500. He was initially responding well to high dose IV lasix but on 9/2 noted to have worsening dyspnea and decreased breath sounds. Lactic acid elevated to 3.5.  Patient now on lasix infusion at 8mg /hr with stable creatinine. He still has evidence of lower  extremity edema but shows improvement with ~1352mL output in the last 24 hours. Respiratory status improved with 100% O2 saturation on 1LPM, down from 6 yesterday afternoon. Continue infusion Patient still borderline low output. Will repeat lactic acid today (was 3.6 at 19:34 on 9/2). If still elevated, would need to establish PICC and consider use of inotrope.  Would also need to stop Metoprolol if lactic elevated. GDMT severely limited by AKI on CKD and labile BP TTE pending today, will follow Overall difficult situation with co-morbidities and would suggest palliative care involvement to better establish goals of care.   Hypertension   Patient with labile BP this admission. Labs and physical exam concerning for low output. Valsartan held but metoprolol succinate continued. As above, if ongoing evidence of low output, would stop beta blocker.   Per primary team: Dementia Polycythemia vera   For questions or updates, please contact [facility] Please consult [email or web address] for contact info under   Signed, Perlie Gold, PA-C   Patient seen and examined, note reviewed with the signed Advanced Practice Provider. I personally reviewed laboratory data, imaging studies and relevant notes. I independently examined the patient and formulated the important aspects of the plan. I have personally discussed the plan with the patient  and/or family. Comments or changes to the note/plan are indicated below.  Continue on the Lasix drip.  Pending repeat lactate.  If lactic is still elevated we will go ahead and get a PICC line.   Thomasene Ripple DO, MS [facility] Attending Cardiologist [facility]  [address] [place], [place] [address] [phone] Website: [email or web address]

## 2023-04-24 NOTE — Plan of Care (Signed)
?  Problem: Education: ?Goal: Ability to demonstrate management of disease process will improve ?Outcome: Progressing ?  ?

## 2023-04-25 DIAGNOSIS — D45 Polycythemia vera: Secondary | ICD-10-CM | POA: Diagnosis not present

## 2023-04-25 DIAGNOSIS — F03C18 Unspecified dementia, severe, with other behavioral disturbance: Secondary | ICD-10-CM | POA: Diagnosis not present

## 2023-04-25 DIAGNOSIS — I5043 Acute on chronic combined systolic (congestive) and diastolic (congestive) heart failure: Secondary | ICD-10-CM | POA: Diagnosis not present

## 2023-04-25 DIAGNOSIS — I10 Essential (primary) hypertension: Secondary | ICD-10-CM | POA: Diagnosis not present

## 2023-04-25 DIAGNOSIS — N179 Acute kidney failure, unspecified: Secondary | ICD-10-CM | POA: Diagnosis not present

## 2023-04-25 LAB — CBC
HCT: 32.5 % — ABNORMAL LOW (ref 39.0–52.0)
Hemoglobin: 10.6 g/dL — ABNORMAL LOW (ref 13.0–17.0)
MCH: 40.5 pg — ABNORMAL HIGH (ref 26.0–34.0)
MCHC: 32.6 g/dL (ref 30.0–36.0)
MCV: 124 fL — ABNORMAL HIGH (ref 80.0–100.0)
Platelets: 146 10*3/uL — ABNORMAL LOW (ref 150–400)
RBC: 2.62 MIL/uL — ABNORMAL LOW (ref 4.22–5.81)
RDW: 22.4 % — ABNORMAL HIGH (ref 11.5–15.5)
WBC: 3 10*3/uL — ABNORMAL LOW (ref 4.0–10.5)
nRBC: 2 % — ABNORMAL HIGH (ref 0.0–0.2)

## 2023-04-25 LAB — BASIC METABOLIC PANEL
Anion gap: 15 (ref 5–15)
BUN: 68 mg/dL — ABNORMAL HIGH (ref 8–23)
CO2: 27 mmol/L (ref 22–32)
Calcium: 9.1 mg/dL (ref 8.9–10.3)
Chloride: 94 mmol/L — ABNORMAL LOW (ref 98–111)
Creatinine, Ser: 2.72 mg/dL — ABNORMAL HIGH (ref 0.61–1.24)
GFR, Estimated: 23 mL/min — ABNORMAL LOW (ref >60)
Glucose, Bld: 110 mg/dL — ABNORMAL HIGH (ref 70–99)
Potassium: 3.9 mmol/L (ref 3.5–5.1)
Sodium: 136 mmol/L (ref 135–145)

## 2023-04-25 LAB — MAGNESIUM: Magnesium: 2.5 mg/dL — ABNORMAL HIGH (ref 1.7–2.4)

## 2023-04-25 NOTE — Plan of Care (Signed)
  Problem: Education: Goal: Knowledge of General Education information will improve Description: Including pain rating scale, medication(s)/side effects and non-pharmacologic comfort measures Outcome: Progressing   Problem: Clinical Measurements: Goal: Respiratory complications will improve Outcome: Progressing   Problem: Activity: Goal: Risk for activity intolerance will decrease Outcome: Progressing   

## 2023-04-25 NOTE — Care Management Important Message (Signed)
Important Message  Patient Details  Name: James Bryant MRN: [id number] Date of Birth: 01-Sep-1945   Medicare Important Message Given:  Yes     Renie Ora 04/25/2023, 9:17 AM

## 2023-04-25 NOTE — Progress Notes (Signed)
PROGRESS NOTE    James Bryant  ZOX:[id number] DOB: 1946-03-02 DOA: 04/19/2023 PCP: Jac Canavan, PA-C    Brief Narrative:   James Bryant is a 77 y.o. male with past medical history of dementia, HTN, CHF EF 20-25%, CKD IIIa baseline 1.6 and polycythemia initially presented to hospital with abnormal labs edema lethargy weight gain for 1 to 2 weeks prior to presentation.  Patient was then admitted to the hospital for CHF flare, failed outpatient management.  Assessment and Plan:  * Acute on chronic combined systolic and diastolic CHF  Continue strict intake and output charting.  Negative balance for 6396 mL so far.   Patient is edematous and has ambulatory dysfunction. On IV Lasix drip at this time.  Monitor daily weights.  2D echocardiogram from 04/24/2023 shows LV ejection fraction less than 20% with severely decreased LV function and global hypokinesis with grade 1 diastolic dysfunction. Previous 2D echocardiogram from 02/13/2023 shows EF of 20 to 25%.  As per cardiology, has advanced heart failure and does not qualify for advanced heart failure treatment due to advanced age dementia and chronic kidney disease.  Lactate is elevated at 3.4. Palliative care has been consulted for goals of care conversation  Acute hypoxia.  Improving with diuresis.  Chest x-ray with cardiomegaly and small left pleural effusion.  Required supplemental oxygen up to 6 L.  Currently has been weaned to room air.  Low lung volumes with bibasilar atelectasis.  Procalcitonin 0.13.  Blood cultures negative in less than 24 hours.  Lactate was elevated at 3.6 but afebrile.  No leukocytosis.   Acute renal failure superimposed on stage 3a chronic kidney disease  Creatinine more than 3 on admission.  Creatinine today at 2.7.  On IV Lasix drip.  Check daily BMP.Continue BMP monitoring.  Severe dementia with behavioral disturbances and agitation - Continue donepezil/Zyprexa. PT/OT urinalysis without any infection.    Primary hypertension Continue aspirin and metoprolol.  Hold ARB.  Blood pressure seems to be stable.   Polycythemia vera (HCC) - Continue hydroxyurea   Hyperkalemia ARB on hold.  On  IV Lasix.  Latest potassium of 5.1.  Initial potassium of 5.6.   Hyponatremia Improved latest sodium of 136.   Left knee effusion Without pain. Low suspicion for infection  Goals of care.  Patient with low EF with dementia.  Patient used to be in hospice in the past but currently not on hospice.  Family okay with palliative care but not hospice and want to proceed with what can be done to help him live but not to be aggressive with him.  Did have a prolonged discussion with patient's daughter on 04/24/2023.   DVT prophylaxis: Place TED hose Start: 04/23/23 1752 enoxaparin (LOVENOX) injection 30 mg Start: 04/19/23 1600   Code Status:     Code Status: Full Code  Disposition: Uncertain at this time likely home with home health in 1 to 2 days.  Pending clinical improvement.  Status is: Inpatient  Remains inpatient appropriate because: Advanced heart failure, Lasix drip, pending clinical improvement   Family Communication: Spoke with the patient's daughter at length on 04/24/2023  Consultants:  Cardiology  Procedures:  None  Antimicrobials:  None  Anti-infectives (From admission, onward)    None       Subjective: Today, patient was seen and examined at bedside.  He is more verbal today and states that he feels better.  Sitter at bedside.  Denies chest pain but has some shortness of breath.  Objective:  Vitals:   04/25/23 0400 04/25/23 0715 04/25/23 0850 04/25/23 1237  BP: 128/83 (!) 108/59 107/65 113/73  Pulse: 81 81  83  Resp: 17 18  20   Temp: 97.9 F (36.6 C) 97.7 F (36.5 C)  97.6 F (36.4 C)  TempSrc: Oral Axillary  Oral  SpO2: 97% 99%  98%  Weight: 64.4 kg     Height:        Intake/Output Summary (Last 24 hours) at 04/25/2023 1413 Last data filed at 04/25/2023 1318 Gross per  24 hour  Intake 514.06 ml  Output 4350 ml  Net -3835.94 ml   Filed Weights   04/23/23 0723 04/24/23 0448 04/25/23 0400  Weight: 71.9 kg 71.3 kg 64.4 kg    Physical Examination: Body mass index is 22.24 kg/m.   General:  Average built, not in obvious distress, mildly verbal, weak deconditioned and frail. HENT:   Distended neck vein.  Chest:    Diminished breath sounds bilaterally.  Coarse breath sounds noted CVS: S1 &S2 heard. No murmur.  Regular rate and rhythm. Abdomen: Soft, nontender, nondistended.  Bowel sounds are heard.   Extremities: No cyanosis, clubbing with bilateral lower extremity edema noted, on compression stockings Psych: Alert, awake and mildly Communicative, minimally verbal, dementia, CNS:  No cranial nerve deficits.  Moving all extremities Skin: Warm and dry.  No rashes noted.  Data Reviewed:   CBC: Recent Labs  Lab 04/19/23 1645 04/20/23 0726 04/21/23 0335 04/24/23 0907 04/25/23 0305  WBC 4.2 3.4* 3.7* 3.6* 3.0*  HGB 11.0* 11.9* 12.1* 10.5* 10.6*  HCT 33.0* 37.5* 38.1* 31.6* 32.5*  MCV 126.0* 128.9* 129.6* 127.4* 124.0*  PLT 144* 144* 167 150 146*    Basic Metabolic Panel: Recent Labs  Lab 04/20/23 0726 04/21/23 0335 04/22/23 0352 04/23/23 0508 04/24/23 0403 04/25/23 0305  NA 137 135 135 132* 136 136  K 4.6 4.4 5.0 5.6* 5.1 3.9  CL 102 97* 99 99 98 94*  CO2 20* 24 20* 21* 22 27  GLUCOSE 88 93 88 79 80 110*  BUN 59* 58* 60* 65* 69* 68*  CREATININE 2.99* 2.79* 2.59* 2.90* 2.88* 2.72*  CALCIUM 9.1 9.2 9.0 8.8* 9.0 9.1  MG 2.7*  --   --   --   --  2.5*    Liver Function Tests: Recent Labs  Lab 04/20/23 0726 04/21/23 0335  AST 37 41  ALT 21 23  ALKPHOS 87 108  BILITOT 2.9* 2.6*  PROT 7.2 7.6  ALBUMIN 3.4* 3.5     Radiology Studies: ECHOCARDIOGRAM COMPLETE  Result Date: 04/24/2023    ECHOCARDIOGRAM REPORT   Patient Name:   James Bryant [facility] Date of Exam: 04/24/2023 Medical Rec #:  [id number]        Height:       67.0 in Accession  #:    [id number]       Weight:       157.2 lb Date of Birth:  01/31/1946         BSA:          1.825 m Patient Age:    77 years         BP:           124/92 mmHg Patient Gender: M                HR:           75 bpm. Exam Location:  Inpatient Procedure: 2D Echo, Cardiac Doppler, Color Doppler and Intracardiac  Opacification Agent Indications:    CHF  History:        Patient has prior history of Echocardiogram examinations, most                 recent 02/13/2023. CHF; Risk Factors:Hypertension and Former                 Smoker.  Sonographer:    Darlys Gales Referring Phys: [id number] CHRISTOPHER P DANFORD IMPRESSIONS  1. Left ventricular ejection fraction, by estimation, is <20%. The left ventricle has severely decreased function. The left ventricle demonstrates global hypokinesis. The left ventricular internal cavity size was mildly dilated. Left ventricular diastolic parameters are consistent with Grade I diastolic dysfunction (impaired relaxation). The interventricular septum is flattened in diastole ('D' shaped left ventricle), consistent with right ventricular volume overload.  2. Right ventricular systolic function is severely reduced. The right ventricular size is severely enlarged.  3. Left atrial size was severely dilated.  4. Right atrial size was severely dilated.  5. The mitral valve is normal in structure. Mild mitral valve regurgitation.  6. Tricuspid valve regurgitation is moderate to severe.  7. The aortic valve is tricuspid. There is mild calcification of the aortic valve. Aortic valve regurgitation is mild to moderate. Aortic valve sclerosis is present, with no evidence of aortic valve stenosis. Aortic regurgitation PHT measures 325 msec.  8. Aortic dilatation noted. There is moderate dilatation of the aortic root, measuring 44 mm. Comparison(s): Prior images reviewed side by side. The right ventricular function is worse (but was not normal on the previous study either) and there is worsened  tricuspid insufficiency. FINDINGS  Left Ventricle: No left ventricular thrombus is seen (Definity contrast was used). Left ventricular ejection fraction, by estimation, is <20%. The left ventricle has severely decreased function. The left ventricle demonstrates global hypokinesis. The left ventricular internal cavity size was mildly dilated. There is no left ventricular hypertrophy. The interventricular septum is flattened in diastole ('D' shaped left ventricle), consistent with right ventricular volume overload. Left ventricular diastolic parameters are consistent with Grade I diastolic dysfunction (impaired relaxation). Indeterminate filling pressures. Right Ventricle: The right ventricular size is severely enlarged. No increase in right ventricular wall thickness. Right ventricular systolic function is severely reduced. Left Atrium: Left atrial size was severely dilated. Right Atrium: Right atrial size was severely dilated. Pericardium: There is no evidence of pericardial effusion. Mitral Valve: The mitral valve is normal in structure. Mild mitral valve regurgitation, with centrally-directed jet. Tricuspid Valve: The tricuspid valve is normal in structure. Tricuspid valve regurgitation is moderate to severe. Aortic Valve: The aortic valve is tricuspid. There is mild calcification of the aortic valve. Aortic valve regurgitation is mild to moderate. Aortic regurgitation PHT measures 325 msec. Aortic valve sclerosis is present, with no evidence of aortic valve stenosis. Aortic valve mean gradient measures 3.0 mmHg. Aortic valve peak gradient measures 4.5 mmHg. Aortic valve area, by VTI measures 1.61 cm. Pulmonic Valve: The pulmonic valve was grossly normal. Pulmonic valve regurgitation is trivial. No evidence of pulmonic stenosis. Aorta: Aortic dilatation noted. There is moderate dilatation of the aortic root, measuring 44 mm. Venous: The inferior vena cava was not well visualized. IAS/Shunts: No atrial level shunt  detected by color flow Doppler.  LEFT VENTRICLE PLAX 2D LVIDd:         5.90 cm   Diastology LVIDs:         5.50 cm   LV e' medial:    2.72 cm/s LV PW:  1.00 cm   LV E/e' medial:  15.1 LV IVS:        0.80 cm   LV e' lateral:   3.59 cm/s LVOT diam:     1.90 cm   LV E/e' lateral: 11.4 LV SV:         32 LV SV Index:   18 LVOT Area:     2.84 cm  RIGHT VENTRICLE RV S prime:     7.07 cm/s TAPSE (M-mode): 1.2 cm LEFT ATRIUM           Index        RIGHT ATRIUM           Index LA Vol (A2C): 33.0 ml 18.08 ml/m  RA Area:     28.80 cm LA Vol (A4C): 51.8 ml 28.38 ml/m  RA Volume:   107.00 ml 58.61 ml/m  AORTIC VALVE AV Area (Vmax):    1.66 cm AV Area (Vmean):   1.68 cm AV Area (VTI):     1.61 cm AV Vmax:           106.00 cm/s AV Vmean:          78.200 cm/s AV VTI:            0.199 m AV Peak Grad:      4.5 mmHg AV Mean Grad:      3.0 mmHg LVOT Vmax:         62.00 cm/s LVOT Vmean:        46.300 cm/s LVOT VTI:          0.113 m LVOT/AV VTI ratio: 0.57 AI PHT:            325 msec  AORTA Ao Root diam: 4.40 cm MITRAL VALVE               TRICUSPID VALVE MV Area (PHT): 4.78 cm    TR Peak grad:   28.9 mmHg MV E velocity: 41.00 cm/s  TR Vmax:        269.00 cm/s MV A velocity: 54.00 cm/s MV E/A ratio:  0.76        SHUNTS                            Systemic VTI:  0.11 m                            Systemic Diam: 1.90 cm Rachelle Hora Croitoru MD Electronically signed by Thurmon Fair MD Signature Date/Time: 04/24/2023/4:18:59 PM    Final    DG CHEST PORT 1 VIEW  Result Date: 04/23/2023 CLINICAL DATA:  Hypoxia. EXAM: PORTABLE CHEST 1 VIEW COMPARISON:  Chest x-ray 04/10/2023. FINDINGS: Enlarged cardiac silhouette. Small left pleural effusion. Overlying streaky left basilar opacities, likely atelectasis. No confluent consolidation. No visible pneumothorax. No evidence of acute bony abnormality. Polyarticular degenerative change. IMPRESSION: 1. Small left pleural effusion with suspected mild overlying atelectasis. 2. Cardiomegaly.  Electronically Signed   By: Feliberto Harts M.D.   On: 04/23/2023 17:09      LOS: 4 days    Joycelyn Das, MD [facility] Available via Epic secure chat 7am-7pm After these hours, please refer to coverage provider listed on [email or web address] 04/25/2023, 2:13 PM

## 2023-04-25 NOTE — Consult Note (Signed)
Consultation Note Date: 04/25/2023   Patient Name: James Bryant  DOB: 1946-04-26  MRN: [id number]  Age / Sex: 77 y.o., male  PCP: Aleen Campi Kermit Balo, PA-C Referring Physician: Joycelyn Das, MD  Reason for Consultation: Establishing goals of care  HPI/Patient Profile: 77 y.o. male  admitted on 04/19/2023 with   past medical history of dementia, HTN, CHF EF 20-25%, CKD IIIa baseline 1.6 and polycythemia initially presented to hospital with abnormal labs edema lethargy weight gain for 1 to 2 weeks prior to presentation.    Patient  admitted to the hospital for CHF flare; treatment and stabilization  Patient does not have medical decision-making capacity.  Family face ongoing treatment option decisions, advanced directive decisions and anticipatory care needs.  Clinical Assessment and Goals of Care:  This NP Lorinda Creed reviewed medical records, received report from team, assessed the patient and then spoke to  the patient's family by telephone James Bryant/daughter)      to discuss diagnosis, prognosis, GOC, EOL wishes disposition and options.   Concept of Palliative Care was introduced as specialized medical care for people and their families living with serious illness.  If focuses on providing relief from the symptoms and stress of a serious illness.  The goal is to improve quality of life for both the patient and the family. Values and goals of care important to patient and family were attempted to be elicited.  Created space and opportunity for family to explore thoughts and feelings regarding current medical situation.  Family care for this patient at home.  They are well acquainted with his dementia and associated agitation and behavioral disturbances.  Currently he has a Comptroller at the bedside  Patient is seen by [employer] outpatient palliative services.  At some point in time  patient was serviced by hospice.  Education offered on hospice benefit; philosophy and eligibility.  Education offered on palliative services both in the community [facility]) and here in the hospital with the inpatient Palliative Medicine Team.  Education offered on the natural trajectory and expectations of end-stage dementia and its progression specific to increased risk of aspiration, weight loss, immobility, incontinence and overall failure to thrive.  Education offered on patient's high risk for decompensation secondary to the fact that patient does have end-stage dementia which is compounded by heart failure/EF of 20 to 25% and chronic kidney disease.    A  discussion was had today regarding advanced directives.    Concepts specific to code status, artifical feeding and hydration, continued IV antibiotics and rehospitalization was had.     The difference between a aggressive medical intervention path  and a palliative comfort care path for this patient at this time was had.     MOST form re introduced and stressed the importance of revisiting patient's goals of care as changes in his health exhibit themselves.    Questions and concerns addressed.  Family  encouraged to call with questions or concerns.     PMT will continue to support holistically.  Patient's wife and daughter/Next of kin are main decision makers.       SUMMARY OF RECOMMENDATIONS    Family is open to all offered and available medical interventions to prolong life at this time.    Family wish patient discharged home with home health when medically stable  Code Status/Advance Care Planning: Full code Educated family to consider DNR/DNI status understanding evidenced based poor outcomes in similar hospitalized patient, as the cause of arrest is likely associated with advanced chronic illness rather than an easily reversible acute cardio-pulmonary event.    Palliative Prophylaxis:  Aspiration, Bowel  Regimen, Delirium Protocol, Frequent Pain Assessment, and Oral Care  Additional Recommendations (Limitations, Scope, Preferences): Full Scope Treatment  Psycho-social/Spiritual:  Desire for further Chaplaincy support:no Additional Recommendations: Education on Hospice  Prognosis:  Unable to determine  Discharge Planning:  Family currently    Home with Home Health      Primary Diagnoses: Present on Admission:  Severe dementia (HCC)  Primary hypertension  Polycythemia vera (HCC)  Acute on chronic combined systolic and diastolic CHF (congestive heart failure) (HCC)   I have reviewed the medical record, interviewed the patient and family, and examined the patient. The following aspects are pertinent.  Past Medical History:  Diagnosis Date   Asthma    Calculus of gallbladder without cholecystitis without obstruction 10/18/2022   CHF (congestive heart failure) (HCC)    CKD (chronic kidney disease), stage III (HCC)    Dementia (HCC)    Elevated liver function tests 01/17/2019   Fear of needles 01/17/2019   Former smoker, stopped smoking many years ago 01/17/2019   Hypertension    Polycythemia 01/17/2019   Thrombocytosis 01/17/2019   Social History   Socioeconomic History   Marital status: Married    Spouse name: Not on file   Number of children: Not on file   Years of education: Not on file   Highest education level: 12th grade  Occupational History   Not on file  Tobacco Use   Smoking status: Former   Smokeless tobacco: Never  Vaping Use   Vaping status: Never Used  Substance and Sexual Activity   Alcohol use: No    Comment: stopped many years ago    Drug use: Never   Sexual activity: Not on file  Other Topics Concern   Not on file  Social History Narrative   Married. Retired Naval architect. Stopped smoking at age 31, smoked since age 77. Stopped alcohol use at age 34. Moved here from [place], [place] recently.    Extreme fear of needles.    Social  Determinants of Health   Financial Resource Strain: Low Risk  (04/09/2023)   Overall Financial Resource Strain (CARDIA)    Difficulty of Paying Living Expenses: Not hard at all  Food Insecurity: No Food Insecurity (04/19/2023)   Hunger Vital Sign    Worried About Running Out of Food in the Last Year: Never true    Ran Out of Food in the Last Year: Never true  Transportation Needs: No Transportation Needs (04/19/2023)   PRAPARE - Administrator, Civil Service (Medical): No    Lack of Transportation (Non-Medical): No  Physical Activity: Unknown (04/09/2023)   Exercise Vital Sign    Days of Exercise per Week: 0 days    Minutes of Exercise per Session: Not on file  Stress: No Stress Concern Present (04/09/2023)   [employer] of Occupational Health - Occupational Stress Questionnaire    Feeling of Stress :  Not at all  Social Connections: Unknown (04/09/2023)   Social Connection and Isolation Panel [NHANES]    Frequency of Communication with Friends and Family: Once a week    Frequency of Social Gatherings with Friends and Family: Once a week    Attends Religious Services: Patient declined    Database administrator or Organizations: No    Attends Engineer, structural: Not on file    Marital Status: Not on file   Family History  Problem Relation Age of Onset   Heart disease Mother    Hypertension Mother    Alzheimer's disease Neg Hx    Dementia Neg Hx    Scheduled Meds:  donepezil  10 mg Oral QHS   enoxaparin (LOVENOX) injection  30 mg Subcutaneous Q24H   hydroxyurea  500 mg Oral BID   melatonin  3 mg Oral QHS   metoprolol succinate  25 mg Oral Daily   OLANZapine  5 mg Oral QHS   sodium chloride flush  3 mL Intravenous Q12H   Continuous Infusions:  furosemide (LASIX) 200 mg in dextrose 5 % 100 mL (2 mg/mL) infusion 8 mg/hr (04/25/23 0642)   PRN Meds:.acetaminophen **OR** acetaminophen, albuterol, haloperidol lactate, polyethylene glycol Medications Prior  to Admission:  Prior to Admission medications   Medication Sig Start Date End Date Taking? Authorizing Provider  albuterol (PROVENTIL) (2.5 MG/3ML) 0.083% nebulizer solution Take 3 mLs (2.5 mg total) by nebulization every 6 (six) hours as needed for wheezing or shortness of breath. 09/06/22  Yes Tysinger, Kermit Balo, PA-C  ASPIRIN LOW DOSE 81 MG tablet TAKE 1 TABLET EVERY DAY 09/29/22  Yes Tysinger, Kermit Balo, PA-C  donepezil (ARICEPT) 10 MG tablet Take 1 tablet (10 mg total) by mouth at bedtime. 12/14/22  Yes Butch Penny, NP  ferrous gluconate (FERGON) 324 MG tablet Take 1 tablet (324 mg total) by mouth daily with breakfast. 04/11/23  Yes Tysinger, Kermit Balo, PA-C  fluticasone furoate-vilanterol (BREO ELLIPTA) 200-25 MCG/ACT AEPB Inhale 1 puff into the lungs daily.   Yes [provider]  furosemide (LASIX) 80 MG tablet Take 1 tablet (80 mg total) by mouth 2 (two) times daily. 04/11/23  Yes Tysinger, Kermit Balo, PA-C  hydroxyurea (HYDREA) 500 MG capsule Take 1 capsule (500 mg total) by mouth 2 (two) times daily. May take with food to minimize GI side effects. 04/11/23  Yes Tysinger, Kermit Balo, PA-C  metoprolol succinate (TOPROL-XL) 25 MG 24 hr tablet Take 1 tablet (25 mg total) by mouth daily. 04/11/23  Yes Tysinger, Kermit Balo, PA-C  Multiple Vitamins-Minerals (MEGA MULTIVITAMIN FOR MEN PO) Take 1 tablet by mouth daily.   Yes [provider]  nystatin cream (MYCOSTATIN) Apply 1 Application topically as needed. 07/26/22  Yes [provider]  OLANZapine (ZYPREXA) 5 MG tablet Take 1 tablet (5 mg total) by mouth at bedtime. 04/11/23  Yes Tysinger, Kermit Balo, PA-C  potassium chloride (KLOR-CON) 10 MEQ tablet Take 1 tablet (10 mEq total) by mouth 2 (two) times daily. 04/11/23  Yes Tysinger, Kermit Balo, PA-C  brimonidine (ALPHAGAN) 0.2 % ophthalmic solution SMARTSIG:In Eye(s) Patient not taking: Reported on 04/19/2023 07/17/22   [provider]  valsartan (DIOVAN) 160 MG tablet TAKE 1 TABLET EVERY  DAY Patient not taking: Reported on 04/19/2023 12/19/22   Dyann Kief, PA-C   No Known Allergies Review of Systems  Unable to perform ROS: Dementia    Physical Exam Cardiovascular:     Rate and Rhythm: Normal rate.  Pulmonary:  Effort: Pulmonary effort is normal.  Musculoskeletal:     Comments: Mineralized weakness and muscle atrophy  Skin:    General: Skin is warm and dry.  Neurological:     Mental Status: He is alert.  Psychiatric:        Speech: Speech is delayed.        Cognition and Memory: Cognition is impaired.        Judgment: Judgment is impulsive.     Vital Signs: BP 107/65   Pulse 81   Temp 97.7 F (36.5 C) (Axillary)   Resp 18   Ht 5\' 7"  (1.702 m)   Wt 64.4 kg   SpO2 99%   BMI 22.24 kg/m  Pain Scale: 0-10   Pain Score: 0-No pain   SpO2: SpO2: 99 % O2 Device:SpO2: 99 % O2 Flow Rate: .O2 Flow Rate (L/min): 1 L/min  IO: Intake/output summary:  Intake/Output Summary (Last 24 hours) at 04/25/2023 1126 Last data filed at 04/25/2023 0857 Gross per 24 hour  Intake 309.58 ml  Output 4050 ml  Net -3740.42 ml    LBM: Last BM Date : 04/24/23 Baseline Weight: Weight: 68.9 kg Most recent weight: Weight: 64.4 kg     Palliative Assessment/Data:  30 %     Time;  90 minutes  Discussed with Dr. Tyson Babinski and transition of care team via secure chat  Signed by: Lorinda Creed, NP   Please contact Palliative Medicine Team phone at [phone] for questions and concerns.  For individual provider: See Loretha Stapler

## 2023-04-25 NOTE — Progress Notes (Signed)
Progress Note  Patient Name: James Bryant Date of Encounter: 04/25/2023  Primary Cardiologist: Kristeen Miss, MD   Subjective   Patient seen and examined at his bedside.  Inpatient Medications    Scheduled Meds:  donepezil  10 mg Oral QHS   enoxaparin (LOVENOX) injection  30 mg Subcutaneous Q24H   hydroxyurea  500 mg Oral BID   melatonin  3 mg Oral QHS   metoprolol succinate  25 mg Oral Daily   OLANZapine  5 mg Oral QHS   sodium chloride flush  3 mL Intravenous Q12H   Continuous Infusions:  furosemide (LASIX) 200 mg in dextrose 5 % 100 mL (2 mg/mL) infusion 8 mg/hr (04/25/23 1610)   PRN Meds: acetaminophen **OR** acetaminophen, albuterol, haloperidol lactate, polyethylene glycol   Vital Signs    Vitals:   04/25/23 0006 04/25/23 0400 04/25/23 0715 04/25/23 0850  BP: 98/63 128/83 (!) 108/59 107/65  Pulse: 78 81 81   Resp:  17 18   Temp:  97.9 F (36.6 C) 97.7 F (36.5 C)   TempSrc:  Oral Axillary   SpO2: 95% 97% 99%   Weight:  64.4 kg    Height:        Intake/Output Summary (Last 24 hours) at 04/25/2023 1109 Last data filed at 04/25/2023 0857 Gross per 24 hour  Intake 309.58 ml  Output 4050 ml  Net -3740.42 ml   Filed Weights   04/23/23 0723 04/24/23 0448 04/25/23 0400  Weight: 71.9 kg 71.3 kg 64.4 kg    Telemetry    Sinus rhythm - Personally Reviewed  ECG    None today - Personally Reviewed  Physical Exam    General: Comfortable Head: Atraumatic, normal size  Eyes: PEERLA, EOMI  Neck: Supple, normal JVD Cardiac: Normal S1, S2; RRR; no murmurs, rubs, or gallops Lungs: Clear to auscultation bilaterally Abd: Soft, nontender, no hepatomegaly  Ext: warm, no edema Musculoskeletal: No deformities, BUE and BLE strength normal and equal Skin: Warm and dry, no rashes   Neuro: Alert and oriented to person, place, time, and situation, CNII-XII grossly intact, no focal deficits  Psych: Normal mood and affect   Labs    Chemistry Recent Labs  Lab  04/20/23 0726 04/21/23 0335 04/22/23 0352 04/23/23 0508 04/24/23 0403 04/25/23 0305  NA 137 135   < > 132* 136 136  K 4.6 4.4   < > 5.6* 5.1 3.9  CL 102 97*   < > 99 98 94*  CO2 20* 24   < > 21* 22 27  GLUCOSE 88 93   < > 79 80 110*  BUN 59* 58*   < > 65* 69* 68*  CREATININE 2.99* 2.79*   < > 2.90* 2.88* 2.72*  CALCIUM 9.1 9.2   < > 8.8* 9.0 9.1  PROT 7.2 7.6  --   --   --   --   ALBUMIN 3.4* 3.5  --   --   --   --   AST 37 41  --   --   --   --   ALT 21 23  --   --   --   --   ALKPHOS 87 108  --   --   --   --   BILITOT 2.9* 2.6*  --   --   --   --   GFRNONAA 21* 22*   < > 21* 22* 23*  ANIONGAP 15 14   < > 12 16* 15   < > =  values in this interval not displayed.     Hematology Recent Labs  Lab 04/21/23 0335 04/24/23 0907 04/25/23 0305  WBC 3.7* 3.6* 3.0*  RBC 2.94* 2.48* 2.62*  HGB 12.1* 10.5* 10.6*  HCT 38.1* 31.6* 32.5*  MCV 129.6* 127.4* 124.0*  MCH 41.2* 42.3* 40.5*  MCHC 31.8 33.2 32.6  RDW 23.3* 22.6* 22.4*  PLT 167 150 146*    Cardiac EnzymesNo results for input(s): "TROPONINI" in the last 168 hours. No results for input(s): "TROPIPOC" in the last 168 hours.   BNP Recent Labs  Lab 04/19/23 1645  BNP >4,500.0*     DDimer No results for input(s): "DDIMER" in the last 168 hours.   Radiology    ECHOCARDIOGRAM COMPLETE  Result Date: 04/24/2023    ECHOCARDIOGRAM REPORT   Patient Name:   ARVIND SEAY [facility] Date of Exam: 04/24/2023 Medical Rec #:  [id number]        Height:       67.0 in Accession #:    [id number]       Weight:       157.2 lb Date of Birth:  1945-10-27         BSA:          1.825 m Patient Age:    33 years         BP:           124/92 mmHg Patient Gender: M                HR:           75 bpm. Exam Location:  Inpatient Procedure: 2D Echo, Cardiac Doppler, Color Doppler and Intracardiac            Opacification Agent Indications:    CHF  History:        Patient has prior history of Echocardiogram examinations, most                 recent 02/13/2023.  CHF; Risk Factors:Hypertension and Former                 Smoker.  Sonographer:    Darlys Gales Referring Phys: [id number] CHRISTOPHER P DANFORD IMPRESSIONS  1. Left ventricular ejection fraction, by estimation, is <20%. The left ventricle has severely decreased function. The left ventricle demonstrates global hypokinesis. The left ventricular internal cavity size was mildly dilated. Left ventricular diastolic parameters are consistent with Grade I diastolic dysfunction (impaired relaxation). The interventricular septum is flattened in diastole ('D' shaped left ventricle), consistent with right ventricular volume overload.  2. Right ventricular systolic function is severely reduced. The right ventricular size is severely enlarged.  3. Left atrial size was severely dilated.  4. Right atrial size was severely dilated.  5. The mitral valve is normal in structure. Mild mitral valve regurgitation.  6. Tricuspid valve regurgitation is moderate to severe.  7. The aortic valve is tricuspid. There is mild calcification of the aortic valve. Aortic valve regurgitation is mild to moderate. Aortic valve sclerosis is present, with no evidence of aortic valve stenosis. Aortic regurgitation PHT measures 325 msec.  8. Aortic dilatation noted. There is moderate dilatation of the aortic root, measuring 44 mm. Comparison(s): Prior images reviewed side by side. The right ventricular function is worse (but was not normal on the previous study either) and there is worsened tricuspid insufficiency. FINDINGS  Left Ventricle: No left ventricular thrombus is seen (Definity contrast was used). Left ventricular ejection fraction, by estimation, is <20%. The left  ventricle has severely decreased function. The left ventricle demonstrates global hypokinesis. The left ventricular internal cavity size was mildly dilated. There is no left ventricular hypertrophy. The interventricular septum is flattened in diastole ('D' shaped left ventricle),  consistent with right ventricular volume overload. Left ventricular diastolic parameters are consistent with Grade I diastolic dysfunction (impaired relaxation). Indeterminate filling pressures. Right Ventricle: The right ventricular size is severely enlarged. No increase in right ventricular wall thickness. Right ventricular systolic function is severely reduced. Left Atrium: Left atrial size was severely dilated. Right Atrium: Right atrial size was severely dilated. Pericardium: There is no evidence of pericardial effusion. Mitral Valve: The mitral valve is normal in structure. Mild mitral valve regurgitation, with centrally-directed jet. Tricuspid Valve: The tricuspid valve is normal in structure. Tricuspid valve regurgitation is moderate to severe. Aortic Valve: The aortic valve is tricuspid. There is mild calcification of the aortic valve. Aortic valve regurgitation is mild to moderate. Aortic regurgitation PHT measures 325 msec. Aortic valve sclerosis is present, with no evidence of aortic valve stenosis. Aortic valve mean gradient measures 3.0 mmHg. Aortic valve peak gradient measures 4.5 mmHg. Aortic valve area, by VTI measures 1.61 cm. Pulmonic Valve: The pulmonic valve was grossly normal. Pulmonic valve regurgitation is trivial. No evidence of pulmonic stenosis. Aorta: Aortic dilatation noted. There is moderate dilatation of the aortic root, measuring 44 mm. Venous: The inferior vena cava was not well visualized. IAS/Shunts: No atrial level shunt detected by color flow Doppler.  LEFT VENTRICLE PLAX 2D LVIDd:         5.90 cm   Diastology LVIDs:         5.50 cm   LV e' medial:    2.72 cm/s LV PW:         1.00 cm   LV E/e' medial:  15.1 LV IVS:        0.80 cm   LV e' lateral:   3.59 cm/s LVOT diam:     1.90 cm   LV E/e' lateral: 11.4 LV SV:         32 LV SV Index:   18 LVOT Area:     2.84 cm  RIGHT VENTRICLE RV S prime:     7.07 cm/s TAPSE (M-mode): 1.2 cm LEFT ATRIUM           Index        RIGHT ATRIUM            Index LA Vol (A2C): 33.0 ml 18.08 ml/m  RA Area:     28.80 cm LA Vol (A4C): 51.8 ml 28.38 ml/m  RA Volume:   107.00 ml 58.61 ml/m  AORTIC VALVE AV Area (Vmax):    1.66 cm AV Area (Vmean):   1.68 cm AV Area (VTI):     1.61 cm AV Vmax:           106.00 cm/s AV Vmean:          78.200 cm/s AV VTI:            0.199 m AV Peak Grad:      4.5 mmHg AV Mean Grad:      3.0 mmHg LVOT Vmax:         62.00 cm/s LVOT Vmean:        46.300 cm/s LVOT VTI:          0.113 m LVOT/AV VTI ratio: 0.57 AI PHT:            325 msec  AORTA Ao  Root diam: 4.40 cm MITRAL VALVE               TRICUSPID VALVE MV Area (PHT): 4.78 cm    TR Peak grad:   28.9 mmHg MV E velocity: 41.00 cm/s  TR Vmax:        269.00 cm/s MV A velocity: 54.00 cm/s MV E/A ratio:  0.76        SHUNTS                            Systemic VTI:  0.11 m                            Systemic Diam: 1.90 cm Rachelle Hora Croitoru MD Electronically signed by Thurmon Fair MD Signature Date/Time: 04/24/2023/4:18:59 PM    Final    DG CHEST PORT 1 VIEW  Result Date: 04/23/2023 CLINICAL DATA:  Hypoxia. EXAM: PORTABLE CHEST 1 VIEW COMPARISON:  Chest x-ray 04/10/2023. FINDINGS: Enlarged cardiac silhouette. Small left pleural effusion. Overlying streaky left basilar opacities, likely atelectasis. No confluent consolidation. No visible pneumothorax. No evidence of acute bony abnormality. Polyarticular degenerative change. IMPRESSION: 1. Small left pleural effusion with suspected mild overlying atelectasis. 2. Cardiomegaly. Electronically Signed   By: Feliberto Harts M.D.   On: 04/23/2023 17:09    Cardiac Studies   TTE 04/24/2023 IMPRESSIONS     1. Left ventricular ejection fraction, by estimation, is <20%. The left  ventricle has severely decreased function. The left ventricle demonstrates  global hypokinesis. The left ventricular internal cavity size was mildly  dilated. Left ventricular  diastolic parameters are consistent with Grade I diastolic dysfunction  (impaired  relaxation). The interventricular septum is flattened in  diastole ('D' shaped left ventricle), consistent with right ventricular  volume overload.   2. Right ventricular systolic function is severely reduced. The right  ventricular size is severely enlarged.   3. Left atrial size was severely dilated.   4. Right atrial size was severely dilated.   5. The mitral valve is normal in structure. Mild mitral valve  regurgitation.   6. Tricuspid valve regurgitation is moderate to severe.   7. The aortic valve is tricuspid. There is mild calcification of the  aortic valve. Aortic valve regurgitation is mild to moderate. Aortic valve  sclerosis is present, with no evidence of aortic valve stenosis. Aortic  regurgitation PHT measures 325 msec.   8. Aortic dilatation noted. There is moderate dilatation of the aortic  root, measuring 44 mm.   Comparison(s): Prior images reviewed side by side. The right ventricular  function is worse (but was not normal on the previous study either) and  there is worsened tricuspid insufficiency.   Patient Profile     77 y.o. male history of nonischemic cardiomyopathy, severely depressed ejection fraction admitted for acute exacerbation of heart failure.  Assessment & Plan    Acute on chronic systolic heart failure Nonischemic cardiomyopathy Hypertension Dementia Polycythemia vera   Clinically he still appears to be volume overloaded, will benefit from continuing the Lasix drips for the next 24 hours.  Total output overnight 3740 mL.  His recent echocardiogram also reviewed.  He may also benefit from palliative care evaluation as he has been seen by advanced heart failure in the past and at this point he does not qualify for any advanced therapies. Will continue to follow with you for now.  For questions or updates, please contact  [facility] Please consult [email or web address] for contact info under Cardiology/STEMI.      Signed, Makinzy Cleere, DO   04/25/2023, 11:09 AM

## 2023-04-25 NOTE — TOC Progression Note (Addendum)
Transition of Care [facility]) - Progression Note    Patient Details  Name: James Bryant MRN: [id number] Date of Birth: [birth year]  Transition [facility]) CM/SW Contact  Leone Haven, RN Phone Number: 04/25/2023, 5:11 PM  Clinical Narrative:    [employer] to deliver the w/chair to patient room after daughter calls them in the am to give them the credit card info over the phone. TOC NCM will need to call [facility] on [address] [phone] to see if they need any orders for him to resume therapy with them.   Expected Discharge Plan: Home w Home Health Services Barriers to Discharge: Continued Medical Work up  Expected Discharge Plan and Services   Discharge Planning Services: CM Consult   Living arrangements for the past 2 months: Single Family Home                 DME Arranged: Community education officer wheelchair with seat cushion DME Agency: [employer] Date DME Agency Contacted: 04/20/23 Time DME Agency Contacted: 1550 Representative spoke with at DME Agency: Ian Malkin HH Arranged: NA           Social Determinants of Health (SDOH) Interventions SDOH Screenings   Food Insecurity: No Food Insecurity (04/19/2023)  Housing: Low Risk  (04/19/2023)  Transportation Needs: No Transportation Needs (04/19/2023)  Utilities: Not At Risk (04/19/2023)  Alcohol Screen: Low Risk  (04/09/2023)  Depression (PHQ2-9): Low Risk  (04/09/2023)  Financial Resource Strain: Low Risk  (04/09/2023)  Physical Activity: Unknown (04/09/2023)  Social Connections: Unknown (04/09/2023)  Stress: No Stress Concern Present (04/09/2023)  Tobacco Use: Medium Risk (04/19/2023)    Readmission Risk Interventions     No data to display

## 2023-04-26 DIAGNOSIS — I5043 Acute on chronic combined systolic (congestive) and diastolic (congestive) heart failure: Secondary | ICD-10-CM | POA: Diagnosis not present

## 2023-04-26 DIAGNOSIS — N179 Acute kidney failure, unspecified: Secondary | ICD-10-CM | POA: Diagnosis not present

## 2023-04-26 DIAGNOSIS — D45 Polycythemia vera: Secondary | ICD-10-CM | POA: Diagnosis not present

## 2023-04-26 DIAGNOSIS — I10 Essential (primary) hypertension: Secondary | ICD-10-CM | POA: Diagnosis not present

## 2023-04-26 LAB — BASIC METABOLIC PANEL
Anion gap: 15 (ref 5–15)
BUN: 67 mg/dL — ABNORMAL HIGH (ref 8–23)
CO2: 33 mmol/L — ABNORMAL HIGH (ref 22–32)
Calcium: 9.1 mg/dL (ref 8.9–10.3)
Chloride: 87 mmol/L — ABNORMAL LOW (ref 98–111)
Creatinine, Ser: 2.63 mg/dL — ABNORMAL HIGH (ref 0.61–1.24)
GFR, Estimated: 24 mL/min — ABNORMAL LOW (ref >60)
Glucose, Bld: 86 mg/dL (ref 70–99)
Potassium: 3.6 mmol/L (ref 3.5–5.1)
Sodium: 135 mmol/L (ref 135–145)

## 2023-04-26 LAB — CBC
HCT: 31.2 % — ABNORMAL LOW (ref 39.0–52.0)
Hemoglobin: 10.3 g/dL — ABNORMAL LOW (ref 13.0–17.0)
MCH: 40.6 pg — ABNORMAL HIGH (ref 26.0–34.0)
MCHC: 33 g/dL (ref 30.0–36.0)
MCV: 122.8 fL — ABNORMAL HIGH (ref 80.0–100.0)
Platelets: 164 10*3/uL (ref 150–400)
RBC: 2.54 MIL/uL — ABNORMAL LOW (ref 4.22–5.81)
RDW: 21.6 % — ABNORMAL HIGH (ref 11.5–15.5)
WBC: 3.2 10*3/uL — ABNORMAL LOW (ref 4.0–10.5)
nRBC: 0.6 % — ABNORMAL HIGH (ref 0.0–0.2)

## 2023-04-26 LAB — MAGNESIUM: Magnesium: 2.5 mg/dL — ABNORMAL HIGH (ref 1.7–2.4)

## 2023-04-26 MED ORDER — ORAL CARE MOUTH RINSE: 15.0000 mL | OROMUCOSAL | Status: DC | PRN

## 2023-04-26 MED ORDER — POTASSIUM CHLORIDE CRYS ER 20 MEQ PO TBCR
40.0000 meq | EXTENDED_RELEASE_TABLET | Freq: Once | ORAL | Status: AC
  Administered 2023-04-26: 40 meq via ORAL
  Filled 2023-04-26: qty 2

## 2023-04-26 MED ORDER — FUROSEMIDE 10 MG/ML IJ SOLN
40.0000 mg | Freq: Two times a day (BID) | INTRAMUSCULAR | Status: DC
  Administered 2023-04-26 – 2023-04-27 (×2): 40 mg via INTRAVENOUS
  Filled 2023-04-26 (×2): qty 4

## 2023-04-26 NOTE — TOC Progression Note (Signed)
Transition of Care [facility]) - Progression Note    Patient Details  Name: James Bryant MRN: [id number] Date of Birth: [birth year]  [facility] CM/SW Contact  Lockie Pares, RN Phone Number: 04/26/2023, 2:42 PM  Clinical Narrative:    Called and left a message with [facility]. [phone]   Expected Discharge Plan: Home w Home Health Services Barriers to Discharge: Continued Medical Work up  Expected Discharge Plan and Services   Discharge Planning Services: CM Consult   Living arrangements for the past 2 months: Single Family Home                 DME Arranged: Community education officer wheelchair with seat cushion DME Agency: [employer] Date DME Agency Contacted: 04/20/23 Time DME Agency Contacted: 1550 Representative spoke with at DME Agency: Ian Malkin HH Arranged: NA           Social Determinants of Health (SDOH) Interventions SDOH Screenings   Food Insecurity: No Food Insecurity (04/19/2023)  Housing: Low Risk  (04/19/2023)  Transportation Needs: No Transportation Needs (04/19/2023)  Utilities: Not At Risk (04/19/2023)  Alcohol Screen: Low Risk  (04/09/2023)  Depression (PHQ2-9): Low Risk  (04/09/2023)  Financial Resource Strain: Low Risk  (04/09/2023)  Physical Activity: Unknown (04/09/2023)  Social Connections: Unknown (04/09/2023)  Stress: No Stress Concern Present (04/09/2023)  Tobacco Use: Medium Risk (04/19/2023)    Readmission Risk Interventions     No data to display

## 2023-04-26 NOTE — Progress Notes (Signed)
Progress Note  Patient Name: James Bryant Date of Encounter: 04/26/2023  Primary Cardiologist: Kristeen Miss, MD   Subjective   Patient seen and examined at his bedside.  Inpatient Medications    Scheduled Meds:  donepezil  10 mg Oral QHS   enoxaparin (LOVENOX) injection  30 mg Subcutaneous Q24H   hydroxyurea  500 mg Oral BID   melatonin  3 mg Oral QHS   metoprolol succinate  25 mg Oral Daily   OLANZapine  5 mg Oral QHS   sodium chloride flush  3 mL Intravenous Q12H   Continuous Infusions:  furosemide (LASIX) 200 mg in dextrose 5 % 100 mL (2 mg/mL) infusion 8 mg/hr (04/25/23 1623)   PRN Meds: acetaminophen **OR** acetaminophen, albuterol, haloperidol lactate, polyethylene glycol   Vital Signs    Vitals:   04/25/23 1600 04/25/23 1955 04/26/23 0457 04/26/23 0706  BP: (!) 107/56 (!) 108/56 125/69 117/68  Pulse: 75 (!) 25 82 80  Resp: 20 20 18 18   Temp: (!) 97.4 F (36.3 C) (!) 97.5 F (36.4 C) (!) 97.4 F (36.3 C) (!) 97.5 F (36.4 C)  TempSrc: Oral Oral Oral Oral  SpO2: 93% 93% 100% 95%  Weight:      Height:        Intake/Output Summary (Last 24 hours) at 04/26/2023 1004 Last data filed at 04/26/2023 0809 Gross per 24 hour  Intake 700 ml  Output 2800 ml  Net -2100 ml   Filed Weights   04/23/23 0723 04/24/23 0448 04/25/23 0400  Weight: 71.9 kg 71.3 kg 64.4 kg    Telemetry    Sinus rhythm - Personally Reviewed  ECG    None today - Personally Reviewed  Physical Exam    General: Comfortable Head: Atraumatic, normal size  Eyes: PEERLA, EOMI  Neck: Supple, normal JVD Cardiac: Normal S1, S2; RRR; no murmurs, rubs, or gallops Lungs: Clear to auscultation bilaterally Abd: Soft, nontender, no hepatomegaly  Ext: warm, no edema Musculoskeletal: No deformities, BUE and BLE strength normal and equal Skin: Warm and dry, no rashes   Neuro: Alert and oriented to person, place, time, and situation, CNII-XII grossly intact, no focal deficits  Psych: Normal  mood and affect   Labs    Chemistry Recent Labs  Lab 04/20/23 0726 04/21/23 0335 04/22/23 0352 04/24/23 0403 04/25/23 0305 04/26/23 0317  NA 137 135   < > 136 136 135  K 4.6 4.4   < > 5.1 3.9 3.6  CL 102 97*   < > 98 94* 87*  CO2 20* 24   < > 22 27 33*  GLUCOSE 88 93   < > 80 110* 86  BUN 59* 58*   < > 69* 68* 67*  CREATININE 2.99* 2.79*   < > 2.88* 2.72* 2.63*  CALCIUM 9.1 9.2   < > 9.0 9.1 9.1  PROT 7.2 7.6  --   --   --   --   ALBUMIN 3.4* 3.5  --   --   --   --   AST 37 41  --   --   --   --   ALT 21 23  --   --   --   --   ALKPHOS 87 108  --   --   --   --   BILITOT 2.9* 2.6*  --   --   --   --   GFRNONAA 21* 22*   < > 22* 23* 24*  ANIONGAP  15 14   < > 16* 15 15   < > = values in this interval not displayed.     Hematology Recent Labs  Lab 04/24/23 0907 04/25/23 0305 04/26/23 0317  WBC 3.6* 3.0* 3.2*  RBC 2.48* 2.62* 2.54*  HGB 10.5* 10.6* 10.3*  HCT 31.6* 32.5* 31.2*  MCV 127.4* 124.0* 122.8*  MCH 42.3* 40.5* 40.6*  MCHC 33.2 32.6 33.0  RDW 22.6* 22.4* 21.6*  PLT 150 146* 164    Cardiac EnzymesNo results for input(s): "TROPONINI" in the last 168 hours. No results for input(s): "TROPIPOC" in the last 168 hours.   BNP Recent Labs  Lab 04/19/23 1645  BNP >4,500.0*     DDimer No results for input(s): "DDIMER" in the last 168 hours.   Radiology    ECHOCARDIOGRAM COMPLETE  Result Date: 04/24/2023    ECHOCARDIOGRAM REPORT   Patient Name:   [name] [facility] Date of Exam: 04/24/2023 Medical Rec #:  [id number]        Height:       67.0 in Accession #:    [id number]       Weight:       157.2 lb Date of Birth:  1946/07/03         BSA:          1.825 m Patient Age:    71 years         BP:           124/92 mmHg Patient Gender: M                HR:           75 bpm. Exam Location:  Inpatient Procedure: 2D Echo, Cardiac Doppler, Color Doppler and Intracardiac            Opacification Agent Indications:    CHF  History:        Patient has prior history of  Echocardiogram examinations, most                 recent 02/13/2023. CHF; Risk Factors:Hypertension and Former                 Smoker.  Sonographer:    Darlys Gales Referring Phys: [id number] CHRISTOPHER P DANFORD IMPRESSIONS  1. Left ventricular ejection fraction, by estimation, is <20%. The left ventricle has severely decreased function. The left ventricle demonstrates global hypokinesis. The left ventricular internal cavity size was mildly dilated. Left ventricular diastolic parameters are consistent with Grade I diastolic dysfunction (impaired relaxation). The interventricular septum is flattened in diastole ('D' shaped left ventricle), consistent with right ventricular volume overload.  2. Right ventricular systolic function is severely reduced. The right ventricular size is severely enlarged.  3. Left atrial size was severely dilated.  4. Right atrial size was severely dilated.  5. The mitral valve is normal in structure. Mild mitral valve regurgitation.  6. Tricuspid valve regurgitation is moderate to severe.  7. The aortic valve is tricuspid. There is mild calcification of the aortic valve. Aortic valve regurgitation is mild to moderate. Aortic valve sclerosis is present, with no evidence of aortic valve stenosis. Aortic regurgitation PHT measures 325 msec.  8. Aortic dilatation noted. There is moderate dilatation of the aortic root, measuring 44 mm. Comparison(s): Prior images reviewed side by side. The right ventricular function is worse (but was not normal on the previous study either) and there is worsened tricuspid insufficiency. FINDINGS  Left Ventricle: No left ventricular thrombus is seen (  Definity contrast was used). Left ventricular ejection fraction, by estimation, is <20%. The left ventricle has severely decreased function. The left ventricle demonstrates global hypokinesis. The left ventricular internal cavity size was mildly dilated. There is no left ventricular hypertrophy. The interventricular  septum is flattened in diastole ('D' shaped left ventricle), consistent with right ventricular volume overload. Left ventricular diastolic parameters are consistent with Grade I diastolic dysfunction (impaired relaxation). Indeterminate filling pressures. Right Ventricle: The right ventricular size is severely enlarged. No increase in right ventricular wall thickness. Right ventricular systolic function is severely reduced. Left Atrium: Left atrial size was severely dilated. Right Atrium: Right atrial size was severely dilated. Pericardium: There is no evidence of pericardial effusion. Mitral Valve: The mitral valve is normal in structure. Mild mitral valve regurgitation, with centrally-directed jet. Tricuspid Valve: The tricuspid valve is normal in structure. Tricuspid valve regurgitation is moderate to severe. Aortic Valve: The aortic valve is tricuspid. There is mild calcification of the aortic valve. Aortic valve regurgitation is mild to moderate. Aortic regurgitation PHT measures 325 msec. Aortic valve sclerosis is present, with no evidence of aortic valve stenosis. Aortic valve mean gradient measures 3.0 mmHg. Aortic valve peak gradient measures 4.5 mmHg. Aortic valve area, by VTI measures 1.61 cm. Pulmonic Valve: The pulmonic valve was grossly normal. Pulmonic valve regurgitation is trivial. No evidence of pulmonic stenosis. Aorta: Aortic dilatation noted. There is moderate dilatation of the aortic root, measuring 44 mm. Venous: The inferior vena cava was not well visualized. IAS/Shunts: No atrial level shunt detected by color flow Doppler.  LEFT VENTRICLE PLAX 2D LVIDd:         5.90 cm   Diastology LVIDs:         5.50 cm   LV e' medial:    2.72 cm/s LV PW:         1.00 cm   LV E/e' medial:  15.1 LV IVS:        0.80 cm   LV e' lateral:   3.59 cm/s LVOT diam:     1.90 cm   LV E/e' lateral: 11.4 LV SV:         32 LV SV Index:   18 LVOT Area:     2.84 cm  RIGHT VENTRICLE RV S prime:     7.07 cm/s TAPSE  (M-mode): 1.2 cm LEFT ATRIUM           Index        RIGHT ATRIUM           Index LA Vol (A2C): 33.0 ml 18.08 ml/m  RA Area:     28.80 cm LA Vol (A4C): 51.8 ml 28.38 ml/m  RA Volume:   107.00 ml 58.61 ml/m  AORTIC VALVE AV Area (Vmax):    1.66 cm AV Area (Vmean):   1.68 cm AV Area (VTI):     1.61 cm AV Vmax:           106.00 cm/s AV Vmean:          78.200 cm/s AV VTI:            0.199 m AV Peak Grad:      4.5 mmHg AV Mean Grad:      3.0 mmHg LVOT Vmax:         62.00 cm/s LVOT Vmean:        46.300 cm/s LVOT VTI:          0.113 m LVOT/AV VTI ratio: 0.57 AI PHT:  325 msec  AORTA Ao Root diam: 4.40 cm MITRAL VALVE               TRICUSPID VALVE MV Area (PHT): 4.78 cm    TR Peak grad:   28.9 mmHg MV E velocity: 41.00 cm/s  TR Vmax:        269.00 cm/s MV A velocity: 54.00 cm/s MV E/A ratio:  0.76        SHUNTS                            Systemic VTI:  0.11 m                            Systemic Diam: 1.90 cm Thurmon Fair MD Electronically signed by Thurmon Fair MD Signature Date/Time: 04/24/2023/4:18:59 PM    Final     Cardiac Studies   TTE 04/24/2023 IMPRESSIONS     1. Left ventricular ejection fraction, by estimation, is <20%. The left  ventricle has severely decreased function. The left ventricle demonstrates  global hypokinesis. The left ventricular internal cavity size was mildly  dilated. Left ventricular  diastolic parameters are consistent with Grade I diastolic dysfunction  (impaired relaxation). The interventricular septum is flattened in  diastole ('D' shaped left ventricle), consistent with right ventricular  volume overload.   2. Right ventricular systolic function is severely reduced. The right  ventricular size is severely enlarged.   3. Left atrial size was severely dilated.   4. Right atrial size was severely dilated.   5. The mitral valve is normal in structure. Mild mitral valve  regurgitation.   6. Tricuspid valve regurgitation is moderate to severe.   7. The  aortic valve is tricuspid. There is mild calcification of the  aortic valve. Aortic valve regurgitation is mild to moderate. Aortic valve  sclerosis is present, with no evidence of aortic valve stenosis. Aortic  regurgitation PHT measures 325 msec.   8. Aortic dilatation noted. There is moderate dilatation of the aortic  root, measuring 44 mm.   Comparison(s): Prior images reviewed side by side. The right ventricular  function is worse (but was not normal on the previous study either) and  there is worsened tricuspid insufficiency.   Patient Profile     77 y.o. male history of nonischemic cardiomyopathy, severely depressed ejection fraction admitted for acute exacerbation of heart failure.  Assessment & Plan    Acute on chronic systolic heart failure Nonischemic cardiomyopathy Hypertension Dementia Polycythemia vera  Clinically improved, LE edema has improved, will stop the lasix gtt but will transition to intermittent Lasix 40 mg IV BID. Continue toprol   Cr is improving today 2.6 continue to monitor. Avoid nephrotoxins Agree with holding ARB  Palliative care have been cosulted.    For questions or updates, please contact [facility] Please consult [email or web address] for contact info under Cardiology/STEMI.      Signed, Bradlee Bridgers, DO  04/26/2023, 10:04 AM

## 2023-04-26 NOTE — Plan of Care (Signed)
  Problem: Clinical Measurements: Goal: Will remain free from infection Outcome: Progressing Goal: Respiratory complications will improve Outcome: Progressing   Problem: Activity: Goal: Risk for activity intolerance will decrease Outcome: Progressing   

## 2023-04-26 NOTE — Plan of Care (Signed)
  Problem: Cardiac: Goal: Ability to achieve and maintain adequate cardiopulmonary perfusion will improve Outcome: Progressing   Problem: Clinical Measurements: Goal: Respiratory complications will improve Outcome: Progressing Goal: Cardiovascular complication will be avoided Outcome: Progressing   Problem: Activity: Goal: Risk for activity intolerance will decrease Outcome: Progressing   Problem: Nutrition: Goal: Adequate nutrition will be maintained Outcome: Progressing   Problem: Safety: Goal: Ability to remain free from injury will improve Outcome: Progressing   Problem: Education: Goal: Ability to demonstrate management of disease process will improve Outcome: Not Progressing   Problem: Education: Goal: Knowledge of General Education information will improve Description: Including pain rating scale, medication(s)/side effects and non-pharmacologic comfort measures Outcome: Not Progressing   Problem: Health Behavior/Discharge Planning: Goal: Ability to manage health-related needs will improve Outcome: Not Progressing   Problem: Coping: Goal: Level of anxiety will decrease Outcome: Not Progressing

## 2023-04-26 NOTE — Progress Notes (Signed)
PT Cancellation Note  Patient Details Name: DEQUANTE HIDAY MRN: [id number] DOB: May 02, 1946   Cancelled Treatment:    Reason Eval/Treat Not Completed: Fatigue/lethargy limiting ability to participate. Arrived with pt just finishing OT. Pt nodding off and denying PT at this time. Will follow up as able.   Hilton Cork, PT, DPT Secure Chat Preferred  Rehab Office [phone]   Arturo Morton Brion Aliment 04/26/2023, 3:07 PM

## 2023-04-26 NOTE — Progress Notes (Signed)
Mobility Specialist Progress Note:    04/26/23 1052  Mobility  Activity Ambulated with assistance in hallway  Level of Assistance Minimal assist, patient does 75% or more  Assistive Device Front wheel walker  Distance Ambulated (ft) 70 ft  Activity Response Tolerated well  Mobility Referral Yes  $Mobility charge 1 Mobility  Mobility Specialist Start Time (ACUTE ONLY) 1010  Mobility Specialist Stop Time (ACUTE ONLY) 1025  Mobility Specialist Time Calculation (min) (ACUTE ONLY) 15 min   Received pt seated EOB having no complaints and agreeable to mobility. Pt was asymptomatic throughout ambulation and returned to room w/o fault. During ambulation pt needed MinA keeping steady d/t a swaying gait.  Left in recliner w/ call bell in reach and all needs met.   Thompson Grayer Mobility Specialist  Please contact vis Secure Chat or  Rehab Office [phone]

## 2023-04-26 NOTE — Progress Notes (Addendum)
PROGRESS NOTE    [name]  UJW:[id number] DOB: 12-Oct-1945 DOA: 04/19/2023 PCP: Jac Canavan, PA-C    Brief Narrative:   [name] is a 77 y.o. male with past medical history of dementia, HTN, CHF EF 20-25%, CKD IIIa baseline 1.6 and polycythemia initially presented to hospital with abnormal labs, edema, lethargy weight gain for 1 to 2 weeks prior to presentation.  Patient was then admitted to the hospital for CHF flare, failed outpatient management.  Assessment and Plan:  * Acute on chronic combined systolic and diastolic CHF  Continue strict intake and output charting.  Negative balance for 8016 mL so far.    On IV Lasix drip at this time.  Monitor daily weights.  2D echocardiogram from 04/24/2023 shows LV ejection fraction less than 20% with severely decreased LV function and global hypokinesis with grade 1 diastolic dysfunction.  Patient with advanced heart failure and does not qualify for advanced heart failure treatment due to advanced age, dementia and chronic kidney disease.  Lactate  elevated at 3.4. Palliative care on board to discuss about goals of care conversation and at this time family wishes to continue with available medical interventions to prolong his life.  Acute hypoxia without respiratory failure.  Improved with diuretic.  Was initially on supplemental oxygen now on room air.  X-ray low lung volumes with bibasilar atelectasis.  Procalcitonin 0.13.  Blood cultures negative in 3 days.  Lactate was elevated at 3.6 but afebrile.  No leukocytosis.   Acute renal failure superimposed on stage 3a chronic kidney disease  Creatinine more than 3 on admission.  Creatinine today at 2.6 from 2.7.  On IV Lasix drip.  Check daily BMP.   Hypermagnesemia. Will avoid magnesium supplements.  Monitor magnesium level in AM.  Severe dementia with behavioral disturbances and agitation - Continue donepezil/Zyprexa.  Has sitter at bedside.  Sleep overnight   Essential  hypertension Continue aspirin and metoprolol.  Hold ARB.  Blood pressure seems to be stable.   Polycythemia vera (HCC) - Continue hydroxyurea.  Latest hemoglobin of 10.3.   Hyperkalemia ARB on hold.  On  IV Lasix drip.  Latest potassium of 3.6 initial potassium of 5.6.   Hyponatremia Improved latest sodium of 135.  Will monitor with BMP.   Left knee effusion Without pain. Low suspicion for infection  Goals of care.  Patient with low EF with dementia.  Patient used to be in hospice in the past but currently not on hospice.  Palliative care on board and plan is outpatient palliative care and proceed with her current level of care.   DVT prophylaxis: Place TED hose Start: 04/23/23 1752 enoxaparin (LOVENOX) injection 30 mg Start: 04/19/23 1600   Code Status:     Code Status: Full Code  Disposition:  Home with home health in 1 to 2 days.  Pending clinical improvement  Status is: Inpatient  Remains inpatient appropriate because: Advanced heart failure, Lasix drip, pending clinical improvement   Family Communication: Spoke with the patient's daughter at length on 04/24/2023  Consultants:  Cardiology  Procedures:  None  Antimicrobials:  None  Anti-infectives (From admission, onward)    None       Subjective: Today, patient was seen and examined at bedside.  Sitter at bedside reported that he could not sleep in the nighttime patient denies any shortness of breath chest pain fever or chills.    Objective: Vitals:   04/25/23 1600 04/25/23 1955 04/26/23 0457 04/26/23 0706  BP: (!) 107/56 Marland Kitchen)  108/56 125/69 117/68  Pulse: 75 (!) 25 82 80  Resp: 20 20 18 18   Temp: (!) 97.4 F (36.3 C) (!) 97.5 F (36.4 C) (!) 97.4 F (36.3 C) (!) 97.5 F (36.4 C)  TempSrc: Oral Oral Oral Oral  SpO2: 93% 93% 100% 95%  Weight:      Height:        Intake/Output Summary (Last 24 hours) at 04/26/2023 0929 Last data filed at 04/26/2023 0809 Gross per 24 hour  Intake 700 ml  Output 2800 ml   Net -2100 ml   Filed Weights   04/23/23 0723 04/24/23 0448 04/25/23 0400  Weight: 71.9 kg 71.3 kg 64.4 kg    Physical Examination: Body mass index is 22.24 kg/m.   General:  Average built, not in obvious distress, mildly verbal, weak deconditioned and frail.  Elderly male. HENT:   Distended neck vein.  Oral mucosa moist Chest:    Diminished breath sounds bilaterally.   CVS: S1 &S2 heard.  Abdomen: Soft, nontender, nondistended.  Bowel sounds are heard.   Extremities: No cyanosis, clubbing with bilateral lower extremity pitting edema noted Psych: Alert, awake and mildly Communicative,dementia,  CNS:  No cranial nerve deficits.  Moving all extremities Skin: Warm and dry.  No rashes noted.  Data Reviewed:   CBC: Recent Labs  Lab 04/20/23 0726 04/21/23 0335 04/24/23 0907 04/25/23 0305 04/26/23 0317  WBC 3.4* 3.7* 3.6* 3.0* 3.2*  HGB 11.9* 12.1* 10.5* 10.6* 10.3*  HCT 37.5* 38.1* 31.6* 32.5* 31.2*  MCV 128.9* 129.6* 127.4* 124.0* 122.8*  PLT 144* 167 150 146* 164    Basic Metabolic Panel: Recent Labs  Lab 04/20/23 0726 04/21/23 0335 04/22/23 0352 04/23/23 0508 04/24/23 0403 04/25/23 0305 04/26/23 0317  NA 137   < > 135 132* 136 136 135  K 4.6   < > 5.0 5.6* 5.1 3.9 3.6  CL 102   < > 99 99 98 94* 87*  CO2 20*   < > 20* 21* 22 27 33*  GLUCOSE 88   < > 88 79 80 110* 86  BUN 59*   < > 60* 65* 69* 68* 67*  CREATININE 2.99*   < > 2.59* 2.90* 2.88* 2.72* 2.63*  CALCIUM 9.1   < > 9.0 8.8* 9.0 9.1 9.1  MG 2.7*  --   --   --   --  2.5* 2.5*   < > = values in this interval not displayed.    Liver Function Tests: Recent Labs  Lab 04/20/23 0726 04/21/23 0335  AST 37 41  ALT 21 23  ALKPHOS 87 108  BILITOT 2.9* 2.6*  PROT 7.2 7.6  ALBUMIN 3.4* 3.5     Radiology Studies: ECHOCARDIOGRAM COMPLETE  Result Date: 04/24/2023    ECHOCARDIOGRAM REPORT   Patient Name:   James Bryant [facility] Date of Exam: 04/24/2023 Medical Rec #:  [id number]        Height:       67.0 in  Accession #:    [id number]       Weight:       157.2 lb Date of Birth:  07-Nov-1945         BSA:          1.825 m Patient Age:    77 years         BP:           124/92 mmHg Patient Gender: M  HR:           75 bpm. Exam Location:  Inpatient Procedure: 2D Echo, Cardiac Doppler, Color Doppler and Intracardiac            Opacification Agent Indications:    CHF  History:        Patient has prior history of Echocardiogram examinations, most                 recent 02/13/2023. CHF; Risk Factors:Hypertension and Former                 Smoker.  Sonographer:    Darlys Gales Referring Phys: [id number] CHRISTOPHER P DANFORD IMPRESSIONS  1. Left ventricular ejection fraction, by estimation, is <20%. The left ventricle has severely decreased function. The left ventricle demonstrates global hypokinesis. The left ventricular internal cavity size was mildly dilated. Left ventricular diastolic parameters are consistent with Grade I diastolic dysfunction (impaired relaxation). The interventricular septum is flattened in diastole ('D' shaped left ventricle), consistent with right ventricular volume overload.  2. Right ventricular systolic function is severely reduced. The right ventricular size is severely enlarged.  3. Left atrial size was severely dilated.  4. Right atrial size was severely dilated.  5. The mitral valve is normal in structure. Mild mitral valve regurgitation.  6. Tricuspid valve regurgitation is moderate to severe.  7. The aortic valve is tricuspid. There is mild calcification of the aortic valve. Aortic valve regurgitation is mild to moderate. Aortic valve sclerosis is present, with no evidence of aortic valve stenosis. Aortic regurgitation PHT measures 325 msec.  8. Aortic dilatation noted. There is moderate dilatation of the aortic root, measuring 44 mm. Comparison(s): Prior images reviewed side by side. The right ventricular function is worse (but was not normal on the previous study either) and there is  worsened tricuspid insufficiency. FINDINGS  Left Ventricle: No left ventricular thrombus is seen (Definity contrast was used). Left ventricular ejection fraction, by estimation, is <20%. The left ventricle has severely decreased function. The left ventricle demonstrates global hypokinesis. The left ventricular internal cavity size was mildly dilated. There is no left ventricular hypertrophy. The interventricular septum is flattened in diastole ('D' shaped left ventricle), consistent with right ventricular volume overload. Left ventricular diastolic parameters are consistent with Grade I diastolic dysfunction (impaired relaxation). Indeterminate filling pressures. Right Ventricle: The right ventricular size is severely enlarged. No increase in right ventricular wall thickness. Right ventricular systolic function is severely reduced. Left Atrium: Left atrial size was severely dilated. Right Atrium: Right atrial size was severely dilated. Pericardium: There is no evidence of pericardial effusion. Mitral Valve: The mitral valve is normal in structure. Mild mitral valve regurgitation, with centrally-directed jet. Tricuspid Valve: The tricuspid valve is normal in structure. Tricuspid valve regurgitation is moderate to severe. Aortic Valve: The aortic valve is tricuspid. There is mild calcification of the aortic valve. Aortic valve regurgitation is mild to moderate. Aortic regurgitation PHT measures 325 msec. Aortic valve sclerosis is present, with no evidence of aortic valve stenosis. Aortic valve mean gradient measures 3.0 mmHg. Aortic valve peak gradient measures 4.5 mmHg. Aortic valve area, by VTI measures 1.61 cm. Pulmonic Valve: The pulmonic valve was grossly normal. Pulmonic valve regurgitation is trivial. No evidence of pulmonic stenosis. Aorta: Aortic dilatation noted. There is moderate dilatation of the aortic root, measuring 44 mm. Venous: The inferior vena cava was not well visualized. IAS/Shunts: No atrial  level shunt detected by color flow Doppler.  LEFT VENTRICLE PLAX 2D LVIDd:  5.90 cm   Diastology LVIDs:         5.50 cm   LV e' medial:    2.72 cm/s LV PW:         1.00 cm   LV E/e' medial:  15.1 LV IVS:        0.80 cm   LV e' lateral:   3.59 cm/s LVOT diam:     1.90 cm   LV E/e' lateral: 11.4 LV SV:         32 LV SV Index:   18 LVOT Area:     2.84 cm  RIGHT VENTRICLE RV S prime:     7.07 cm/s TAPSE (M-mode): 1.2 cm LEFT ATRIUM           Index        RIGHT ATRIUM           Index LA Vol (A2C): 33.0 ml 18.08 ml/m  RA Area:     28.80 cm LA Vol (A4C): 51.8 ml 28.38 ml/m  RA Volume:   107.00 ml 58.61 ml/m  AORTIC VALVE AV Area (Vmax):    1.66 cm AV Area (Vmean):   1.68 cm AV Area (VTI):     1.61 cm AV Vmax:           106.00 cm/s AV Vmean:          78.200 cm/s AV VTI:            0.199 m AV Peak Grad:      4.5 mmHg AV Mean Grad:      3.0 mmHg LVOT Vmax:         62.00 cm/s LVOT Vmean:        46.300 cm/s LVOT VTI:          0.113 m LVOT/AV VTI ratio: 0.57 AI PHT:            325 msec  AORTA Ao Root diam: 4.40 cm MITRAL VALVE               TRICUSPID VALVE MV Area (PHT): 4.78 cm    TR Peak grad:   28.9 mmHg MV E velocity: 41.00 cm/s  TR Vmax:        269.00 cm/s MV A velocity: 54.00 cm/s MV E/A ratio:  0.76        SHUNTS                            Systemic VTI:  0.11 m                            Systemic Diam: 1.90 cm Rachelle Hora Croitoru MD Electronically signed by Thurmon Fair MD Signature Date/Time: 04/24/2023/4:18:59 PM    Final       LOS: 5 days    Joycelyn Das, MD [facility] Available via Epic secure chat 7am-7pm After these hours, please refer to coverage provider listed on [email or web address] 04/26/2023, 9:29 AM

## 2023-04-26 NOTE — Progress Notes (Signed)
Occupational Therapy Treatment Patient Details Name: James Bryant MRN: [id number] DOB: 12/10/1945 Today's Date: 04/26/2023   History of present illness Pt is a 77 y/o male presenting with abnormal labs, edema and lethargy.  Work up includes acute on chronic combined systolic and diastolic CHF.  PMHx:  CHF, Dementia, HTN   OT comments  OT instructed pt in techniques for increased safety and independence with ADLs. Pt with overall low level of alertness this session limiting participation and progress toward goals. However, pt alerted when smelling/tasting minty toothpaste and kept eyes open during toothbrushing task with noted increased participation throughout this task. Pt largely requiring Max assist for toothbrushing but requiring only Contact guard assist for rinsing mouth and spitting following brushing. Pt washed/dried face with Mod assist and washed/dried hands with Max assist. Pt currently requires Mod to Max assist for UB dressing/bathing and Total assist for LB dressing/bathing with pt's functional level affected by overall low level of alertness. Pt is making minimal progress toward OT goals. OT plans to reassess pt progress and appropriateness of goals next session. Pt will benefit from continued acute skilled OT services to address deficits outlined below. Post acute discharge, pt will benefit from continued skilled OT services in the home to maximize rehab potential and for family education.       If plan is discharge home, recommend the following:  A little help with walking and/or transfers;A lot of help with bathing/dressing/bathroom;Assistance with cooking/housework;Assistance with feeding;Direct supervision/assist for medications management;Direct supervision/assist for financial management;Assist for transportation;Help with stairs or ramp for entrance;Supervision due to cognitive status (Set up and initiation cues for self feeding)   Equipment Recommendations  None recommended  by OT    Recommendations for Other Services      Precautions / Restrictions Precautions Precautions: Fall Restrictions Weight Bearing Restrictions: No       Mobility Bed Mobility Overal bed mobility: Needs Assistance             General bed mobility comments: Pt sitting up in recliner throughout session.    Transfers                   General transfer comment: Deferred this session for pt/therapist safety secondary to pt with low level of alertness throughout majority of session.     Balance Overall balance assessment: Needs assistance Sitting-balance support: Single extremity supported, Bilateral upper extremity supported, No upper extremity supported, Feet supported (with back supported) Sitting balance-Leahy Scale: Fair Sitting balance - Comments: Fair with back supported in recliner                                   ADL either performed or assessed with clinical judgement   ADL Overall ADL's : Needs assistance/impaired     Grooming: Wash/dry hands;Wash/dry face;Oral care;Moderate assistance;Maximal assistance;Cueing for sequencing;Sitting (Max cues for initiation and sustained participation in task) Grooming Details (indicate cue type and reason): Mod assist with face washing/drying. Max assist with hand washing/drying. Largely Max assist for toothbrushing but requiring only Contact guard assist for rinsing mouth and spitting following brushing. Upper Body Bathing: Maximal assistance;Sitting   Lower Body Bathing: Total assistance   Upper Body Dressing : Maximal assistance   Lower Body Dressing: Total assistance                 General ADL Comments: Pt alert with eyes open when smelling/tasting minty toothpaste and throughout remainder  of toothbrushing task with pt returning to obtunded state follow task.    Extremity/Trunk Assessment Upper Extremity Assessment Upper Extremity Assessment: Generalized weakness;Right hand  dominant;RUE deficits/detail;LUE deficits/detail RUE Coordination: decreased fine motor (worse on Left) LUE Coordination: decreased fine motor (worse on Left)   Lower Extremity Assessment Lower Extremity Assessment: Defer to PT evaluation        Vision       Perception     Praxis      Cognition Arousal: Obtunded (Largely obtuded but with brief period of alertness) Behavior During Therapy: Flat affect Overall Cognitive Status: History of cognitive impairments - at baseline                                 General Comments: Oriented to self. Largely kept eyes closed throughout session but with a brief period of alertness when smelling/tasting minty toothpaste.        Exercises      Shoulder Instructions       General Comments VSS on RA throughout session    Pertinent Vitals/ Pain       Pain Assessment Pain Assessment: Faces Faces Pain Scale: No hurt  Home Living                                          Prior Functioning/Environment              Frequency  Min 1X/week        Progress Toward Goals  OT Goals(current goals can now be found in the care plan section)  Progress towards OT goals: Not progressing toward goals - comment;OT to reassess next treatment  Acute Rehab OT Goals Patient Stated Goal: Pt unable to state  Plan      Co-evaluation                 AM-PAC OT "6 Clicks" Daily Activity     Outcome Measure   Help from another person eating meals?: A Little Help from another person taking care of personal grooming?: A Lot Help from another person toileting, which includes using toliet, bedpan, or urinal?: Total Help from another person bathing (including washing, rinsing, drying)?: A Lot Help from another person to put on and taking off regular upper body clothing?: A Lot Help from another person to put on and taking off regular lower body clothing?: A Little 6 Click Score: 13    End of Session     OT Visit Diagnosis: Other abnormalities of gait and mobility (R26.89);Muscle weakness (generalized) (M62.81);Ataxia, unspecified (R27.0);Other (comment) (decreased activity tolerance)   Activity Tolerance Patient limited by lethargy;Patient tolerated treatment well   Patient Left in chair;with call bell/phone within reach;with chair alarm set;with nursing/sitter in room;Other (comment) (with PT entering room)   Nurse Communication Mobility status;Other (comment) (Pt with low level of alertness through majority of session)        Time: 1442-1501 OT Time Calculation (min): 19 min  Charges: OT General Charges $OT Visit: 1 Visit OT Treatments $Self Care/Home Management : 8-22 mins  Benford Asch "Orson Eva., OTR/L, MA Acute Rehab [phone]   Lendon Colonel 04/26/2023, 4:20 PM

## 2023-04-27 DIAGNOSIS — D45 Polycythemia vera: Secondary | ICD-10-CM | POA: Diagnosis not present

## 2023-04-27 DIAGNOSIS — Z7189 Other specified counseling: Secondary | ICD-10-CM

## 2023-04-27 DIAGNOSIS — Z515 Encounter for palliative care: Secondary | ICD-10-CM

## 2023-04-27 DIAGNOSIS — I10 Essential (primary) hypertension: Secondary | ICD-10-CM | POA: Diagnosis not present

## 2023-04-27 DIAGNOSIS — F03C18 Unspecified dementia, severe, with other behavioral disturbance: Secondary | ICD-10-CM

## 2023-04-27 DIAGNOSIS — N179 Acute kidney failure, unspecified: Secondary | ICD-10-CM | POA: Diagnosis not present

## 2023-04-27 DIAGNOSIS — I5043 Acute on chronic combined systolic (congestive) and diastolic (congestive) heart failure: Secondary | ICD-10-CM | POA: Diagnosis not present

## 2023-04-27 LAB — BASIC METABOLIC PANEL
Anion gap: 15 (ref 5–15)
BUN: 64 mg/dL — ABNORMAL HIGH (ref 8–23)
CO2: 31 mmol/L (ref 22–32)
Calcium: 9.3 mg/dL (ref 8.9–10.3)
Chloride: 89 mmol/L — ABNORMAL LOW (ref 98–111)
Creatinine, Ser: 2.57 mg/dL — ABNORMAL HIGH (ref 0.61–1.24)
GFR, Estimated: 25 mL/min — ABNORMAL LOW (ref >60)
Glucose, Bld: 81 mg/dL (ref 70–99)
Potassium: 4.1 mmol/L (ref 3.5–5.1)
Sodium: 135 mmol/L (ref 135–145)

## 2023-04-27 LAB — CBC
HCT: 34.2 % — ABNORMAL LOW (ref 39.0–52.0)
Hemoglobin: 11.4 g/dL — ABNORMAL LOW (ref 13.0–17.0)
MCH: 40.9 pg — ABNORMAL HIGH (ref 26.0–34.0)
MCHC: 33.3 g/dL (ref 30.0–36.0)
MCV: 122.6 fL — ABNORMAL HIGH (ref 80.0–100.0)
Platelets: 180 10*3/uL (ref 150–400)
RBC: 2.79 MIL/uL — ABNORMAL LOW (ref 4.22–5.81)
RDW: 21.6 % — ABNORMAL HIGH (ref 11.5–15.5)
WBC: 3.7 10*3/uL — ABNORMAL LOW (ref 4.0–10.5)
nRBC: 1.4 % — ABNORMAL HIGH (ref 0.0–0.2)

## 2023-04-27 LAB — MAGNESIUM: Magnesium: 2.4 mg/dL (ref 1.7–2.4)

## 2023-04-27 MED ORDER — FUROSEMIDE 10 MG/ML IJ SOLN
60.0000 mg | Freq: Two times a day (BID) | INTRAMUSCULAR | Status: DC
  Administered 2023-04-27 – 2023-04-29 (×5): 60 mg via INTRAVENOUS
  Filled 2023-04-27 (×5): qty 6

## 2023-04-27 NOTE — Progress Notes (Signed)
PROGRESS NOTE    James Bryant  NWG:[id number] DOB: 08/23/1945 DOA: 04/19/2023 PCP: Jac Canavan, PA-C    Brief Narrative:   [name] is a 77 y.o. male with past medical history of dementia, HTN, CHF EF 20-25%, CKD IIIa baseline 1.6 and polycythemia initially presented to hospital with abnormal labs, edema, lethargy weight gain for 1 to 2 weeks prior to presentation.  Patient was then admitted to the hospital for CHF flare, failed outpatient management.  Assessment and Plan:  * Acute on chronic combined systolic and diastolic CHF  Continue strict intake and output charting.  Negative balance for 9436 mL so far.    Was on IV Lasix drip which was changed to IV 40 twice daily since yesterday but has decreased urinary output today so dose has been increased to 60 mg twice daily.  Still has significant peripheral edema with elevated JVD.  Monitor daily weights.  2D echocardiogram from 04/24/2023 shows LV ejection fraction less than 20% with severely decreased LV function and global hypokinesis with grade 1 diastolic dysfunction.  Patient with advanced heart failure and does not qualify for advanced heart failure treatment due to advanced age, dementia and chronic kidney disease.  Palliative care on board to discuss about goals of care conversation and at this time family wishes to continue with available medical interventions to prolong his life.  Acute hypoxia without respiratory failure.  Improved with diuretic.  Was initially on supplemental oxygen now on room air.  X-ray low lung volumes with bibasilar atelectasis.  Procalcitonin 0.13.  Blood cultures negative in 4 days.   No leukocytosis.   Acute renal failure superimposed on stage 3a chronic kidney disease  Creatinine more than 3 on admission.  Creatinine today at 2.5 from 2.6 < 2.7.  On IV Lasix. Check daily BMP.   Hypermagnesemia. Will avoid magnesium supplements.  Improved at this time.  Latest magnesium of 2.4.  Severe dementia  with behavioral disturbances and agitation - Continue donepezil/Zyprexa.  Has sitter at bedside.  Patient did not sleep overnight and currently sleeping.   Essential hypertension Continue aspirin and metoprolol.  Hold ARB.  Blood pressure seems to be stable.   Polycythemia vera (HCC) - Continue hydroxyurea.  Latest hemoglobin of 10.3.   Hyperkalemia ARB on hold.  On  IV Lasix drip.  Latest potassium of 3.6 initial potassium of 5.6.   Hyponatremia Improved latest sodium of 135.  Will monitor with BMP.   Left knee effusion Without pain. Low suspicion for infection  Goals of care.  Patient with low EF with dementia.  Patient used to be in hospice in the past but currently not on hospice.  Palliative care on board and plan is outpatient palliative care and proceed with current level of care.   DVT prophylaxis: Place TED hose Start: 04/23/23 1752 enoxaparin (LOVENOX) injection 30 mg Start: 04/19/23 1600   Code Status:     Code Status: Full Code  Disposition:  Home with home health in 1 to 2 days when okay with cardiology.Marland Kitchen  Pending clinical improvement  Status is: Inpatient  Remains inpatient appropriate because: Advanced heart failure, Lasix IV, pending clinical improvement   Family Communication: Spoke with the patient's daughter at length on 04/24/2023  Consultants:  Cardiology  Procedures:  None  Antimicrobials:  None  Anti-infectives (From admission, onward)    None       Subjective: Today, patient was seen and examined at bedside.  Sitter at bedside.  Patient denies any interval  complaints but appears to be sleepy and did not sleep well during the nighttime as per the sitter at bedside.  Denies any pain, cough, chest pain, shortness of breath  Objective: Vitals:   04/27/23 0500 04/27/23 0725 04/27/23 0750 04/27/23 1117  BP: 115/66 126/61  (!) 104/57  Pulse: 80 75  82  Resp: 20 19  18   Temp: 98.3 F (36.8 C) 98.3 F (36.8 C)  (!) 97.3 F (36.3 C)   TempSrc: Oral Oral  Oral  SpO2: 100% 100%  99%  Weight:   64.8 kg   Height:        Intake/Output Summary (Last 24 hours) at 04/27/2023 1142 Last data filed at 04/27/2023 1000 Gross per 24 hour  Intake 800 ml  Output 1650 ml  Net -850 ml   Filed Weights   04/24/23 0448 04/25/23 0400 04/27/23 0750  Weight: 71.3 kg 64.4 kg 64.8 kg    Physical Examination: Body mass index is 22.37 kg/m.   General:  Average built, not in obvious distress, mildly somnolent but Communicative when awakened, deconditioned and frail.  Elderly male. HENT:   Distended neck vein.  Oral mucosa moist Chest:    Diminished breath sounds bilaterally.   CVS: S1 &S2 heard.  Abdomen: Soft, nontender, nondistended.  Bowel sounds are heard.   Extremities: No cyanosis, clubbing with bilateral lower extremity cross pitting edema noted Psych: Somnolent but mildly Communicative,dementia,  CNS:  No cranial nerve deficits.  Moving all extremities Skin: Warm and dry.  No rashes noted.  Data Reviewed:   CBC: Recent Labs  Lab 04/21/23 0335 04/24/23 0907 04/25/23 0305 04/26/23 0317 04/27/23 0333  WBC 3.7* 3.6* 3.0* 3.2* 3.7*  HGB 12.1* 10.5* 10.6* 10.3* 11.4*  HCT 38.1* 31.6* 32.5* 31.2* 34.2*  MCV 129.6* 127.4* 124.0* 122.8* 122.6*  PLT 167 150 146* 164 180    Basic Metabolic Panel: Recent Labs  Lab 04/23/23 0508 04/24/23 0403 04/25/23 0305 04/26/23 0317 04/27/23 0333  NA 132* 136 136 135 135  K 5.6* 5.1 3.9 3.6 4.1  CL 99 98 94* 87* 89*  CO2 21* 22 27 33* 31  GLUCOSE 79 80 110* 86 81  BUN 65* 69* 68* 67* 64*  CREATININE 2.90* 2.88* 2.72* 2.63* 2.57*  CALCIUM 8.8* 9.0 9.1 9.1 9.3  MG  --   --  2.5* 2.5* 2.4    Liver Function Tests: Recent Labs  Lab 04/21/23 0335  AST 41  ALT 23  ALKPHOS 108  BILITOT 2.6*  PROT 7.6  ALBUMIN 3.5     Radiology Studies: No results found.    LOS: 6 days    Joycelyn Das, MD [facility] Available via Epic secure chat 7am-7pm After these  hours, please refer to coverage provider listed on [email or web address] 04/27/2023, 11:42 AM

## 2023-04-27 NOTE — Plan of Care (Signed)
  Problem: Cardiac: Goal: Ability to achieve and maintain adequate cardiopulmonary perfusion will improve Outcome: Progressing   Problem: Clinical Measurements: Goal: Ability to maintain clinical measurements within normal limits will improve Outcome: Progressing Goal: Will remain free from infection Outcome: Progressing Goal: Respiratory complications will improve Outcome: Progressing Goal: Cardiovascular complication will be avoided Outcome: Progressing   Problem: Activity: Goal: Risk for activity intolerance will decrease Outcome: Progressing   Problem: Safety: Goal: Ability to remain free from injury will improve Outcome: Progressing   Problem: Skin Integrity: Goal: Risk for impaired skin integrity will decrease Outcome: Progressing

## 2023-04-27 NOTE — Progress Notes (Signed)
Progress Note  Patient Name: James Bryant Date of Encounter: 04/27/2023  Primary Cardiologist: Kristeen Miss, MD   Subjective   Patient seen and examined at his bedside. Sitting in the chair when I arrived. Has sitter in the room. He offers no complaints.  Inpatient Medications    Scheduled Meds:  donepezil  10 mg Oral QHS   enoxaparin (LOVENOX) injection  30 mg Subcutaneous Q24H   furosemide  40 mg Intravenous BID   hydroxyurea  500 mg Oral BID   melatonin  3 mg Oral QHS   metoprolol succinate  25 mg Oral Daily   OLANZapine  5 mg Oral QHS   sodium chloride flush  3 mL Intravenous Q12H   Continuous Infusions:   PRN Meds: acetaminophen **OR** acetaminophen, albuterol, haloperidol lactate, mouth rinse, polyethylene glycol   Vital Signs    Vitals:   04/27/23 0013 04/27/23 0500 04/27/23 0725 04/27/23 0750  BP: (!) 100/58 115/66 126/61   Pulse: 68 80 75   Resp: 18 20 19    Temp: 97.8 F (36.6 C) 98.3 F (36.8 C) 98.3 F (36.8 C)   TempSrc: Oral Oral Oral   SpO2: 98% 100% 100%   Weight:    64.8 kg  Height:        Intake/Output Summary (Last 24 hours) at 04/27/2023 1055 Last data filed at 04/27/2023 1000 Gross per 24 hour  Intake 800 ml  Output 1650 ml  Net -850 ml   Filed Weights   04/24/23 0448 04/25/23 0400 04/27/23 0750  Weight: 71.3 kg 64.4 kg 64.8 kg    Telemetry    Sinus rhythm - Personally Reviewed  ECG    None today - Personally Reviewed  Physical Exam    General: Comfortable Head: Atraumatic, normal size  Eyes: PEERLA, EOMI  Neck: Supple, normal JVD Cardiac: Normal S1, S2; RRR; no murmurs, rubs, or gallops Lungs: Clear to auscultation bilaterally Abd: Soft, nontender, no hepatomegaly  Ext: warm, +2 LE edema Musculoskeletal: No deformities, BUE and BLE strength normal and equal Skin: Warm and dry, no rashes   Neuro: Alert and oriented to person, place, time, and situation, CNII-XII grossly intact, no focal deficits  Psych: Normal mood  and affect   Labs    Chemistry Recent Labs  Lab 04/21/23 0335 04/22/23 0352 04/25/23 0305 04/26/23 0317 04/27/23 0333  NA 135   < > 136 135 135  K 4.4   < > 3.9 3.6 4.1  CL 97*   < > 94* 87* 89*  CO2 24   < > 27 33* 31  GLUCOSE 93   < > 110* 86 81  BUN 58*   < > 68* 67* 64*  CREATININE 2.79*   < > 2.72* 2.63* 2.57*  CALCIUM 9.2   < > 9.1 9.1 9.3  PROT 7.6  --   --   --   --   ALBUMIN 3.5  --   --   --   --   AST 41  --   --   --   --   ALT 23  --   --   --   --   ALKPHOS 108  --   --   --   --   BILITOT 2.6*  --   --   --   --   GFRNONAA 22*   < > 23* 24* 25*  ANIONGAP 14   < > 15 15 15    < > = values in this  interval not displayed.     Hematology Recent Labs  Lab 04/25/23 0305 04/26/23 0317 04/27/23 0333  WBC 3.0* 3.2* 3.7*  RBC 2.62* 2.54* 2.79*  HGB 10.6* 10.3* 11.4*  HCT 32.5* 31.2* 34.2*  MCV 124.0* 122.8* 122.6*  MCH 40.5* 40.6* 40.9*  MCHC 32.6 33.0 33.3  RDW 22.4* 21.6* 21.6*  PLT 146* 164 180    Cardiac EnzymesNo results for input(s): "TROPONINI" in the last 168 hours. No results for input(s): "TROPIPOC" in the last 168 hours.   BNP No results for input(s): "BNP", "PROBNP" in the last 168 hours.    DDimer No results for input(s): "DDIMER" in the last 168 hours.   Radiology    No results found.  Cardiac Studies   TTE 04/24/2023 IMPRESSIONS     1. Left ventricular ejection fraction, by estimation, is <20%. The left  ventricle has severely decreased function. The left ventricle demonstrates  global hypokinesis. The left ventricular internal cavity size was mildly  dilated. Left ventricular  diastolic parameters are consistent with Grade I diastolic dysfunction  (impaired relaxation). The interventricular septum is flattened in  diastole ('D' shaped left ventricle), consistent with right ventricular  volume overload.   2. Right ventricular systolic function is severely reduced. The right  ventricular size is severely enlarged.   3. Left  atrial size was severely dilated.   4. Right atrial size was severely dilated.   5. The mitral valve is normal in structure. Mild mitral valve  regurgitation.   6. Tricuspid valve regurgitation is moderate to severe.   7. The aortic valve is tricuspid. There is mild calcification of the  aortic valve. Aortic valve regurgitation is mild to moderate. Aortic valve  sclerosis is present, with no evidence of aortic valve stenosis. Aortic  regurgitation PHT measures 325 msec.   8. Aortic dilatation noted. There is moderate dilatation of the aortic  root, measuring 44 mm.   Comparison(s): Prior images reviewed side by side. The right ventricular  function is worse (but was not normal on the previous study either) and  there is worsened tricuspid insufficiency.   Patient Profile     77 y.o. male history of nonischemic cardiomyopathy, severely depressed ejection fraction admitted for acute exacerbation of heart failure.  Assessment & Plan    Acute on chronic systolic heart failure Nonischemic cardiomyopathy Hypertension Dementia Polycythemia vera  Clinically improved, LE edema has improved, Lasix gtt stopped yesterday transitioned to Lasix 40 mg IV BID, but urine output lower than yesterday, will increase Lasix to 60mg  IV BID. Continue toprol   Cr is improving today 2.57 today was 2.63 yesterday. Continue to monitor. Avoid nephrotoxins Agree with holding ARB  Palliative care have been cosulted. Has seen advanced heart failure in the past, he is not a candidate for any further adva      For questions or updates, please contact [facility] Please consult [email or web address] for contact info under Cardiology/STEMI.      Signed, Thomasene Ripple, DO  04/27/2023, 10:55 AM

## 2023-04-27 NOTE — Progress Notes (Signed)
Physical Therapy Treatment Patient Details Name: James Bryant MRN: [id number] DOB: [birth year] Today's Date: 04/27/2023   History of Present Illness Pt is a 77 y/o male presenting with abnormal labs, edema and lethargy.  Work up includes acute on chronic combined systolic and diastolic CHF.  PMHx:  CHF, Dementia, HTN    PT Comments  Pt in chair upon arrival and was agreeable to PT session. Pt ambulated 80 ft with RW and MinA to assist with RW management. Pt continues to have difficulty navigating obstacles and requires verbal and tactile cues to continue ambulation. Pt fatigued after ambulation and minimal LE exercises. Pt is progressing towards goals. Acute PT to follow.    If plan is discharge home, recommend the following: A little help with walking and/or transfers;Assistance with cooking/housework;Assist for transportation;Assistance with feeding;Help with stairs or ramp for entrance;A little help with bathing/dressing/bathroom;Supervision due to cognitive status           Precautions / Restrictions Precautions Precautions: Fall Precaution Comments: sitter present Restrictions Weight Bearing Restrictions: No     Mobility  Bed Mobility Overal bed mobility:  (up in chair upon arrival, return to chair)                  Transfers Overall transfer level: Needs assistance Equipment used: Rolling walker (2 wheels) Transfers: Sit to/from Stand Sit to Stand: Min assist           General transfer comment: MinA for rise, cues for proper RW management. Impulsive with movements    Ambulation/Gait Ambulation/Gait assistance: Min assist Gait Distance (Feet): 80 Feet Assistive device: Rolling walker (2 wheels)   Gait velocity: dec     General Gait Details: MinA for RW management. Pt was steady, however, difficulty navigating multiple obstacles in room. Short steps barely clearing the ground. Impulsive with wanting to sit before close to the chair, plopped at end of  transfer. Cues for staying inside RW       Balance Overall balance assessment: Needs assistance Sitting-balance support: Feet supported, No upper extremity supported Sitting balance-Leahy Scale: Fair Sitting balance - Comments: in recliner   Standing balance support: Bilateral upper extremity supported, Reliant on assistive device for balance, During functional activity Standing balance-Leahy Scale: Poor Standing balance comment: reliant on RW for stability                            Cognition Arousal: Obtunded (brief periods of alertness) Behavior During Therapy: Flat affect Overall Cognitive Status: History of cognitive impairments - at baseline                General Comments: Oriented to self. Largely kept eyes closed throughout session, open with ambulation        Exercises General Exercises - Lower Extremity Long Arc Quad: AROM, Both, 10 reps, Seated Hip Flexion/Marching: AROM, Seated, Both, 5 reps    General Comments General comments (skin integrity, edema, etc.): VSS on RA throughout session      Pertinent Vitals/Pain Pain Assessment Pain Assessment: No/denies pain     PT Goals (current goals can now be found in the care plan section) Acute Rehab PT Goals Patient Stated Goal: home safe with needed equipment PT Goal Formulation: Patient unable to participate in goal setting Time For Goal Achievement: 05/04/23 Potential to Achieve Goals: Good Progress towards PT goals: Progressing toward goals    Frequency    Min 1X/week       AM-PAC  PT "6 Clicks" Mobility   Outcome Measure  Help needed turning from your back to your side while in a flat bed without using bedrails?: A Little Help needed moving from lying on your back to sitting on the side of a flat bed without using bedrails?: A Little Help needed moving to and from a bed to a chair (including a wheelchair)?: A Little Help needed standing up from a chair using your arms (e.g.,  wheelchair or bedside chair)?: A Little Help needed to walk in hospital room?: A Little Help needed climbing 3-5 steps with a railing? : A Lot 6 Click Score: 17    End of Session Equipment Utilized During Treatment: Gait belt Activity Tolerance: Patient limited by fatigue Patient left: in chair;with call bell/phone within reach;with nursing/sitter in room Nurse Communication: Mobility status PT Visit Diagnosis: Unsteadiness on feet (R26.81);Muscle weakness (generalized) (M62.81);Difficulty in walking, not elsewhere classified (R26.2)     Time: 8119-1478 PT Time Calculation (min) (ACUTE ONLY): 16 min  Charges:    $Gait Training: 8-22 mins PT General Charges $$ ACUTE PT VISIT: 1 Visit                     Hilton Cork, PT, DPT Secure Chat Preferred  Rehab Office [phone]    Arturo Morton Brion Aliment 04/27/2023, 12:16 PM

## 2023-04-27 NOTE — Progress Notes (Signed)
Mobility Specialist Progress Note:    04/27/23 1439  Mobility  Activity Ambulated with assistance in room  Level of Assistance Contact guard assist, steadying assist  Assistive Device Front wheel walker  Distance Ambulated (ft) 40 ft  Activity Response Tolerated well  Mobility Referral Yes  $Mobility charge 1 Mobility  Mobility Specialist Start Time (ACUTE ONLY) 1353  Mobility Specialist Stop Time (ACUTE ONLY) 1407  Mobility Specialist Time Calculation (min) (ACUTE ONLY) 14 min   Received pt in chair having agreeable to mobility. No physical assistance needed during session. Distance limited d/t fatigue, otherwise no c/o. Left in chair w/ call bell in reach and all needs met.   Thompson Grayer Mobility Specialist  Please contact vis Secure Chat or  Rehab Office [phone]

## 2023-04-28 DIAGNOSIS — Z7189 Other specified counseling: Secondary | ICD-10-CM | POA: Diagnosis not present

## 2023-04-28 DIAGNOSIS — I5043 Acute on chronic combined systolic (congestive) and diastolic (congestive) heart failure: Secondary | ICD-10-CM | POA: Diagnosis not present

## 2023-04-28 DIAGNOSIS — N179 Acute kidney failure, unspecified: Secondary | ICD-10-CM | POA: Diagnosis not present

## 2023-04-28 DIAGNOSIS — Z515 Encounter for palliative care: Secondary | ICD-10-CM | POA: Diagnosis not present

## 2023-04-28 LAB — BASIC METABOLIC PANEL
Anion gap: 18 — ABNORMAL HIGH (ref 5–15)
BUN: 62 mg/dL — ABNORMAL HIGH (ref 8–23)
CO2: 26 mmol/L (ref 22–32)
Calcium: 9 mg/dL (ref 8.9–10.3)
Chloride: 90 mmol/L — ABNORMAL LOW (ref 98–111)
Creatinine, Ser: 2.4 mg/dL — ABNORMAL HIGH (ref 0.61–1.24)
GFR, Estimated: 27 mL/min — ABNORMAL LOW (ref >60)
Glucose, Bld: 62 mg/dL — ABNORMAL LOW (ref 70–99)
Potassium: 4.5 mmol/L (ref 3.5–5.1)
Sodium: 134 mmol/L — ABNORMAL LOW (ref 135–145)

## 2023-04-28 LAB — CBC
HCT: 34 % — ABNORMAL LOW (ref 39.0–52.0)
Hemoglobin: 11 g/dL — ABNORMAL LOW (ref 13.0–17.0)
MCH: 40.3 pg — ABNORMAL HIGH (ref 26.0–34.0)
MCHC: 32.4 g/dL (ref 30.0–36.0)
MCV: 124.5 fL — ABNORMAL HIGH (ref 80.0–100.0)
Platelets: 165 10*3/uL (ref 150–400)
RBC: 2.73 MIL/uL — ABNORMAL LOW (ref 4.22–5.81)
RDW: 21.2 % — ABNORMAL HIGH (ref 11.5–15.5)
WBC: 3.2 10*3/uL — ABNORMAL LOW (ref 4.0–10.5)
nRBC: 0.6 % — ABNORMAL HIGH (ref 0.0–0.2)

## 2023-04-28 LAB — CULTURE, BLOOD (ROUTINE X 2)
Culture: NO GROWTH
Culture: NO GROWTH
Special Requests: ADEQUATE
Special Requests: ADEQUATE

## 2023-04-28 LAB — MAGNESIUM: Magnesium: 2.4 mg/dL (ref 1.7–2.4)

## 2023-04-28 NOTE — Progress Notes (Signed)
Progress Note  Patient Name: James Bryant Date of Encounter: 04/28/2023  Primary Cardiologist: Kristeen Miss, MD   Subjective   Had -2 L yesterday, -11.4 L on admission.  BP 111/61.  Creatinine mildly improved (2.7 > 2.6 > 2.6 > 2.4).  Reports breathing has improved.  Denies any chest pain.  Inpatient Medications    Scheduled Meds:  donepezil  10 mg Oral QHS   enoxaparin (LOVENOX) injection  30 mg Subcutaneous Q24H   furosemide  60 mg Intravenous BID   hydroxyurea  500 mg Oral BID   melatonin  3 mg Oral QHS   metoprolol succinate  25 mg Oral Daily   OLANZapine  5 mg Oral QHS   sodium chloride flush  3 mL Intravenous Q12H   Continuous Infusions:   PRN Meds: acetaminophen **OR** acetaminophen, albuterol, haloperidol lactate, mouth rinse, polyethylene glycol   Vital Signs    Vitals:   04/27/23 1607 04/27/23 2129 04/28/23 0500 04/28/23 0726  BP: 101/60 122/76  111/61  Pulse: 88 95  95  Resp: 18 18  18   Temp: 97.6 F (36.4 C) 97.6 F (36.4 C)  97.6 F (36.4 C)  TempSrc: Oral   Oral  SpO2: 96% 96%  95%  Weight:   56 kg   Height:        Intake/Output Summary (Last 24 hours) at 04/28/2023 0815 Last data filed at 04/28/2023 0400 Gross per 24 hour  Intake 240 ml  Output 2450 ml  Net -2210 ml   Filed Weights   04/25/23 0400 04/27/23 0750 04/28/23 0500  Weight: 64.4 kg 64.8 kg 56 kg    Telemetry    Sinus rhythm - Personally Reviewed  ECG    None today - Personally Reviewed  Physical Exam    GEN:  in no acute distress HEENT: normal Neck: +JVD Cardiac: RRR; no murmurs Respiratory: Diminished breath GI: soft, nontender MS: no deformity or atrophy Skin: warm and dry Neuro:  Alert and Oriented x 2   Labs    Chemistry Recent Labs  Lab 04/26/23 0317 04/27/23 0333 04/28/23 0313  NA 135 135 134*  K 3.6 4.1 4.5  CL 87* 89* 90*  CO2 33* 31 26  GLUCOSE 86 81 62*  BUN 67* 64* 62*  CREATININE 2.63* 2.57* 2.40*  CALCIUM 9.1 9.3 9.0  GFRNONAA 24*  25* 27*  ANIONGAP 15 15 18*     Hematology Recent Labs  Lab 04/26/23 0317 04/27/23 0333 04/28/23 0313  WBC 3.2* 3.7* 3.2*  RBC 2.54* 2.79* 2.73*  HGB 10.3* 11.4* 11.0*  HCT 31.2* 34.2* 34.0*  MCV 122.8* 122.6* 124.5*  MCH 40.6* 40.9* 40.3*  MCHC 33.0 33.3 32.4  RDW 21.6* 21.6* 21.2*  PLT 164 180 165    Cardiac EnzymesNo results for input(s): "TROPONINI" in the last 168 hours. No results for input(s): "TROPIPOC" in the last 168 hours.   BNP No results for input(s): "BNP", "PROBNP" in the last 168 hours.    DDimer No results for input(s): "DDIMER" in the last 168 hours.   Radiology    No results found.  Cardiac Studies   TTE 04/24/2023 IMPRESSIONS     1. Left ventricular ejection fraction, by estimation, is <20%. The left  ventricle has severely decreased function. The left ventricle demonstrates  global hypokinesis. The left ventricular internal cavity size was mildly  dilated. Left ventricular  diastolic parameters are consistent with Grade I diastolic dysfunction  (impaired relaxation). The interventricular septum is flattened in  diastole ('D' shaped left ventricle), consistent with right ventricular  volume overload.   2. Right ventricular systolic function is severely reduced. The right  ventricular size is severely enlarged.   3. Left atrial size was severely dilated.   4. Right atrial size was severely dilated.   5. The mitral valve is normal in structure. Mild mitral valve  regurgitation.   6. Tricuspid valve regurgitation is moderate to severe.   7. The aortic valve is tricuspid. There is mild calcification of the  aortic valve. Aortic valve regurgitation is mild to moderate. Aortic valve  sclerosis is present, with no evidence of aortic valve stenosis. Aortic  regurgitation PHT measures 325 msec.   8. Aortic dilatation noted. There is moderate dilatation of the aortic  root, measuring 44 mm.   Comparison(s): Prior images reviewed side by side. The  right ventricular  function is worse (but was not normal on the previous study either) and  there is worsened tricuspid insufficiency.   Patient Profile     77 y.o. male history of dementia, chronic combined heart failure (EF <20%), CKD stage IIIa, polycythemia admitted for acute exacerbation of heart failure.   Assessment & Plan    Acute on chronic combined heart failure: Echo this admission shows EF less than 20%, grade 1 diastolic dysfunction, severe RV dysfunction, severe RV enlargement, severe biatrial enlargement, mild to moderate AI.  Presented with significant volume overload -Initially was on Lasix drip, converted to IV Lasix 40 mg twice daily 9/5.  Increased to 60 mg twice daily 9/6.  Good diuresis yesterday, remains hypervolemic on exam, continue IV Lasix 60 mg twice daily -He has previously been seen by advanced heart failure, not a candidate for advanced therapies.  Prognosis is poor given severe heart failure and comorbidities, palliative care has been consulted  Acute hypoxic respiratory failure: Improved with diuresis, now on room air  AKI on CKD: Creatinine over 3 on admission, baseline 1.6.  Creatinine 2.4 today  Dementia: On donepezil/Zyprexa  Polycythemia: On hydroxyurea   For questions or updates, please contact [facility] Please consult [email or web address] for contact info under Cardiology/STEMI.      Signed, Little Ishikawa, MD  04/28/2023, 8:15 AM

## 2023-04-28 NOTE — Progress Notes (Signed)
Mobility Specialist Progress Note    04/28/23 1052  Mobility  Activity Ambulated with assistance in hallway  Level of Assistance Minimal assist, patient does 75% or more  Assistive Device Front wheel walker  Distance Ambulated (ft) 60 ft  Activity Response Tolerated well  Mobility Referral Yes  $Mobility charge 1 Mobility  Mobility Specialist Start Time (ACUTE ONLY) 1044  Mobility Specialist Stop Time (ACUTE ONLY) 1051  Mobility Specialist Time Calculation (min) (ACUTE ONLY) 7 min   Pt received getting up from chair w/ nursing requesting to walk. No complaints. Returned to chair with call bell in reach and chair alarm on. NT aware.   [place] Mobility Specialist  Please Neurosurgeon or Rehab Office at [phone]

## 2023-04-28 NOTE — Progress Notes (Signed)
PROGRESS NOTE    James Bryant  EXB:[id number] DOB: 15-Jan-1946 DOA: 04/19/2023 PCP: Jac Canavan, PA-C    Brief Narrative:   James Bryant is a 77 y.o. male with past medical history of dementia, HTN, CHF EF 20-25%, CKD IIIa baseline 1.6 and polycythemia initially presented to hospital with abnormal labs, edema, lethargy weight gain for 1 to 2 weeks prior to presentation.  Patient was then admitted to the hospital for CHF flare, failed outpatient management.  Assessment and Plan:  * Acute on chronic combined systolic and diastolic CHF  Continue strict intake and output charting.  Negative balance for 10272 mL so far . Was on IV Lasix drip which was changed to IV 60 mg twice daily.  Still with peripheral edema and hypovolemia.  Cardiology following. 2D echocardiogram from 04/24/2023 shows LV ejection fraction less than 20% with severely decreased LV function and global hypokinesis with grade 1 diastolic dysfunction.  Patient with advanced heart failure and does not qualify for advanced heart failure treatment due to advanced age, dementia and chronic kidney disease.  Overall poor prognosis but patient's family wishes to continue with available medical interventions to prolong his life.  Acute hypoxia without respiratory failure.  Improved with diuretic.  Now on room air   Acute renal failure superimposed on stage 3a chronic kidney disease  Creatinine more than 3 on admission.  Creatinine today at 2.4 from 2.5 <2.6 < 2.7.  Baseline creatinine around 1.6.  On IV Lasix. Check daily BMP.   Hypermagnesemia.  Improved.  Severe dementia with behavioral disturbances and agitation - Continue donepezil/Zyprexa.  Has sitter at bedside.  Appears to be sleepy this morning   Essential hypertension Continue aspirin metoprolol.  ARB on hold   Polycythemia vera (HCC) - Continue hydroxyurea.  Latest hemoglobin of 11   Hyperkalemia Improved and normalized.  ARB on hold.  On  IV Lasix .  Latest  potassium of 4.5.   Hyponatremia Mild.  Watch closely on diuretic.   Left knee effusion Without pain. Low suspicion for infection  Goals of care.  Patient with low EF with dementia.  Patient used to be in hospice in the past but currently not on hospice.  Palliative care on board and plan is outpatient palliative care and proceed with current level of care.   DVT prophylaxis: Place TED hose Start: 04/23/23 1752 enoxaparin (LOVENOX) injection 30 mg Start: 04/19/23 1600   Code Status:     Code Status: Full Code  Disposition:  Home with home health in 1 to 2 days when okay with cardiology.Marland Kitchen  Pending clinical improvement  Status is: Inpatient  Remains inpatient appropriate because: Advanced heart failure, Lasix IV, pending clinical improvement   Family Communication:  Spoke with the patient's daughter at length on 04/24/2023  Consultants:  Cardiology  Procedures:  None  Antimicrobials:  None  Anti-infectives (From admission, onward)    None       Subjective: Today, patient was seen and examined at bedside.  Sitter at bedside.  Denies any pain, dyspnea, shortness of breath, chest pain or fever.  Poor historian.     Objective: Vitals:   04/27/23 1607 04/27/23 2129 04/28/23 0500 04/28/23 0726  BP: 101/60 122/76  111/61  Pulse: 88 95  95  Resp: 18 18  18   Temp: 97.6 F (36.4 C) 97.6 F (36.4 C)  97.6 F (36.4 C)  TempSrc: Oral   Oral  SpO2: 96% 96%  95%  Weight:   56 kg  Height:        Intake/Output Summary (Last 24 hours) at 04/28/2023 0959 Last data filed at 04/28/2023 0400 Gross per 24 hour  Intake 240 ml  Output 2450 ml  Net -2210 ml   Filed Weights   04/25/23 0400 04/27/23 0750 04/28/23 0500  Weight: 64.4 kg 64.8 kg 56 kg    Physical Examination: Body mass index is 19.34 kg/m.   General:  Average built, not in obvious distress, mildly somnolent but Communicative when awakened, deconditioned and frail.  Elderly male. HENT:   Distended neck vein.   Oral mucosa moist Chest:    Diminished breath sounds bilaterally.   CVS: S1 &S2 heard.  Abdomen: Soft, nontender, nondistended.  Bowel sounds are heard.   Extremities: No cyanosis, clubbing with bilateral lower extremity  pitting edema  Psych: Mildly somnolent, Communicative,dementia,  CNS:  No cranial nerve deficits.  Moving all extremities Skin: Warm and dry.  No rashes noted.  Data Reviewed:   CBC: Recent Labs  Lab 04/24/23 0907 04/25/23 0305 04/26/23 0317 04/27/23 0333 04/28/23 0313  WBC 3.6* 3.0* 3.2* 3.7* 3.2*  HGB 10.5* 10.6* 10.3* 11.4* 11.0*  HCT 31.6* 32.5* 31.2* 34.2* 34.0*  MCV 127.4* 124.0* 122.8* 122.6* 124.5*  PLT 150 146* 164 180 165    Basic Metabolic Panel: Recent Labs  Lab 04/24/23 0403 04/25/23 0305 04/26/23 0317 04/27/23 0333 04/28/23 0313  NA 136 136 135 135 134*  K 5.1 3.9 3.6 4.1 4.5  CL 98 94* 87* 89* 90*  CO2 22 27 33* 31 26  GLUCOSE 80 110* 86 81 62*  BUN 69* 68* 67* 64* 62*  CREATININE 2.88* 2.72* 2.63* 2.57* 2.40*  CALCIUM 9.0 9.1 9.1 9.3 9.0  MG  --  2.5* 2.5* 2.4 2.4    Liver Function Tests: No results for input(s): "AST", "ALT", "ALKPHOS", "BILITOT", "PROT", "ALBUMIN" in the last 168 hours.    Radiology Studies: No results found.    LOS: 7 days    Joycelyn Das, MD [facility] Available via Epic secure chat 7am-7pm After these hours, please refer to coverage provider listed on [email or web address] 04/28/2023, 9:59 AM

## 2023-04-29 DIAGNOSIS — I5043 Acute on chronic combined systolic (congestive) and diastolic (congestive) heart failure: Secondary | ICD-10-CM | POA: Diagnosis not present

## 2023-04-29 DIAGNOSIS — N179 Acute kidney failure, unspecified: Secondary | ICD-10-CM | POA: Diagnosis not present

## 2023-04-29 LAB — BASIC METABOLIC PANEL
Anion gap: 17 — ABNORMAL HIGH (ref 5–15)
BUN: 67 mg/dL — ABNORMAL HIGH (ref 8–23)
CO2: 33 mmol/L — ABNORMAL HIGH (ref 22–32)
Calcium: 9.1 mg/dL (ref 8.9–10.3)
Chloride: 89 mmol/L — ABNORMAL LOW (ref 98–111)
Creatinine, Ser: 2.59 mg/dL — ABNORMAL HIGH (ref 0.61–1.24)
GFR, Estimated: 24 mL/min — ABNORMAL LOW (ref >60)
Glucose, Bld: 134 mg/dL — ABNORMAL HIGH (ref 70–99)
Potassium: 4.8 mmol/L (ref 3.5–5.1)
Sodium: 139 mmol/L (ref 135–145)

## 2023-04-29 NOTE — Progress Notes (Addendum)
PROGRESS NOTE    James Bryant  ZOX:[id number] DOB: [birth year] DOA: 04/19/2023 PCP: Jac Canavan, PA-C    Brief Narrative:   James Bryant is a 77 y.o. male with past medical history of dementia, HTN, CHF EF 20-25%, CKD IIIa baseline 1.6 and polycythemia initially presented to hospital with abnormal labs, edema, lethargy weight gain for 1 to 2 weeks prior to presentation.  Patient was then admitted to the hospital for CHF flare, failed outpatient management.  Assessment and Plan:  * Acute on chronic combined systolic and diastolic CHF  Continue strict intake and output charting.  Negative balance for 81191 mL so far . Was on IV Lasix drip which was changed to IV 60 mg twice daily.  Still with peripheral edema and hypovolemia.  Cardiology following. 2D echocardiogram from 04/24/2023 shows LV ejection fraction less than 20% with severely decreased LV function and global hypokinesis with grade 1 diastolic dysfunction.  Patient with advanced heart failure and does not qualify for advanced heart failure treatment due to advanced age, dementia and chronic kidney disease.  Overall poor prognosis but patient's family wishes to continue with available medical interventions to prolong his life.  Acute hypoxia without respiratory failure.  Improved with diuretic.  Now on room air   Acute renal failure superimposed on stage 3a chronic kidney disease  Creatinine more than 3 on admission.  Latest creatinine  at 2.4 from 2.5 <2.6 < 2.7.  Baseline creatinine around 1.6.  On IV Lasix. Check daily BMP.   Hypermagnesemia.  Improved.  Severe dementia with behavioral disturbances and agitation - Continue donepezil/Zyprexa.  Has sitter at bedside.  Appears to be sleepy this morning   Essential hypertension Continue aspirin metoprolol.  ARB on hold   Polycythemia vera (HCC) - Continue hydroxyurea.  Latest hemoglobin of 11   Hyperkalemia Improved and normalized.  ARB on hold.  On  IV Lasix .  Latest  potassium of 4.5.   Hyponatremia Mild.  Watch closely on diuretic.   Left knee effusion Without pain. Low suspicion for infection  Goals of care.  Patient with low EF with dementia.  Patient used to be in hospice in the past but currently not on hospice.  Palliative care on board and plan is outpatient palliative care and proceed with current level of care.   DVT prophylaxis: Place TED hose Start: 04/23/23 1752 enoxaparin (LOVENOX) injection 30 mg Start: 04/19/23 1600   Code Status:     Code Status: Full Code  Disposition:  Home with home health in 1 to 2 days when okay with cardiology.  Pending clinical improvement  Status is: Inpatient  Remains inpatient appropriate because: Advanced heart failure, Lasix IV, pending clinical improvement   Family Communication:  None today.  Spoke with the patient's daughter on 04/29/2023 and updated her about the clinical condition of the patient.  Spoke about potential disposition home in 1 to 2 days if continues to improve.  Consultants:  Cardiology  Procedures:  None  Antimicrobials:  None  Anti-infectives (From admission, onward)    None       Subjective: Today, patient was seen and examined at bedside.  Denies shortness of breath chest pain dyspnea.  Eating food at bedside.  Poor historian.  History of dementia.    Objective: Vitals:   04/28/23 1549 04/28/23 1930 04/29/23 0435 04/29/23 0436  BP: (!) 97/51 (!) 143/75 (!) 88/65 92/60  Pulse: 83 85 91   Resp: 18 17 17    Temp: 97.6 F (  36.4 C) 97.9 F (36.6 C) 97.9 F (36.6 C)   TempSrc: Axillary Oral Oral   SpO2: 95% 91% 92%   Weight:   62.7 kg   Height:        Intake/Output Summary (Last 24 hours) at 04/29/2023 0917 Last data filed at 04/29/2023 0500 Gross per 24 hour  Intake --  Output 1850 ml  Net -1850 ml   Filed Weights   04/27/23 0750 04/28/23 0500 04/29/23 0435  Weight: 64.8 kg 56 kg 62.7 kg    Physical Examination: Body mass index is 21.65 kg/m.    General:  Average built, not in obvious distress, deconditioned and frail elderly male. up in the bedside chair eating food.   HENT:   Distended neck vein.  Oral mucosa moist Chest:    Diminished breath sounds bilaterally.   CVS: S1 &S2 heard.  Abdomen: Soft, nontender, nondistended.  Bowel sounds are heard.   Extremities: No cyanosis, clubbing with bilateral lower extremity  pitting edema  Psych: Alert awake and, Communicative,dementia,  CNS:  No cranial nerve deficits.  Moves all extremities.  Sitting up in the chair. Skin: Warm and dry.  No rashes noted.  Data Reviewed:   CBC: Recent Labs  Lab 04/24/23 0907 04/25/23 0305 04/26/23 0317 04/27/23 0333 04/28/23 0313  WBC 3.6* 3.0* 3.2* 3.7* 3.2*  HGB 10.5* 10.6* 10.3* 11.4* 11.0*  HCT 31.6* 32.5* 31.2* 34.2* 34.0*  MCV 127.4* 124.0* 122.8* 122.6* 124.5*  PLT 150 146* 164 180 165    Basic Metabolic Panel: Recent Labs  Lab 04/24/23 0403 04/25/23 0305 04/26/23 0317 04/27/23 0333 04/28/23 0313  NA 136 136 135 135 134*  K 5.1 3.9 3.6 4.1 4.5  CL 98 94* 87* 89* 90*  CO2 22 27 33* 31 26  GLUCOSE 80 110* 86 81 62*  BUN 69* 68* 67* 64* 62*  CREATININE 2.88* 2.72* 2.63* 2.57* 2.40*  CALCIUM 9.0 9.1 9.1 9.3 9.0  MG  --  2.5* 2.5* 2.4 2.4    Liver Function Tests: No results for input(s): "AST", "ALT", "ALKPHOS", "BILITOT", "PROT", "ALBUMIN" in the last 168 hours.    Radiology Studies: No results found.    LOS: 8 days    Joycelyn Das, MD [facility] Available via Epic secure chat 7am-7pm After these hours, please refer to coverage provider listed on [email or web address] 04/29/2023, 9:17 AM

## 2023-04-29 NOTE — Progress Notes (Signed)
Progress Note  Patient Name: James Bryant Date of Encounter: 04/29/2023  Primary Cardiologist: Kristeen Miss, MD   Subjective   Had -1.9 L yesterday, -13.2L on admission.  BP 92/60.  Labs were not drawn this morning.  Denies any chest pain or dyspnea  Inpatient Medications    Scheduled Meds:  donepezil  10 mg Oral QHS   enoxaparin (LOVENOX) injection  30 mg Subcutaneous Q24H   furosemide  60 mg Intravenous BID   hydroxyurea  500 mg Oral BID   melatonin  3 mg Oral QHS   metoprolol succinate  25 mg Oral Daily   OLANZapine  5 mg Oral QHS   sodium chloride flush  3 mL Intravenous Q12H   Continuous Infusions:   PRN Meds: acetaminophen **OR** acetaminophen, albuterol, haloperidol lactate, mouth rinse, polyethylene glycol   Vital Signs    Vitals:   04/28/23 1549 04/28/23 1930 04/29/23 0435 04/29/23 0436  BP: (!) 97/51 (!) 143/75 (!) 88/65 92/60  Pulse: 83 85 91   Resp: 18 17 17    Temp: 97.6 F (36.4 C) 97.9 F (36.6 C) 97.9 F (36.6 C)   TempSrc: Axillary Oral Oral   SpO2: 95% 91% 92%   Weight:   62.7 kg   Height:        Intake/Output Summary (Last 24 hours) at 04/29/2023 0855 Last data filed at 04/29/2023 0500 Gross per 24 hour  Intake --  Output 1850 ml  Net -1850 ml   Filed Weights   04/27/23 0750 04/28/23 0500 04/29/23 0435  Weight: 64.8 kg 56 kg 62.7 kg    Telemetry    Sinus rhythm - Personally Reviewed  ECG    None today - Personally Reviewed  Physical Exam    GEN:  in no acute distress HEENT: normal Neck: +JVD Cardiac: RRR; no murmurs Respiratory: Diminished breath sounds GI: soft, nontender MS: no deformity or atrophy. 1+ BLE edema Skin: warm and dry Neuro:  Alert and Oriented x 2   Labs    Chemistry Recent Labs  Lab 04/26/23 0317 04/27/23 0333 04/28/23 0313  NA 135 135 134*  K 3.6 4.1 4.5  CL 87* 89* 90*  CO2 33* 31 26  GLUCOSE 86 81 62*  BUN 67* 64* 62*  CREATININE 2.63* 2.57* 2.40*  CALCIUM 9.1 9.3 9.0  GFRNONAA 24*  25* 27*  ANIONGAP 15 15 18*     Hematology Recent Labs  Lab 04/26/23 0317 04/27/23 0333 04/28/23 0313  WBC 3.2* 3.7* 3.2*  RBC 2.54* 2.79* 2.73*  HGB 10.3* 11.4* 11.0*  HCT 31.2* 34.2* 34.0*  MCV 122.8* 122.6* 124.5*  MCH 40.6* 40.9* 40.3*  MCHC 33.0 33.3 32.4  RDW 21.6* 21.6* 21.2*  PLT 164 180 165    Cardiac EnzymesNo results for input(s): "TROPONINI" in the last 168 hours. No results for input(s): "TROPIPOC" in the last 168 hours.   BNP No results for input(s): "BNP", "PROBNP" in the last 168 hours.    DDimer No results for input(s): "DDIMER" in the last 168 hours.   Radiology    No results found.  Cardiac Studies   TTE 04/24/2023 IMPRESSIONS     1. Left ventricular ejection fraction, by estimation, is <20%. The left  ventricle has severely decreased function. The left ventricle demonstrates  global hypokinesis. The left ventricular internal cavity size was mildly  dilated. Left ventricular  diastolic parameters are consistent with Grade I diastolic dysfunction  (impaired relaxation). The interventricular septum is flattened in  diastole ('D'  shaped left ventricle), consistent with right ventricular  volume overload.   2. Right ventricular systolic function is severely reduced. The right  ventricular size is severely enlarged.   3. Left atrial size was severely dilated.   4. Right atrial size was severely dilated.   5. The mitral valve is normal in structure. Mild mitral valve  regurgitation.   6. Tricuspid valve regurgitation is moderate to severe.   7. The aortic valve is tricuspid. There is mild calcification of the  aortic valve. Aortic valve regurgitation is mild to moderate. Aortic valve  sclerosis is present, with no evidence of aortic valve stenosis. Aortic  regurgitation PHT measures 325 msec.   8. Aortic dilatation noted. There is moderate dilatation of the aortic  root, measuring 44 mm.   Comparison(s): Prior images reviewed side by side. The  right ventricular  function is worse (but was not normal on the previous study either) and  there is worsened tricuspid insufficiency.   Patient Profile     77 y.o. male history of dementia, chronic combined heart failure (EF <20%), CKD stage IIIa, polycythemia admitted for acute exacerbation of heart failure.   Assessment & Plan    Acute on chronic combined heart failure: Echo this admission shows EF less than 20%, grade 1 diastolic dysfunction, severe RV dysfunction, severe RV enlargement, severe biatrial enlargement, mild to moderate AI.  Presented with significant volume overload -Initially was on Lasix drip, converted to IV Lasix 40 mg twice daily 9/5.  Increased to 60 mg twice daily 9/6.  Good diuresis yesterday, remains hypervolemic on exam, continue IV Lasix 60 mg twice daily.  Will f/u AM labs -He has previously been seen by advanced heart failure, not a candidate for advanced therapies.  Prognosis is poor given severe heart failure and comorbidities, palliative care has been consulted  Acute hypoxic respiratory failure: Improved with diuresis, now on room air  AKI on CKD: Creatinine over 3 on admission, baseline 1.6.  Creatinine 2.4 yesterday, pending today  Dementia: On donepezil/Zyprexa  Polycythemia: On hydroxyurea   For questions or updates, please contact [facility] Please consult [email or web address] for contact info under Cardiology/STEMI.      Signed, Little Ishikawa, MD  04/29/2023, 8:55 AM

## 2023-04-29 NOTE — Plan of Care (Signed)
Patient awake, alert and impulsive during first half of shift, attempting to get up and ambulate alone multiple times.  I sat in patients room and at his door for the majority of the shift. Patient prefers to sleep in recliner. Medicated as per PRN orders with noted effectiveness. Chair alarm remains enabled. Will continue to monitor closely.   Problem: Cardiac: Goal: Ability to achieve and maintain adequate cardiopulmonary perfusion will improve Outcome: Progressing   Problem: Clinical Measurements: Goal: Ability to maintain clinical measurements within normal limits will improve Outcome: Progressing   Problem: Safety: Goal: Ability to remain free from injury will improve Outcome: Progressing   Problem: Education: Goal: Ability to demonstrate management of disease process will improve Outcome: Not Progressing   Problem: Health Behavior/Discharge Planning: Goal: Ability to manage health-related needs will improve Outcome: Not Progressing

## 2023-04-29 NOTE — Plan of Care (Signed)
  Problem: Coping: Goal: Level of anxiety will decrease Outcome: Progressing   Problem: Safety: Goal: Ability to remain free from injury will improve Outcome: Not Progressing Pt. Tries to get up unassisted

## 2023-04-29 NOTE — Progress Notes (Signed)
Mobility Specialist Progress Note    04/29/23 1026  Mobility  Activity Ambulated with assistance in hallway  Level of Assistance Minimal assist, patient does 75% or more  Assistive Device Front wheel walker  Distance Ambulated (ft) 40 ft  Activity Response Tolerated well  Mobility Referral Yes  $Mobility charge 1 Mobility  Mobility Specialist Start Time (ACUTE ONLY) 1016  Mobility Specialist Stop Time (ACUTE ONLY) 1025  Mobility Specialist Time Calculation (min) (ACUTE ONLY) 9 min   Pt received in chair and agreeable. Pt c/o not feeling good. Returned to chair with call bell in reach and chair alarm on. RN aware.   [place] Mobility Specialist  Please Neurosurgeon or Rehab Office at [phone]

## 2023-04-30 DIAGNOSIS — Z7189 Other specified counseling: Secondary | ICD-10-CM | POA: Diagnosis not present

## 2023-04-30 DIAGNOSIS — N179 Acute kidney failure, unspecified: Secondary | ICD-10-CM | POA: Diagnosis not present

## 2023-04-30 DIAGNOSIS — Z515 Encounter for palliative care: Secondary | ICD-10-CM | POA: Diagnosis not present

## 2023-04-30 DIAGNOSIS — I5043 Acute on chronic combined systolic (congestive) and diastolic (congestive) heart failure: Secondary | ICD-10-CM | POA: Diagnosis not present

## 2023-04-30 LAB — BASIC METABOLIC PANEL
Anion gap: 12 (ref 5–15)
BUN: 65 mg/dL — ABNORMAL HIGH (ref 8–23)
CO2: 34 mmol/L — ABNORMAL HIGH (ref 22–32)
Calcium: 9.1 mg/dL (ref 8.9–10.3)
Chloride: 90 mmol/L — ABNORMAL LOW (ref 98–111)
Creatinine, Ser: 2.71 mg/dL — ABNORMAL HIGH (ref 0.61–1.24)
GFR, Estimated: 23 mL/min — ABNORMAL LOW (ref >60)
Glucose, Bld: 93 mg/dL (ref 70–99)
Potassium: 3.6 mmol/L (ref 3.5–5.1)
Sodium: 136 mmol/L (ref 135–145)

## 2023-04-30 NOTE — Progress Notes (Signed)
CCMD notified RN that patient had a 7 beat run of VTACH. RN rounded, patient is sleeping in chair with safety sitter in the room. Patient expressed 0/10 pain. Chair alarm is active.

## 2023-04-30 NOTE — Consult Note (Signed)
   [facility]  [facility] Inpatient Consult   04/30/2023  JAICION CAHN 01-May-1946 [id number]  [facility] [[facility]]  Accountable Care Organization [ACO] Patient:  Insurance: [employer]  Primary Care Provider:  Genia Del, [facility] is listed to provide the transition of care   Location:  [facility] Liaison met patient at bedside at [facility]. Patient resting with wife at bedside talking on the phone, BP issues, per notes.  Will follow up at a better time.   The patient was screened for  11 day LOS admission hospitalization with noted medium risk score for unplanned readmission risk.  The patient was assessed for potential [facility]) Care Management service needs for post hospital transition for care coordination. Review of patient's electronic medical record reveals patient is with ongoing medical needs. Reviewed for progress and potential disposition.  Currently, being recommended for home health services for post hospital follow up.   Plan: [facility] Liaison will continue to follow progress and disposition to asess for post hospital community care coordination/management needs.  Referral request for community care coordination: anticipate [facility] Transitions of Care Team follow up.   [facility] Care Management/Population Health does not replace or interfere with any arrangements made by the Inpatient Transition of Care team.   For questions contact:   Charlesetta Shanks, RN, BSN, CCM [place]  [facility], [facility] Liaison Direct Dial: [phone] or secure chat Website: [email or web address]

## 2023-04-30 NOTE — Progress Notes (Signed)
PROGRESS NOTE    James Bryant  ZOX:[id number] DOB: 1946/05/16 DOA: 04/19/2023 PCP: Jac Canavan, PA-C    Brief Narrative:   James Bryant is a 77 y.o. male with past medical history of dementia, HTN, CHF EF 20-25%, CKD IIIa baseline 1.6 and polycythemia initially presented to hospital with abnormal labs, edema, lethargy weight gain for 1 to 2 weeks prior to presentation.  Patient was then admitted to the hospital for CHF flare, failed outpatient management.  Assessment and Plan:  * Acute on chronic combined systolic and diastolic CHF  Continue strict intake and output charting.  Negative balance for 81191 mL so far . Was on IV Lasix drip which was changed to IV 60 mg twice daily but now with poor output, cool extremities, hypotension, ongoing with peripheral edema and hypervolemia.  Cardiology following. 2D echocardiogram from 04/24/2023 shows LV ejection fraction less than 20% with severely decreased LV function and global hypokinesis with grade 1 diastolic dysfunction.  Overall poor prognosis. Cardiology planning to discuss with the family regarding further care plan.  Will hold off with beta-blocker and Lasix  Acute hypoxia without respiratory failure.  Improved with diuretic.  Now on room air   Acute renal failure superimposed on stage 3a chronic kidney disease  Creatinine more than 3 on admission.  Latest creatinine  at 2.7 from 2.4<  2.5 <2.6 < 2.7.  Baseline creatinine around 1.6.  On IV Lasix.  Will hold for now.  Check daily BMP.   Hypermagnesemia.  Improved.  Latest magnesium of 2.4.  Severe dementia with behavioral disturbances and agitation - Continue donepezil/Zyprexa.  Has sitter at bedside.     Essential hypertension On aspirin metoprolol.  ARB on hold.  Will hold off with metoprolol and Lasix today due to low blood pressure   Polycythemia vera (HCC) - Continue hydroxyurea.  Latest hemoglobin of 11   Hyperkalemia Improved.  Check BMP in AM.   Hyponatremia Mild.   Latest sodium of 134.  Check BMP in AM.   Left knee effusion Without pain. Low suspicion for infection  Goals of care.  Patient with low EF with dementia.  Patient used to be in hospice in the past but currently not on hospice.  Family wishes to proceed with current level of care but patient not improving.  Cardiology to discuss further with family today.  Poor prognosis.   DVT prophylaxis: Place TED hose Start: 04/23/23 1752 enoxaparin (LOVENOX) injection 30 mg Start: 04/19/23 1600   Code Status:     Code Status: Full Code  Disposition: Uncertain at this time, pending clinical improvement.  Poor prognosis.  Status is: Inpatient  Remains inpatient appropriate because: Advanced heart failure, pending clinical improvement   Family Communication:  Spoke with the patient's daughter at 04/29/23  Consultants:  Cardiology  Procedures:  None  Antimicrobials:  None  Anti-infectives (From admission, onward)    None       Subjective: Today, patient was seen and examined at bedside.  Sitting up at bedside.  Nursing staff reported that patient had been having low urinary output with low blood pressure and episodes of SVT.   Objective: Vitals:   04/30/23 1200 04/30/23 1205 04/30/23 1242 04/30/23 1300  BP: (!) 80/42 90/78 127/67 136/67  Pulse:  89  81  Resp:  18  18  Temp:      TempSrc:      SpO2:  94%    Weight:      Height:  Intake/Output Summary (Last 24 hours) at 04/30/2023 1339 Last data filed at 04/30/2023 1300 Gross per 24 hour  Intake 720 ml  Output 951 ml  Net -231 ml   Filed Weights   04/28/23 0500 04/29/23 0435 04/30/23 0402  Weight: 56 kg 62.7 kg 62.2 kg    Physical Examination: Body mass index is 21.48 kg/m.   General:  Average built, not in obvious distress, deconditioned and frail elderly male.  Sitting up up in the bedside chair eating food.   HENT:   Distended neck vein.  Oral mucosa moist Chest:    Diminished breath sounds bilaterally.    CVS: S1 &S2 heard.  Tachycardic  abdomen: Soft, nontender, nondistended.  Bowel sounds are heard.   Extremities: No cyanosis, clubbing with bilateral lower extremity  pitting edema  Psych: Alert awake and, Communicative,dementia,  CNS:  No cranial nerve deficits.  Moves all extremities.  Sitting up in the chair. Skin: Warm and dry.  No rashes noted.  Data Reviewed:   CBC: Recent Labs  Lab 04/24/23 0907 04/25/23 0305 04/26/23 0317 04/27/23 0333 04/28/23 0313  WBC 3.6* 3.0* 3.2* 3.7* 3.2*  HGB 10.5* 10.6* 10.3* 11.4* 11.0*  HCT 31.6* 32.5* 31.2* 34.2* 34.0*  MCV 127.4* 124.0* 122.8* 122.6* 124.5*  PLT 150 146* 164 180 165    Basic Metabolic Panel: Recent Labs  Lab 04/25/23 0305 04/26/23 0317 04/27/23 0333 04/28/23 0313 04/29/23 0948 04/30/23 0257  NA 136 135 135 134* 139 136  K 3.9 3.6 4.1 4.5 4.8 3.6  CL 94* 87* 89* 90* 89* 90*  CO2 27 33* 31 26 33* 34*  GLUCOSE 110* 86 81 62* 134* 93  BUN 68* 67* 64* 62* 67* 65*  CREATININE 2.72* 2.63* 2.57* 2.40* 2.59* 2.71*  CALCIUM 9.1 9.1 9.3 9.0 9.1 9.1  MG 2.5* 2.5* 2.4 2.4  --   --     Liver Function Tests: No results for input(s): "AST", "ALT", "ALKPHOS", "BILITOT", "PROT", "ALBUMIN" in the last 168 hours.    Radiology Studies: No results found.    LOS: 9 days    Joycelyn Das, MD [facility] Available via Epic secure chat 7am-7pm After these hours, please refer to coverage provider listed on [email or web address] 04/30/2023, 1:39 PM

## 2023-04-30 NOTE — Progress Notes (Signed)
Mobility Specialist Progress Note:    04/30/23 1018  Mobility  Activity Ambulated with assistance in hallway  Level of Assistance Minimal assist, patient does 75% or more  Assistive Device Front wheel walker  Distance Ambulated (ft) 20 ft  Activity Response Tolerated well  Mobility Referral Yes  $Mobility charge 1 Mobility  Mobility Specialist Start Time (ACUTE ONLY) 0932  Mobility Specialist Stop Time (ACUTE ONLY) 0945  Mobility Specialist Time Calculation (min) (ACUTE ONLY) 13 min   Received pt in chair having no complaints and agreeable to mobility. Pt was asymptomatic throughout ambulation, no dizziness nor lightheadedness. Left in chair w/ call bell in reach and all needs met. RN notified of drop in BP.    Pre Mobility- 105/50 Post Mobility- 78/42  [place] Mobility Specialist  Please contact vis Secure Chat or  Rehab Office [phone]

## 2023-04-30 NOTE — Progress Notes (Signed)
   04/30/23 0900  Vitals  BP (!) 83/66  MAP (mmHg) 71  Pulse Rate 88  ECG Heart Rate 88  MEWS COLOR  MEWS Score Color Green  Oxygen Therapy  SpO2 96 %  MEWS Score  MEWS Temp 0  MEWS Systolic 1  MEWS Pulse 0  MEWS RR 0  MEWS LOC 0  MEWS Score 1   Patient is sitting in chair with safety sitter, asymptomatic. MD notified of soft BP. RN set BP to be obtain q30 mins.

## 2023-04-30 NOTE — Progress Notes (Addendum)
Patient Name: James Bryant Date of Encounter: 04/30/2023 [facility] Cardiologist: Kristeen Miss, MD   Interval Summary  .    Appears ill.  Complaining of worsening shortness of breath.  Did not urinate today.  Has had more frequent bouts of SVT.  Blood pressure has been persistently low with narrow pulse pressures.  BP as low as 70/50 however seems to be slightly improved in the 90s systolic.  Vital Signs .    Vitals:   04/30/23 0930 04/30/23 1100 04/30/23 1130 04/30/23 1200  BP: (!) 109/50 (!) 94/46 (!) 88/50 (!) 80/42  Pulse:      Resp:  18    Temp:  (!) 97.5 F (36.4 C)    TempSrc:  Axillary    SpO2:  96% 100%   Weight:      Height:        Intake/Output Summary (Last 24 hours) at 04/30/2023 1207 Last data filed at 04/30/2023 0900 Gross per 24 hour  Intake 480 ml  Output 951 ml  Net -471 ml      04/30/2023    4:02 AM 04/29/2023    4:35 AM 04/28/2023    5:00 AM  Last 3 Weights  Weight (lbs) 137 lb 2 oz 138 lb 3.7 oz 123 lb 7.3 oz  Weight (kg) 62.2 kg 62.7 kg 56 kg      Telemetry/ECG    Normal sinus rhythm with frequent episodes of SVT lasting for several minutes.  Did have occasional bout of nonsustained VT with roughly 10 beats.- Personally Reviewed  CV Studies    Echo 04/24/2023 1. Left ventricular ejection fraction, by estimation, is <20%. The left  ventricle has severely decreased function. The left ventricle demonstrates  global hypokinesis. The left ventricular internal cavity size was mildly  dilated. Left ventricular  diastolic parameters are consistent with Grade I diastolic dysfunction  (impaired relaxation). The interventricular septum is flattened in  diastole ('D' shaped left ventricle), consistent with right ventricular  volume overload.   2. Right ventricular systolic function is severely reduced. The right  ventricular size is severely enlarged.   3. Left atrial size was severely dilated.   4. Right atrial size was severely dilated.    5. The mitral valve is normal in structure. Mild mitral valve  regurgitation.   6. Tricuspid valve regurgitation is moderate to severe.   7. The aortic valve is tricuspid. There is mild calcification of the  aortic valve. Aortic valve regurgitation is mild to moderate. Aortic valve  sclerosis is present, with no evidence of aortic valve stenosis. Aortic  regurgitation PHT measures 325 msec.   8. Aortic dilatation noted. There is moderate dilatation of the aortic  root, measuring 44 mm.   Comparison(s): Prior images reviewed side by side. The right ventricular  function is worse (but was not normal on the previous study either) and  there is worsened tricuspid insufficiency.    Physical Exam .   GEN: Critically ill Neck: + JVD Cardiac: RRR, no murmurs, rubs, or gallops.  Respiratory: poor respiratory effort with rhonchi GI: Soft, nontender, non-distended  MS: 1+  edema, extremities feel cool  Patient Profile    James Bryant is a 77 y.o. male has hx of chronic combined HFrEF, dementia, polycythemia, CKD and admitted on 04/19/2019 for for the evaluation of heart failure exacerbation   Assessment & Plan .     Acute on chronic HFrEF BNP over 4500 on admission.  Echocardiogram shows EF less than  20% with global hypokinesis.  He had interventricular septum flattening.  Severely reduced RV function with severely enlarged RV size.  Severely dilated RA and LA.  Moderate to severe TR.  Mild to moderate AR.  Patient has been severely hypervolemic initially was on Lasix drip, then converted to IV Lasix 40 mg twice daily on 9/5.  This was increased to 60 mg twice daily started on 9/6.   There is significant concerns that patient is in low output heart failure state.  He has clear signs of volume overload noted on physical exam and has had decreased urinary output and narrow pulse pressures. BP has been down trending today and been around 70/50, 90/50.  He feels cool to the touch.  Given that  patient was previously palliative care however still has full CODE STATUS, will await further direction with conversation with family to discuss goals of care.  Depending on discussion may need to involve advanced heart failure versus critical care. Stopping beta-blocker and lasix for now.   Acute hypoxic respiratory failure Maintaining room air currently.  This was improved after diuretics.  SVT/VT On telemetry today has multiple bouts of SVT with heart rates in the 120s.  These have been occurring more frequently.  He did have short nonsustained VT for approximately 10 beats.  Could be related to excess volume.  AKI on CKD On admission creatinine 3.15.  Today's 2.71.  Initially renal function was improving however started to climb again.  Dementia On donepezil/Zyprexa. Has needed haloperidol for agitation   Polycythemia vera On hydroxyurea  Aortic dilatation (44 mm)  Goals of care Poor prognosis.  Will discuss with family today.   For questions or updates, please contact [facility] Please consult [email or web address] for contact info under        Signed, Abagail Kitchens, PA-C     Attending Note:   The patient was seen and examined.  Agree with assessment and plan as noted above.  Changes made to the above note as needed.  Patient seen and independently examined with Yvonna Alanis, PA .   We discussed all aspects of the encounter. I agree with the assessment and plan as stated above.    Acute on chronic HFrEF:   EF 20%.   His BP is better now that we placed the BP cuff on his leg.   He remains lethergic. Family meeting with wife and daughter - The are agreeable with considering transition from palliative to hospice care but want him to remain full code including intubation  We had a long discussion abut the limitation of inotropes and mechanical ventilation would offer   They agree that they would not be in favor of long term vent support or inotropes.   I have  reviewed the echo with Dr. Gasper Lloyd and he agrees that his echo is c/w endstage CHF He is not a candidate for any advanced therapies from a CHF standpoint    Continue same meds, same plan   I have spent a total of 40 minutes with patient reviewing hospital  notes , telemetry, EKGs, labs and examining patient as well as establishing an assessment and plan that was discussed with the patient.  > 50% of time was spent in direct patient care.    Vesta Mixer, Montez Hageman., MD, [facility] 04/30/2023, 2:07 PM [address]  [address] Office [phone] Pager [phone]

## 2023-04-30 NOTE — Plan of Care (Signed)
  Problem: Education: Goal: Knowledge of General Education information will improve Description Including pain rating scale, medication(s)/side effects and non-pharmacologic comfort measures Outcome: Progressing   

## 2023-04-30 NOTE — Progress Notes (Signed)
PT Cancellation Note  Patient Details Name: James Bryant MRN: [id number] DOB: 1946-07-16   Cancelled Treatment:    Reason Eval/Treat Not Completed: Medical issues which prohibited therapy. Canceled treatment today per RN for low BP. Will continue to follow.   Hilton Cork, PT, DPT Secure Chat Preferred  Rehab Office [phone]   Arturo Morton Brion Aliment 04/30/2023, 12:31 PM

## 2023-05-01 DIAGNOSIS — Z515 Encounter for palliative care: Secondary | ICD-10-CM | POA: Diagnosis not present

## 2023-05-01 DIAGNOSIS — Z7189 Other specified counseling: Secondary | ICD-10-CM | POA: Diagnosis not present

## 2023-05-01 DIAGNOSIS — I5043 Acute on chronic combined systolic (congestive) and diastolic (congestive) heart failure: Secondary | ICD-10-CM | POA: Diagnosis not present

## 2023-05-01 DIAGNOSIS — N179 Acute kidney failure, unspecified: Secondary | ICD-10-CM | POA: Diagnosis not present

## 2023-05-01 LAB — BASIC METABOLIC PANEL WITH GFR
Anion gap: 12 (ref 5–15)
BUN: 64 mg/dL — ABNORMAL HIGH (ref 8–23)
CO2: 32 mmol/L (ref 22–32)
Calcium: 9.2 mg/dL (ref 8.9–10.3)
Chloride: 90 mmol/L — ABNORMAL LOW (ref 98–111)
Creatinine, Ser: 2.42 mg/dL — ABNORMAL HIGH (ref 0.61–1.24)
GFR, Estimated: 27 mL/min — ABNORMAL LOW
Glucose, Bld: 85 mg/dL (ref 70–99)
Potassium: 3.6 mmol/L (ref 3.5–5.1)
Sodium: 134 mmol/L — ABNORMAL LOW (ref 135–145)

## 2023-05-01 LAB — CBC
HCT: 32.8 % — ABNORMAL LOW (ref 39.0–52.0)
Hemoglobin: 10.8 g/dL — ABNORMAL LOW (ref 13.0–17.0)
MCH: 41.5 pg — ABNORMAL HIGH (ref 26.0–34.0)
MCHC: 32.9 g/dL (ref 30.0–36.0)
MCV: 126.2 fL — ABNORMAL HIGH (ref 80.0–100.0)
Platelets: 154 K/uL (ref 150–400)
RBC: 2.6 MIL/uL — ABNORMAL LOW (ref 4.22–5.81)
RDW: 20.3 % — ABNORMAL HIGH (ref 11.5–15.5)
WBC: 3.9 K/uL — ABNORMAL LOW (ref 4.0–10.5)
nRBC: 0 % (ref 0.0–0.2)

## 2023-05-01 LAB — MAGNESIUM: Magnesium: 2.5 mg/dL — ABNORMAL HIGH (ref 1.7–2.4)

## 2023-05-01 MED ORDER — METOPROLOL SUCCINATE ER 25 MG PO TB24
12.5000 mg | ORAL_TABLET | Freq: Every day | ORAL | Status: DC
  Administered 2023-05-01 – 2023-05-03 (×3): 12.5 mg via ORAL
  Filled 2023-05-01 (×3): qty 1

## 2023-05-01 NOTE — Progress Notes (Signed)
Patient Name: James Bryant Date of Encounter: 05/01/2023 [facility] Cardiologist: Kristeen Miss, MD   Interval Summary  .    77 yo man with end stage biventricular CHF Moderate dementia   LVEF is < 20% Severe RV dysfunction   I had a family meeting yesterday  Discussed that patients options were very limited. I have discussed the case with Advanced CHF team ( Sabharwal) who agrees that there are little / nothing to offer from an advanced CHF standpoint   Family still wants to keep him as a full code.   He appears comfortable , sitting up in the chair    Vital Signs .    Vitals:   05/01/23 0402 05/01/23 0423 05/01/23 0739 05/01/23 0803  BP: 134/83  (!) 108/90 119/77  Pulse: (!) 121  (!) 135 (!) 102  Resp: 19  20   Temp: (!) 97.5 F (36.4 C)  (!) 96.8 F (36 C)   TempSrc: Oral  Axillary   SpO2: 97%  98% 98%  Weight:  68.3 kg    Height:        Intake/Output Summary (Last 24 hours) at 05/01/2023 0838 Last data filed at 05/01/2023 0006 Gross per 24 hour  Intake 720 ml  Output 700 ml  Net 20 ml      05/01/2023    4:23 AM 04/30/2023    4:02 AM 04/29/2023    4:35 AM  Last 3 Weights  Weight (lbs) 150 lb 9.2 oz 137 lb 2 oz 138 lb 3.7 oz  Weight (kg) 68.3 kg 62.2 kg 62.7 kg      Telemetry/ECG    Normal sinus rhythm with frequent episodes of SVT lasting for several minutes.  Did have occasional bout of nonsustained VT with roughly 10 beats.- Personally Reviewed  CV Studies    Echo 04/24/2023 1. Left ventricular ejection fraction, by estimation, is <20%. The left  ventricle has severely decreased function. The left ventricle demonstrates  global hypokinesis. The left ventricular internal cavity size was mildly  dilated. Left ventricular  diastolic parameters are consistent with Grade I diastolic dysfunction  (impaired relaxation). The interventricular septum is flattened in  diastole ('D' shaped left ventricle), consistent with right ventricular   volume overload.   2. Right ventricular systolic function is severely reduced. The right  ventricular size is severely enlarged.   3. Left atrial size was severely dilated.   4. Right atrial size was severely dilated.   5. The mitral valve is normal in structure. Mild mitral valve  regurgitation.   6. Tricuspid valve regurgitation is moderate to severe.   7. The aortic valve is tricuspid. There is mild calcification of the  aortic valve. Aortic valve regurgitation is mild to moderate. Aortic valve  sclerosis is present, with no evidence of aortic valve stenosis. Aortic  regurgitation PHT measures 325 msec.   8. Aortic dilatation noted. There is moderate dilatation of the aortic  root, measuring 44 mm.   Comparison(s): Prior images reviewed side by side. The right ventricular  function is worse (but was not normal on the previous study either) and  there is worsened tricuspid insufficiency.    Physical Exam .    Physical Exam: Blood pressure 119/77, pulse (!) 102, temperature (!) 96.8 F (36 C), temperature source Axillary, resp. rate 20, height 5\' 7"  (1.702 m), weight 68.3 kg, SpO2 98%.       GEN:  chronically ill , elderly male,  NAD  HEENT: Normal NECK: +++  JVD ,  LYMPHATICS: No lymphadenopathy CARDIAC:  RR , tachy  RESPIRATORY:  reduced breath sounds,  limited inspiratory effort  ABDOMEN: Soft, non-tender, non-distended MUSCULOSKELETAL:  trace edema  SKIN: Warm and dry NEUROLOGIC:  Alert and oriented x 3  Patient Profile    James Bryant is a 78 y.o. male has hx of chronic combined HFrEF, dementia, polycythemia, CKD and admitted on 04/19/2019 for for the evaluation of heart failure exacerbation   Assessment & Plan .      1.  Acute on chronic biventricular end stage CHF:     His prognosis is poor.  I have discussed this with his family.  They realize that his likelyhood of improvement is small . They wish to keep him as a full code .   I have discussed with  Dr. Gasper Lloyd who agrees that this is endstage CHF and would not recommend inotropes as that would be a "Bridge to nowhere"  He is currently being seen by palliative care . Family will consider transitioning to Hospice care when that time comes   No new recommendations    2.  Dementia        For questions or updates, please contact  [facility] Please consult [email or web address] for contact info under        Signed, Kristeen Miss, MD     Attending Note:   The patient was seen and examined.  Agree with assessment and plan as noted above.  Changes made to the above note as needed.  Patient seen and independently examined with Yvonna Alanis, PA .   We discussed all aspects of the encounter. I agree with the assessment and plan as stated above.    Acute on chronic HFrEF:   EF 20%.   His BP is better now that we placed the BP cuff on his leg.   He remains lethergic. Family meeting with wife and daughter - The are agreeable with considering transition from palliative to hospice care but want him to remain full code including intubation  We had a long discussion abut the limitation of inotropes and mechanical ventilation would offer   They agree that they would not be in favor of long term vent support or inotropes.   I have reviewed the echo with Dr. Gasper Lloyd and he agrees that his echo is c/w endstage CHF He is not a candidate for any advanced therapies from a CHF standpoint    Continue same meds, same plan   I have spent a total of 40 minutes with patient reviewing hospital  notes , telemetry, EKGs, labs and examining patient as well as establishing an assessment and plan that was discussed with the patient.  > 50% of time was spent in direct patient care.    Vesta Mixer, Montez Hageman., MD, [facility] 05/01/2023, 8:38 AM [address],  [address] Office [phone] Pager [phone]

## 2023-05-01 NOTE — Progress Notes (Signed)
Pt. HR sustaining in the 120s. Pt. Resting in bedside chair. No distress noted. On call for [facility] paged to make aware. Blood pressure 134/83, pulse (!) 121, temperature (!) 97.5 F (36.4 C), temperature source Oral, resp. rate 19, height 5\' 7"  (1.702 m), weight 68.3 kg, SpO2 97%.

## 2023-05-01 NOTE — Plan of Care (Signed)
  Problem: Education: Goal: Ability to demonstrate management of disease process will improve Outcome: Progressing Goal: Ability to verbalize understanding of medication therapies will improve Outcome: Progressing Goal: Individualized Educational Video(s) Outcome: Progressing   Problem: Activity: Goal: Capacity to carry out activities will improve Outcome: Progressing   Problem: Cardiac: Goal: Ability to achieve and maintain adequate cardiopulmonary perfusion will improve Outcome: Progressing   Problem: Education: Goal: Knowledge of General Education information will improve Description: Including pain rating scale, medication(s)/side effects and non-pharmacologic comfort measures Outcome: Progressing   Problem: Health Behavior/Discharge Planning: Goal: Ability to manage health-related needs will improve Outcome: Progressing   Problem: Clinical Measurements: Goal: Ability to maintain clinical measurements within normal limits will improve Outcome: Progressing Goal: Will remain free from infection Outcome: Progressing Goal: Diagnostic test results will improve Outcome: Progressing Goal: Respiratory complications will improve Outcome: Progressing Goal: Cardiovascular complication will be avoided Outcome: Progressing   Problem: Activity: Goal: Risk for activity intolerance will decrease Outcome: Progressing   Problem: Nutrition: Goal: Adequate nutrition will be maintained Outcome: Progressing   Problem: Coping: Goal: Level of anxiety will decrease Outcome: Progressing   Problem: Elimination: Goal: Will not experience complications related to bowel motility Outcome: Progressing Goal: Will not experience complications related to urinary retention Outcome: Progressing   Problem: Safety: Goal: Ability to remain free from injury will improve Outcome: Progressing   Problem: Skin Integrity: Goal: Risk for impaired skin integrity will decrease Outcome: Progressing    

## 2023-05-01 NOTE — Progress Notes (Signed)
OT Cancellation Note  Patient Details Name: DORN KARP MRN: [id number] DOB: Jun 14, 1946   Cancelled Treatment:    Reason Eval/Treat Not Completed: Medical issues which prohibited therapy (Patient's BP 85/56.  OT will re-attempt as able)  Denice Paradise 05/01/2023, 2:12 PM

## 2023-05-01 NOTE — Progress Notes (Signed)
Mobility Specialist Progress Note:    05/01/23 1005  Mobility  Activity Ambulated with assistance in room  Level of Assistance Minimal assist, patient does 75% or more  Assistive Device Front wheel walker  Distance Ambulated (ft) 30 ft  Activity Response Tolerated well  Mobility Referral Yes  $Mobility charge 1 Mobility  Mobility Specialist Start Time (ACUTE ONLY) 0902  Mobility Specialist Stop Time (ACUTE ONLY) 0915  Mobility Specialist Time Calculation (min) (ACUTE ONLY) 13 min   Pt received on BSC agreeable to ambulate in room. Pt needed MinA to stand and contact guard during ambulation. During session pts HR reached to the 150's, no c/o of pain. Returned to chair w/o fault HR went back down to the 120's. All needs met and sitter in room. Chair alarm on.  Thompson Grayer Mobility Specialist  Please contact vis Secure Chat or  Rehab Office [phone]

## 2023-05-01 NOTE — Progress Notes (Signed)
PROGRESS NOTE    AXYL NIEMEIER  YNW:[id number] DOB: 1945/08/30 DOA: 04/19/2023 PCP: Jac Canavan, PA-C    Brief Narrative:   [name] is a 77 y.o. male with past medical history of dementia, HTN, CHF EF 20-25%, CKD IIIa baseline 1.6 and polycythemia initially presented to hospital with abnormal labs, edema, lethargy weight gain for 1 to 2 weeks prior to presentation.  Patient was then admitted to the hospital for CHF flare, failed outpatient management.  Assessment and Plan:  * Acute on chronic combined systolic and diastolic CHF  Patient does have end-stage congestive heart failure.  Continue strict intake and output charting.  Negative balance for 65784 mL so far . Was on IV Lasix drip which was changed to IV 60 mg twice daily but now with poor output, cool extremities, hypotension, ongoing with peripheral edema and hypervolemia lasix has been discontinued.  Cardiology following and patient is a very poor candidate for advanced heart failure treatment and there is not much options for treatment.  Patient's daughter have been updated on this multiple times but despite that family wishes to continue with the doable treatment.  2D echocardiogram from 04/24/2023 shows LV ejection fraction less than 20% with severely decreased LV function and global hypokinesis with grade 1 diastolic dysfunction.  Overall poor prognosis.  Patient has been tachycardic today so spoke with cardiology and we will give a trial of low-dose Toprol-XL 12.5 today.  Patient used to be hospice candidate in the past but family have refused hospice treatment at this time.  Will continue to have conversation with the family but was unable to reach out to them today.  Acute hypoxia without respiratory failure.  Improved with diuretic.  Now on room air.  Patient feels dyspneic and short winded today.   Acute renal failure superimposed on stage 3a chronic kidney disease  Creatinine more than 3 on admission.  Latest  creatinine  at 2.4 from 2.7 <2.4<  2.5 <2.6 < 2.7.  Baseline creatinine around 1.6.  Lasix on hold at this time.  Hypermagnesemia.  Mild.  Latest magnesium of 2.5  Severe dementia with behavioral disturbances and agitation - Continue donepezil/Zyprexa.  Has sitter at bedside.     Essential hypertension On aspirin, low-dose metoprolol initiated today due to tachycardia.  ARB on hold.  Will hold off with  Lasix today    Polycythemia vera (HCC) - Continue hydroxyurea.  Latest hemoglobin of 11   Hyperkalemia Improved.  Check BMP in AM.   Hyponatremia Mild.  Latest sodium of 134.  Check BMP in AM.   Left knee effusion Without pain. Low suspicion for infection  Goals of care.  Patient with low EF with dementia, end-stage heart failure..  Patient used to be in hospice in the past but currently not on hospice.  Family wished to proceed with current level of care but patient not improving.  I tried to reach out to the the family again today but unable to reach them today.  Extremely poor prognosis.   DVT prophylaxis: Place TED hose Start: 04/23/23 1752 enoxaparin (LOVENOX) injection 30 mg Start: 04/19/23 1600   Code Status:     Code Status: Full Code  Disposition: Uncertain at this time, pending clinical improvement.  Poor prognosis.  Status is: Inpatient  Remains inpatient appropriate because: Advanced heart failure, pending clinical improvement   Family Communication:  Spoke with the patient's daughter at 04/29/23, unable to reach the patient's daughter and spouse on the phone today.  Consultants:  Cardiology  Procedures:  None  Antimicrobials:  None  Anti-infectives (From admission, onward)    None       Subjective: Today, patient was seen and examined at bedside.  Patient states that he does not feel well complains of shortness of breath some chest discomfort and dyspnea.  Seen by cardiology and not much to offer.  Nursing staff reported tachycardia as well.     Objective: Vitals:   05/01/23 0803 05/01/23 0936 05/01/23 1000 05/01/23 1144  BP: 119/77 107/78 105/65 (!) 84/57  Pulse: (!) 102 (!) 129  86  Resp:  20 20 14   Temp:  (!) 97.1 F (36.2 C) (!) 97.1 F (36.2 C) (!) 97.1 F (36.2 C)  TempSrc:  Axillary Axillary Oral  SpO2: 98%  98% 96%  Weight:      Height:        Intake/Output Summary (Last 24 hours) at 05/01/2023 1300 Last data filed at 05/01/2023 1200 Gross per 24 hour  Intake 490 ml  Output 1100 ml  Net -610 ml   Filed Weights   04/29/23 0435 04/30/23 0402 05/01/23 0423  Weight: 62.7 kg 62.2 kg 68.3 kg    Physical Examination: Body mass index is 23.58 kg/m.   General:  Average built, not in obvious distress, deconditioned and frail elderly male.  Sitting up up in the bedside, mildly sleepy. HENT:   Distended neck vein.  Oral mucosa moist Chest:    Diminished breath sounds bilaterally.   CVS: S1 &S2 heard.  Tachycardic  abdomen: Soft, nontender, nondistended.  Bowel sounds are heard.   Extremities: No cyanosis, clubbing with bilateral lower extremity  pitting edema  Psych: Alert awake and, Communicative,dementia,  CNS:  No cranial nerve deficits.  Moves all extremities.  Sitting up in the bedside. Skin: Warm and dry.  No rashes noted.  Data Reviewed:   CBC: Recent Labs  Lab 04/25/23 0305 04/26/23 0317 04/27/23 0333 04/28/23 0313 05/01/23 0315  WBC 3.0* 3.2* 3.7* 3.2* 3.9*  HGB 10.6* 10.3* 11.4* 11.0* 10.8*  HCT 32.5* 31.2* 34.2* 34.0* 32.8*  MCV 124.0* 122.8* 122.6* 124.5* 126.2*  PLT 146* 164 180 165 154    Basic Metabolic Panel: Recent Labs  Lab 04/25/23 0305 04/26/23 0317 04/27/23 0333 04/28/23 0313 04/29/23 0948 04/30/23 0257 05/01/23 0315  NA 136 135 135 134* 139 136 134*  K 3.9 3.6 4.1 4.5 4.8 3.6 3.6  CL 94* 87* 89* 90* 89* 90* 90*  CO2 27 33* 31 26 33* 34* 32  GLUCOSE 110* 86 81 62* 134* 93 85  BUN 68* 67* 64* 62* 67* 65* 64*  CREATININE 2.72* 2.63* 2.57* 2.40* 2.59* 2.71* 2.42*   CALCIUM 9.1 9.1 9.3 9.0 9.1 9.1 9.2  MG 2.5* 2.5* 2.4 2.4  --   --  2.5*    Liver Function Tests: No results for input(s): "AST", "ALT", "ALKPHOS", "BILITOT", "PROT", "ALBUMIN" in the last 168 hours.    Radiology Studies: No results found.    LOS: 10 days    Joycelyn Das, MD [facility] Available via Epic secure chat 7am-7pm After these hours, please refer to coverage provider listed on [email or web address] 05/01/2023, 1:00 PM

## 2023-05-01 NOTE — Progress Notes (Signed)
Physical Therapy Treatment Patient Details Name: James Bryant MRN: [id number] DOB: 01/15/1946 Today's Date: 05/01/2023   History of Present Illness Pt is a 77 y/o male presenting with abnormal labs, edema and lethargy.  Work up includes acute on chronic combined systolic and diastolic CHF.  PMHx:  CHF, Dementia, HTN    PT Comments  Pt in chair upon arrival with sitter present. Pt agreeable to PT session. Pt's BP upon arrival was 122/71,86 with VSS on room air. Pt was agreeable to complete exercises in sitting, however, declined standing. Pt was quick to fatigue and needed verbal and tactile cues to perform exercises. Pt is progressing slowly towards goals. Acute PT to follow.     If plan is discharge home, recommend the following: A little help with walking and/or transfers;Assistance with cooking/housework;Assist for transportation;Assistance with feeding;Help with stairs or ramp for entrance;A little help with bathing/dressing/bathroom;Supervision due to cognitive status           Precautions / Restrictions Precautions Precautions: Fall Precaution Comments: sitter present Restrictions Weight Bearing Restrictions: No     Mobility  Bed Mobility Overal bed mobility:  (pt in chair upon arrival)       Transfers Overall transfer level:  (pt declined wanting to transfer today)          Balance Overall balance assessment: Mild deficits observed, not formally tested Sitting-balance support: Feet supported, Bilateral upper extremity supported Sitting balance-Leahy Scale: Fair Sitting balance - Comments: in recliner Postural control: Left lateral lean (pt able to correct spontaneously, not to command)              Cognition Arousal: Obtunded (brief periods of alertness) Behavior During Therapy: Flat affect Overall Cognitive Status: History of cognitive impairments - at baseline            General Comments: Oriented to self        Exercises General Exercises -  Lower Extremity Ankle Circles/Pumps: AROM, 10 reps, Both, Seated Quad Sets: AROM, Both, Seated (3 sec hold) Long Arc Quad: AROM, Seated, 10 reps, Both Shoulder Exercises Elbow Flexion: AROM, Seated, Both, 5 reps Other Exercises Other Exercises: isometric hip ABD in sitting, 3 sec hold, x5 reps    General Comments General comments (skin integrity, edema, etc.): VSS on RA throughout session, BP122/71,86      Pertinent Vitals/Pain Pain Assessment Pain Assessment: No/denies pain           PT Goals (current goals can now be found in the care plan section) Progress towards PT goals: Progressing toward goals    Frequency    Min 1X/week      PT Plan     AM-PAC PT "6 Clicks" Mobility   Outcome Measure  Help needed turning from your back to your side while in a flat bed without using bedrails?: A Little Help needed moving from lying on your back to sitting on the side of a flat bed without using bedrails?: A Little Help needed moving to and from a bed to a chair (including a wheelchair)?: A Little Help needed standing up from a chair using your arms (e.g., wheelchair or bedside chair)?: A Little Help needed to walk in hospital room?: A Little Help needed climbing 3-5 steps with a railing? : A Lot 6 Click Score: 17    End of Session   Activity Tolerance: Patient limited by fatigue Patient left: in chair;with call bell/phone within reach;with nursing/sitter in room Nurse Communication: Mobility status PT Visit Diagnosis: Unsteadiness on feet (  R26.81);Muscle weakness (generalized) (M62.81);Difficulty in walking, not elsewhere classified (R26.2)     Time: 1450-1503 PT Time Calculation (min) (ACUTE ONLY): 13 min  Charges:    $Therapeutic Exercise: 8-22 mins PT General Charges $$ ACUTE PT VISIT: 1 Visit                    Hilton Cork, PT, DPT Secure Chat Preferred  Rehab Office [phone]    Arturo Morton Brion Aliment 05/01/2023, 3:39 PM

## 2023-05-02 ENCOUNTER — Other Ambulatory Visit: Payer: Self-pay | Admitting: Medical

## 2023-05-02 DIAGNOSIS — Z515 Encounter for palliative care: Secondary | ICD-10-CM | POA: Diagnosis not present

## 2023-05-02 DIAGNOSIS — N179 Acute kidney failure, unspecified: Secondary | ICD-10-CM | POA: Diagnosis not present

## 2023-05-02 DIAGNOSIS — I5043 Acute on chronic combined systolic (congestive) and diastolic (congestive) heart failure: Secondary | ICD-10-CM | POA: Diagnosis not present

## 2023-05-02 DIAGNOSIS — Z7189 Other specified counseling: Secondary | ICD-10-CM | POA: Diagnosis not present

## 2023-05-02 LAB — CBC
HCT: 33 % — ABNORMAL LOW (ref 39.0–52.0)
Hemoglobin: 10.9 g/dL — ABNORMAL LOW (ref 13.0–17.0)
MCH: 41.3 pg — ABNORMAL HIGH (ref 26.0–34.0)
MCHC: 33 g/dL (ref 30.0–36.0)
MCV: 125 fL — ABNORMAL HIGH (ref 80.0–100.0)
Platelets: 168 10*3/uL (ref 150–400)
RBC: 2.64 MIL/uL — ABNORMAL LOW (ref 4.22–5.81)
RDW: 20.4 % — ABNORMAL HIGH (ref 11.5–15.5)
WBC: 3.7 10*3/uL — ABNORMAL LOW (ref 4.0–10.5)
nRBC: 0 % (ref 0.0–0.2)

## 2023-05-02 LAB — BASIC METABOLIC PANEL
Anion gap: 11 (ref 5–15)
BUN: 66 mg/dL — ABNORMAL HIGH (ref 8–23)
CO2: 32 mmol/L (ref 22–32)
Calcium: 9.3 mg/dL (ref 8.9–10.3)
Chloride: 90 mmol/L — ABNORMAL LOW (ref 98–111)
Creatinine, Ser: 2.34 mg/dL — ABNORMAL HIGH (ref 0.61–1.24)
GFR, Estimated: 28 mL/min — ABNORMAL LOW (ref >60)
Glucose, Bld: 86 mg/dL (ref 70–99)
Potassium: 3.6 mmol/L (ref 3.5–5.1)
Sodium: 133 mmol/L — ABNORMAL LOW (ref 135–145)

## 2023-05-02 LAB — MAGNESIUM: Magnesium: 2.8 mg/dL — ABNORMAL HIGH (ref 1.7–2.4)

## 2023-05-02 NOTE — Plan of Care (Signed)
  Problem: Education: Goal: Ability to demonstrate management of disease process will improve Outcome: Not Progressing Goal: Ability to verbalize understanding of medication therapies will improve Outcome: Not Progressing Goal: Individualized Educational Video(s) Outcome: Not Progressing   Problem: Activity: Goal: Capacity to carry out activities will improve Outcome: Not Progressing   Problem: Cardiac: Goal: Ability to achieve and maintain adequate cardiopulmonary perfusion will improve Outcome: Not Progressing   

## 2023-05-02 NOTE — Progress Notes (Signed)
Discussed plan of care with James Alanis, PA. Cephus Shelling is aware of low BPs and tachycardia. No new orders received.

## 2023-05-02 NOTE — Progress Notes (Signed)
Patient ID: James Bryant, male   DOB: 12-01-1945, 77 y.o.   MRN: [id number]    Progress Note from the Palliative Medicine Team at [facility]   Patient Name: James Bryant        Date: 05/02/2023 DOB: [birth year]  Age: 77 y.o. MRN#: [id number] Attending Physician: Joycelyn Das, MD Primary Care Physician: Jac Canavan, PA-C Admit Date: 04/19/2023   Reason for Consultation/Follow-up   Establishing GOCs   HPI/ Brief Hospital Review   77 y.o. male  admitted on 04/19/2023 with   past medical history of dementia, HTN, CHF EF 20-25%, CKD IIIa baseline 1.6 and polycythemia initially presented to hospital with abnormal labs edema lethargy weight gain for 1 to 2 weeks prior to presentation.    Patient  admitted to the hospital for CHF flare; treatment and stabilization  Patient currently receiving services from a [facility]/outpatient palliative, at some point in time he was under hospice service with them also.  Family report continued physical functional and cognitive decline over the past year.  Patient's wife and daughter are main caregivers at home.   Patient does not have medical decision-making capacity.  Family face ongoing treatment option decisions, advanced directive decisions and anticipatory care needs.  Family face treatment option decisions, advanced directive decisions and anticipatory care needs.   Subjective  Extensive chart review has been completed prior to meeting with patient/family  including labs, vital signs, imaging, progress/consult notes, orders, medications and available advance directive documents.    This NP assessed patient at the bedside as a follow up for palliative medicine needs and emotional support.  Patient is sleeping comfortably in the chair.  Call to daughter for ongoing education regarding current medical situation and ongoing decisions regarding treatment options, advanced care planning options and anticipatory care  needs.  Complexity and seriousness of patient's multiple comorbidities reviewed.  Education offered on the limitations of medical interventions to prolong quality of life within the context of patient's current medical situation specific to his end-stage heart failure and end-stage dementia.  Education offered on the difference between a full medical support path and a palliative comfort path for this patient, at this time in his situation.  Education offered on hospice benefit; philosophy and eligibility.  We discussed both hospice at home versus inpatient hospice residential.  They verbalized understanding and wished to proceed with plan of care as outlined below.  Plan of care: -Patient remains full code at family request    -Recommendation is for DNR/DNI status understanding evidenced based poor outcomes in similar hospitalized patient, as the cause of arrest is likely associated with advanced chronic illness rather than an easily reversible acute cardio-pulmonary event. -Family is open to all offered and available medical interventions to prolong life, they verbalized that comfort is ultimately the focus of care -Family is interested in hospice services with [facility] when stable for discharge. Poor prognosis  Will place referral   Family stressed the importance of staying in communication with PCP Dr. Aleen Campi  Education offered today regarding  the importance of continued conversation with family and their  medical providers regarding overall plan of care and treatment options,  ensuring decisions are within the context of the patients values and GOCs.  Questions and concerns addressed      Time:  50  minutes Detailed review of medical records ( labs, imaging, vital signs), medically appropriate exam ( MS, skin, cardia,  resp)   discussed with treatment team, counseling and education  to patient, family, staff, documenting clinical information, medication management,  coordination of care    Lorinda Creed NP  Palliative Medicine Team Team Phone # [phone] Pager [phone]

## 2023-05-02 NOTE — Progress Notes (Signed)
Mobility Specialist Progress Note:    05/02/23 1108  Mobility  Activity Ambulated with assistance in room  Level of Assistance Minimal assist, patient does 75% or more  Assistive Device Front wheel walker  Distance Ambulated (ft) 30 ft  Activity Response Tolerated well  Mobility Referral Yes  $Mobility charge 1 Mobility  Mobility Specialist Start Time (ACUTE ONLY) 1030  Mobility Specialist Stop Time (ACUTE ONLY) 1045  Mobility Specialist Time Calculation (min) (ACUTE ONLY) 15 min   Received pt in chair having no complaints and agreeable to mobility. Pt was asymptomatic throughout ambulation. Left in chair w/ call bell in reach and all needs met.   Thompson Grayer Mobility Specialist  Please contact vis Secure Chat or  Rehab Office [phone]

## 2023-05-02 NOTE — Progress Notes (Signed)
PROGRESS NOTE    HEMANTH MASCIOLI  ZOX:[id number] DOB: [birth year] DOA: 04/19/2023 PCP: Jac Canavan, PA-C    Brief Narrative:   Mr. Dunavin is a 77 y.o. male with past medical history of dementia, HTN, CHF EF 20-25%, CKD IIIa baseline 1.6 and polycythemia initially presented to hospital with abnormal labs, edema, lethargy weight gain for 1 to 2 weeks prior to presentation.  Patient was then admitted to the hospital for CHF flare, failed outpatient management.  Assessment and Plan:  * Acute on chronic combined systolic and diastolic CHF  Patient does have end-stage congestive heart failure.  Continue strict intake and output charting.  Negative balance for 81191 mL so far . Was on IV Lasix drip which was changed to IV 60 mg twice daily but now with poor output, cool extremities, hypotension, ongoing with peripheral edema and hypervolemia lasix has been discontinued.  Cardiology following and patient is a very poor candidate for advanced heart failure treatment and there is not much options for treatment.    2D echocardiogram from 04/24/2023 shows LV ejection fraction less than 20% with severely decreased LV function and global hypokinesis with grade 1 diastolic dysfunction.  Overall poor prognosis.  Communicated with cardiology again since the patient appeared to be hypotensive again today.  Communicated with the patient's daughter again today and at this time she wishes to proceed with hospice at home.  Understands poor prognosis and limited options.  Palliative care on board as well.  Acute hypoxia without respiratory failure.  Improved with diuretic.  Now on room air.  Patient however continues to have low blood pressure and does not feel good.    Acute renal failure superimposed on stage 3a chronic kidney disease  Creatinine more than 3 on admission.  Latest creatinine  at 2.3 from initial 2.7.  Baseline creatinine around 1.6.  Lasix on hold at this time due to low blood  pressure..  Hypermagnesemia.  Mild.  Latest magnesium of 2.8.  Magnesium supplements on hold  Severe dementia with behavioral disturbances and agitation - Continue donepezil/Zyprexa.  Has sitter at bedside.     Essential hypertension Unable to tolerate other medications including ARB Lasix or beta-blocker.  Will put with holding parameters.   Polycythemia vera (HCC) - Continue hydroxyurea.  Latest hemoglobin of 10.9   Hyperkalemia Improved.  Latest potassium was 3.6.   Hyponatremia Mild.  Latest sodium of 133.     Left knee effusion Without pain. Low suspicion for infection  Goals of care.  Patient with low EF with dementia, end-stage heart failure..  Patient used to be in hospice in the past but currently not on hospice. Extremely poor prognosis.  Palliative care on board.  I again had a long conversation with the patient's daughter today who is willing to proceed with hospice care at home.  TOC has been consulted.   DVT prophylaxis: Place TED hose Start: 04/23/23 1752 enoxaparin (LOVENOX) injection 30 mg Start: 04/19/23 1600   Code Status:     Code Status: Full Code  Disposition: Hospice at home when arranged, poor prognosis.  Status is: Inpatient  Remains inpatient appropriate because: Advanced heart failure, need for hospice at home,   Family Communication:  Spoke with the patient's daughter at 05/02/23 and updated her about the clinical condition of the patient and spoke about disposition to hospice on discharge.  Consultants:  Cardiology  Procedures:  None  Antimicrobials:  None  Anti-infectives (From admission, onward)    None  Subjective: Today, patient was seen and examined at bedside.  Patient is persistent but denies any pain, nausea vomiting or shortness of breath.  Nursing staff reported that his blood pressure was very low with systolic in the 70s.  Communicated with cardiology with no good recommendations with advanced end-stage heart  failure.  Cardiology recommending hospice level of care.  Spoke with patient's daughter who is open to hospice at this time.  Objective: Vitals:   05/02/23 1004 05/02/23 1043 05/02/23 1050 05/02/23 1102  BP: (!) 84/54 (!) 80/54 (!) 67/45 (!) 75/55  Pulse:      Resp:  18    Temp:  (!) 97.2 F (36.2 C)    TempSrc:  Axillary    SpO2:  98%    Weight:  64.5 kg    Height:        Intake/Output Summary (Last 24 hours) at 05/02/2023 1104 Last data filed at 05/02/2023 0810 Gross per 24 hour  Intake 510 ml  Output 301 ml  Net 209 ml   Filed Weights   04/30/23 0402 05/01/23 0423 05/02/23 1043  Weight: 62.2 kg 68.3 kg 64.5 kg    Physical Examination: Body mass index is 22.27 kg/m.   General:   elderly male extremely deconditioned and frail.,  HENT:   Distended neck vein.  Oral mucosa moist Chest:    Diminished breath sounds bilaterally.  Coarse breath sounds noted. CVS: S1 &S2 heard.   abdomen: Soft, nontender, nondistended.  Bowel sounds are heard.   Extremities: No cyanosis, clubbing with bilateral lower extremity  pitting edema  Psych: Alert awake and, Communicative,dementia,  CNS:  No cranial nerve deficits.  Moves all extremities.   Skin: Warm and dry.  No rashes noted.  Data Reviewed:   CBC: Recent Labs  Lab 04/26/23 0317 04/27/23 0333 04/28/23 0313 05/01/23 0315 05/02/23 0424  WBC 3.2* 3.7* 3.2* 3.9* 3.7*  HGB 10.3* 11.4* 11.0* 10.8* 10.9*  HCT 31.2* 34.2* 34.0* 32.8* 33.0*  MCV 122.8* 122.6* 124.5* 126.2* 125.0*  PLT 164 180 165 154 168    Basic Metabolic Panel: Recent Labs  Lab 04/26/23 0317 04/27/23 0333 04/28/23 0313 04/29/23 0948 04/30/23 0257 05/01/23 0315 05/02/23 0424  NA 135 135 134* 139 136 134* 133*  K 3.6 4.1 4.5 4.8 3.6 3.6 3.6  CL 87* 89* 90* 89* 90* 90* 90*  CO2 33* 31 26 33* 34* 32 32  GLUCOSE 86 81 62* 134* 93 85 86  BUN 67* 64* 62* 67* 65* 64* 66*  CREATININE 2.63* 2.57* 2.40* 2.59* 2.71* 2.42* 2.34*  CALCIUM 9.1 9.3 9.0 9.1 9.1  9.2 9.3  MG 2.5* 2.4 2.4  --   --  2.5* 2.8*    Liver Function Tests: No results for input(s): "AST", "ALT", "ALKPHOS", "BILITOT", "PROT", "ALBUMIN" in the last 168 hours.    Radiology Studies: No results found.    LOS: 11 days    Joycelyn Das, MD [facility] Available via Epic secure chat 7am-7pm After these hours, please refer to coverage provider listed on [email or web address] 05/02/2023, 11:04 AM

## 2023-05-02 NOTE — Progress Notes (Signed)
Patient ID: James Bryant, male   DOB: 06-Sep-1945, 77 y.o.   MRN: [id number]    Progress Note from the Palliative Medicine Team at [facility]   Patient Name: James Bryant        Date: 05/02/2023 DOB: 1946/07/12  Age: 77 y.o. MRN#: [id number] Attending Physician: Joycelyn Das, MD Primary Care Physician: Jac Canavan, PA-C Admit Date: 04/19/2023   Reason for Consultation/Follow-up   Establishing GOCs   HPI/ Brief Hospital Review   77 y.o. male  admitted on 04/19/2023 with   past medical history of dementia, HTN, CHF EF 20-25%, CKD IIIa baseline 1.6 and polycythemia initially presented to hospital with abnormal labs edema lethargy weight gain for 1 to 2 weeks prior to presentation.    Patient  admitted to the hospital for CHF flare; treatment and stabilization  Patient currently receiving services from a [facility]/outpatient palliative, at some point in time he was under hospice service with them also.  Family report continued physical functional and cognitive decline over the past year.  Patient's wife and daughter are main caregivers at home.   Patient does not have medical decision-making capacity.  Family face ongoing treatment option decisions, advanced directive decisions and anticipatory care needs.  Family face treatment option decisions, advanced directive decisions and anticipatory care needs.   Subjective  Extensive chart review has been completed prior to meeting with patient/family  including labs, vital signs, imaging, progress/consult notes, orders, medications and available advance directive documents.    This NP assessed patient at the bedside as a follow up for palliative medicine needs and emotional support.  Patient's wife and daughter at bedside  Extensive, detailed education regarding the diagnosis of end-stage dementia and its natural trajectory; specific to continued changes in cognition, decreased oral intake, increased sleep  throughout the day,I associated behavioral disturbancesncontinence, increased risk of infections.  Urinary tract and respiratory infection.  Education offered on patient's significant end-stage heart failure/EF 20%.  Patient's low cardiac output selective in soft blood pressures and decreased urinary output.  Education offered on the concepts specific to adult failure to thrive and the limitations of medical interventions to prolong quality of life.  Education offered on hospice benefit; philosophy and eligibility.  We discussed both hospice at home versus inpatient hospice residential.   Education offered today regarding  the importance of continued conversation with family and their  medical providers regarding overall plan of care and treatment options,  ensuring decisions are within the context of the patients values and GOCs.  Questions and concerns addressed     And is to reconnect in the morning for further clarification of goals of care   Time:  50  minutes Detailed review of medical records ( labs, imaging, vital signs), medically appropriate exam ( MS, skin, cardia,  resp)   discussed with treatment team, counseling and education to patient, family, staff, documenting clinical information, medication management, coordination of care    Advanced Care Planning  Patient does not have medical decision-making capacity at this time.  Patient's wife and daughter were open to conversation/occasion regarding advance care planning.   Education offered on the  importance of documentation of  advanced directives, enhancing patient centered care.        Introduced and utilized MOST form to discuss elements of advance care planning.  A copy of MOST form was left with family for review  CODE STATUS discussed. Educated family to consider DNR/DNI status understanding evidenced based poor outcomes in similar  hospitalized patient, as the cause of arrest is likely associated with advanced chronic  illness rather than an easily reversible acute cardio-pulmonary event.   Artificial feeding and hydration-education offered on risks and benefits.  Education offered on concept of natural dehydration at end-of-life.  Education offered on the difference between a full medical support path and a path focused on comfort and dignity allowing for natural death.   Plan of care: -Patient remains full code at family request -Family is open to all offered and available medical interventions to prolong life. -Family is in process of consideration of hospice benefits at home on discharge   Time 20 minutes   Lorinda Creed NP  Palliative Medicine Team Team Phone # [phone] Pager [phone]

## 2023-05-02 NOTE — Plan of Care (Signed)
  Problem: Education: Goal: Ability to demonstrate management of disease process will improve Outcome: Not Progressing Goal: Ability to verbalize understanding of medication therapies will improve Outcome: Not Progressing   Problem: Activity: Goal: Capacity to carry out activities will improve Outcome: Not Progressing   Problem: Cardiac: Goal: Ability to achieve and maintain adequate cardiopulmonary perfusion will improve Outcome: Not Progressing   Problem: Education: Goal: Knowledge of General Education information will improve Description: Including pain rating scale, medication(s)/side effects and non-pharmacologic comfort measures Outcome: Not Progressing   Problem: Health Behavior/Discharge Planning: Goal: Ability to manage health-related needs will improve Outcome: Not Progressing   Problem: Clinical Measurements: Goal: Respiratory complications will improve Outcome: Not Progressing   Problem: Activity: Goal: Risk for activity intolerance will decrease Outcome: Not Progressing   Problem: Coping: Goal: Level of anxiety will decrease Outcome: Not Progressing   Problem: Skin Integrity: Goal: Risk for impaired skin integrity will decrease Outcome: Not Progressing

## 2023-05-02 NOTE — Progress Notes (Signed)
Occupational Therapy Treatment Patient Details Name: James Bryant MRN: [id number] DOB: 12-13-1945 Today's Date: 05/02/2023   History of present illness Pt is a 77 y/o male presenting with abnormal labs, edema and lethargy.  Work up includes acute on chronic combined systolic and diastolic CHF.  PMHx:  CHF, Dementia, HTN   OT comments  Pt with increased alertness and improved activity participation this session. OT instructed pt in techniques for increased safety and independence with ADLs, bed mobility, and functional transfers/ambulation. Pt currently demonstrates ability to complete U ADLs with Set up to Mod assist, LB ADLs with Max to Total assist, bed mobility with Contact guard to Min assist, and functional transfers/mobility with a RW with Contact guard to Min assist. Pt requires cues for initiation, sequencing, and safety during tasks. Pt is making progress toward OT goals. Pt will benefit from continued acute skilled OT services to address deficits outlined below, decrease caregiver burden, and increase safety and independence with functional tasks. Post acute discharge, pt will benefit from continued skilled OT services in the home to maximize rehab potential and for additional family training.       If plan is discharge home, recommend the following:  A little help with walking and/or transfers;A lot of help with bathing/dressing/bathroom;Assistance with cooking/housework;Assistance with feeding;Direct supervision/assist for medications management;Direct supervision/assist for financial management;Assist for transportation;Help with stairs or ramp for entrance;Supervision due to cognitive status (Set up and initiation cues for self feeding)   Equipment Recommendations  None recommended by OT    Recommendations for Other Services      Precautions / Restrictions Precautions Precautions: Fall Precaution Comments: sitter present Restrictions Weight Bearing Restrictions: No        Mobility Bed Mobility Overal bed mobility: Needs Assistance Bed Mobility: Supine to Sit, Sit to Supine     Supine to sit: Contact guard, HOB elevated, Used rails Sit to supine: Min assist   General bed mobility comments: cues for sequencing and hand placement/technique and with increased time    Transfers Overall transfer level: Needs assistance Equipment used: Rolling walker (2 wheels) Transfers: Sit to/from Stand, Bed to chair/wheelchair/BSC Sit to Stand: Min assist     Step pivot transfers: Min assist     General transfer comment: Min assist for rise; cues for proper RW management. Impulsive with movements; increased time     Balance Overall balance assessment: Needs assistance Sitting-balance support: Single extremity supported, No upper extremity supported, Feet supported Sitting balance-Leahy Scale: Fair     Standing balance support: Bilateral upper extremity supported, Reliant on assistive device for balance, During functional activity Standing balance-Leahy Scale: Poor Standing balance comment: verbal cues to come to full stand initially; reliant on RW for stability                           ADL either performed or assessed with clinical judgement   ADL Overall ADL's : Needs assistance/impaired     Grooming: Wash/dry face;Wash/dry hands;Contact guard assist;Sitting;Cueing for sequencing (at EOB; cues for initation)           Upper Body Dressing : Moderate assistance;Sitting;Cueing for sequencing (Cues for initiation)   Lower Body Dressing: Maximal assistance;Sit to/from stand (donning socks sitting EOB)   Toilet Transfer: Minimal assistance;Cueing for safety;Cueing for sequencing;Ambulation;BSC/3in1;Rolling walker (2 wheels) (with increased time)   Toileting- Clothing Manipulation and Hygiene: Total assistance;Sit to/from stand       Functional mobility during ADLs: Contact guard assist;Minimal assistance;Rolling walker (  2 wheels);Cueing  for safety;Cueing for sequencing      Extremity/Trunk Assessment Upper Extremity Assessment Upper Extremity Assessment: Generalized weakness RUE Coordination: decreased fine motor (worse on Left) LUE Coordination: decreased fine motor (worse on Left)   Lower Extremity Assessment Lower Extremity Assessment: Defer to PT evaluation        Vision       Perception     Praxis      Cognition Arousal: Lethargic Behavior During Therapy: Flat affect Overall Cognitive Status: History of cognitive impairments - at baseline                                 General Comments: Oriented to self. Pt continues to be lethargic but presents with increased alertness and improved active participaiton this session with pt demonstrating ability to follow 8/10 one step commands with increased time.        Exercises      Shoulder Instructions       General Comments VSS on RA throughout session    Pertinent Vitals/ Pain       Pain Assessment Pain Assessment: No/denies pain Pain Intervention(s): Monitored during session  Home Living                                          Prior Functioning/Environment              Frequency  Min 1X/week        Progress Toward Goals  OT Goals(current goals can now be found in the care plan section)  Progress towards OT goals: Progressing toward goals  Acute Rehab OT Goals Patient Stated Goal: To get out of bed  Plan      Co-evaluation                 AM-PAC OT "6 Clicks" Daily Activity     Outcome Measure   Help from another person eating meals?: A Little Help from another person taking care of personal grooming?: A Little Help from another person toileting, which includes using toliet, bedpan, or urinal?: Total Help from another person bathing (including washing, rinsing, drying)?: A Lot Help from another person to put on and taking off regular upper body clothing?: A Little Help from another  person to put on and taking off regular lower body clothing?: A Lot 6 Click Score: 14    End of Session Equipment Utilized During Treatment: Gait belt;Rolling walker (2 wheels)  OT Visit Diagnosis: Other abnormalities of gait and mobility (R26.89);Muscle weakness (generalized) (M62.81);Ataxia, unspecified (R27.0);Other (comment) (Decreased activity tolerance)   Activity Tolerance Patient tolerated treatment well   Patient Left in bed;with call bell/phone within reach;with nursing/sitter in room   Nurse Communication Mobility status        Time: 1610-9604 OT Time Calculation (min): 20 min  Charges: OT General Charges $OT Visit: 1 Visit OT Treatments $Self Care/Home Management : 8-22 mins  James Bryant "Orson Eva., OTR/L, MA Acute Rehab [phone]   Lendon Colonel 05/02/2023, 2:43 PM

## 2023-05-02 NOTE — Progress Notes (Addendum)
Note created error

## 2023-05-02 NOTE — TOC Progression Note (Addendum)
Transition of Care [facility]) - Progression Note    Patient Details  Name: James Bryant MRN: [id number] Date of Birth: [birth year]  Transition of [facility]) CM/SW Contact  Leone Haven, RN Phone Number: 05/02/2023, 11:56 AM  Clinical Narrative:    NCM received referral for home hospice, patient already with [facility] for outpatient palliative services, NCM spoke with daughter on the phone, she states they want to do home hospice with [facility].  NCM made referral to [facility]. He states he will give daughter a call to get DME set up and things moving. Patient will most likely need ambulance transport. Most likely will need DME to be delivered prior to dc.   Expected Discharge Plan: Home w Hospice Care Barriers to Discharge: Continued Medical Work up  Expected Discharge Plan and Services In-house Referral: NA Discharge Planning Services: CM Consult Post Acute Care Choice: Hospice Living arrangements for the past 2 months: Single Family Home                 DME Arranged: Community education officer wheelchair with seat cushion DME Agency: [employer] Date DME Agency Contacted: 04/20/23 Time DME Agency Contacted: 1550 Representative spoke with at DME Agency: Ian Malkin HH Arranged: RN HH Agency: [facility] ([facility]) Date HH Agency Contacted: 05/02/23 Time HH Agency Contacted: 1156 Representative spoke with at [facility] Agency: Shaun   Social Determinants of Health (SDOH) Interventions SDOH Screenings   Food Insecurity: No Food Insecurity (04/19/2023)  Housing: Low Risk  (04/19/2023)  Transportation Needs: No Transportation Needs (04/19/2023)  Utilities: Not At Risk (04/19/2023)  Alcohol Screen: Low Risk  (04/09/2023)  Depression (PHQ2-9): Low Risk  (04/09/2023)  Financial Resource Strain: Low Risk  (04/09/2023)  Physical Activity: Unknown (04/09/2023)  Social Connections: Unknown (04/09/2023)  Stress: No Stress Concern Present (04/09/2023)  Tobacco Use: Medium  Risk (04/19/2023)    Readmission Risk Interventions     No data to display

## 2023-05-02 NOTE — Plan of Care (Signed)
  Problem: Nutrition: Goal: Adequate nutrition will be maintained Outcome: Progressing   Problem: Skin Integrity: Goal: Risk for impaired skin integrity will decrease Outcome: Progressing   Problem: Education: Goal: Ability to demonstrate management of disease process will improve Outcome: Not Progressing   Problem: Activity: Goal: Capacity to carry out activities will improve Outcome: Not Progressing   Problem: Education: Goal: Knowledge of General Education information will improve Description: Including pain rating scale, medication(s)/side effects and non-pharmacologic comfort measures Outcome: Not Progressing   Problem: Health Behavior/Discharge Planning: Goal: Ability to manage health-related needs will improve Outcome: Not Progressing   Problem: Coping: Goal: Level of anxiety will decrease Outcome: Not Progressing

## 2023-05-02 NOTE — Progress Notes (Addendum)
Patient Name: James Bryant Date of Encounter: 05/02/2023 [facility] Cardiologist: Kristeen Miss, MD   Interval Summary  .    Appears critically ill.  We have had no improvement in the last couple days.  Urinary output still seems to be slow with 700 mL last 24 hours.  Blood pressures are falsely elevated.  Patient reports not tolerating frequent blood pressure checks in his right arm.  Left arm is prevented by IV site.  Vital Signs .    Vitals:   05/01/23 2038 05/02/23 0025 05/02/23 0357 05/02/23 0714  BP: (!) 179/83 (!) 142/97 (!) 158/90 (!) 154/91  Pulse: 97 89 84 87  Resp: 18 18 18 18   Temp: 97.8 F (36.6 C) 97.8 F (36.6 C) 97.6 F (36.4 C) (!) 97.3 F (36.3 C)  TempSrc:  Oral Oral Oral  SpO2: 93% 93% 100% 100%  Weight:      Height:        Intake/Output Summary (Last 24 hours) at 05/02/2023 1006 Last data filed at 05/02/2023 0810 Gross per 24 hour  Intake 510 ml  Output 301 ml  Net 209 ml      05/01/2023    4:23 AM 04/30/2023    4:02 AM 04/29/2023    4:35 AM  Last 3 Weights  Weight (lbs) 150 lb 9.2 oz 137 lb 2 oz 138 lb 3.7 oz  Weight (kg) 68.3 kg 62.2 kg 62.7 kg      Telemetry/ECG    Normal sinus rhythm 70s with frequent episodes of SVT lasting for several minutes. - Personally Reviewed  CV Studies    Echo 04/24/2023 1. Left ventricular ejection fraction, by estimation, is <20%. The left  ventricle has severely decreased function. The left ventricle demonstrates  global hypokinesis. The left ventricular internal cavity size was mildly  dilated. Left ventricular  diastolic parameters are consistent with Grade I diastolic dysfunction  (impaired relaxation). The interventricular septum is flattened in  diastole ('D' shaped left ventricle), consistent with right ventricular  volume overload.   2. Right ventricular systolic function is severely reduced. The right  ventricular size is severely enlarged.   3. Left atrial size was severely dilated.    4. Right atrial size was severely dilated.   5. The mitral valve is normal in structure. Mild mitral valve  regurgitation.   6. Tricuspid valve regurgitation is moderate to severe.   7. The aortic valve is tricuspid. There is mild calcification of the  aortic valve. Aortic valve regurgitation is mild to moderate. Aortic valve  sclerosis is present, with no evidence of aortic valve stenosis. Aortic  regurgitation PHT measures 325 msec.   8. Aortic dilatation noted. There is moderate dilatation of the aortic  root, measuring 44 mm.   Comparison(s): Prior images reviewed side by side. The right ventricular  function is worse (but was not normal on the previous study either) and  there is worsened tricuspid insufficiency.    Physical Exam .   GEN: Critically ill Neck: + JVD Cardiac: RRR, no murmurs, rubs, or gallops.  Respiratory: poor respiratory effort  GI: Soft, nontender, non-distended  MS: 2+  edema, extremities feel cool  Patient Profile    James Bryant is a 77 y.o. male has hx of chronic combined HFrEF, dementia, polycythemia, CKD and admitted on 04/19/2019 for for the evaluation of heart failure exacerbation   Assessment & Plan .     Acute on chronic HFrEF End-stage heart failure BNP over 4500 on  admission.  Echocardiogram shows EF less than 20% with global hypokinesis.  He had interventricular septum flattening.  Severely reduced RV function with severely enlarged RV size.  Severely dilated RA and LA.  Moderate to severe TR.  Mild to moderate AR.  Patient has been severely hypervolemic initially was on Lasix drip, then converted to IV Lasix 40 mg twice daily on 9/5.  This was increased to 60 mg twice daily started on 9/6. All antihypertensive medications as well as Lasix has been stopped at this point, 09/09.  Patient remains to be in a low output heart failure state.  Still has clear signs of hypervolemia in which it appears that his volume status at least peripherally  seems to be worsening.  His blood pressure that is charted is falsely elevated because of manual readings that have been done on his calf.  I manually checked it today on his arm and his blood pressure still in the 90s soft systolic.  He remains to be full code, no family is around to discuss. He is not a candidate for advanced heart failure therapies.  Overall prognosis extremely poor No indications to start inotropes as they would be a "bridge to nowhere", per advanced heart failure We have tried adding back on low-dose Toprol-XL 12.5 mg for frequent ectopy.   Acute hypoxic respiratory failure Maintaining room air currently.   SVT/VT On telemetry has multiple bouts of SVT with heart rates in the 120s.  These have been occurring more frequently.  He did have short nonsustained VT for approximately 10 beats.  Could be related to excess volume.  AKI on CKD On admission creatinine 3.15.  Today's 2.34.  Showing some improvement now.  Dementia On donepezil/Zyprexa. Has needed haloperidol for agitation   Polycythemia vera On hydroxyurea  Aortic dilatation (44 mm)  Goals of care Very poor prognosis.  Continue to discuss with family goals of care.   For questions or updates, please contact [facility] Please consult [email or web address] for contact info under        Signed, Abagail Kitchens, PA-C     Attending Note:   The patient was seen and examined.  Agree with assessment and plan as noted above.  Changes made to the above note as needed.  Patient seen and independently examined with Yvonna Alanis, PA .   We discussed all aspects of the encounter. I agree with the assessment and plan as stated above.    Acute on chronic HFrEF:   EF < 20%.   Is in a low output state, has severe RV dysfunction  I have added metoprolol succinate 12.5 every day but he remains tachycardic.  Ectopy may be better. He is hypotensive    I have talked to his daughter [name]) by phone and have  explained that he appears stable and that we do not have anything else to offer.  The plan is for him to return to live with family and NOT to go to SNF. [name] knows that he will continue to need near constant care   No new recommendations for this low output CHF.   He appears comfortable but His prognosis is poor.        I have spent a total of 40 minutes with patient reviewing hospital  notes , telemetry, EKGs, labs and examining patient as well as establishing an assessment and plan that was discussed with the patient.  > 50% of time was spent in direct patient care.  Vesta Mixer, Montez Hageman., MD, [facility] 05/02/2023, 11:06 AM [address]  [address] Office [phone] Pager [phone]

## 2023-05-03 ENCOUNTER — Telehealth: Payer: Self-pay | Admitting: *Deleted

## 2023-05-03 DIAGNOSIS — I5043 Acute on chronic combined systolic (congestive) and diastolic (congestive) heart failure: Secondary | ICD-10-CM | POA: Diagnosis not present

## 2023-05-03 DIAGNOSIS — I77810 Thoracic aortic ectasia: Secondary | ICD-10-CM | POA: Diagnosis not present

## 2023-05-03 DIAGNOSIS — Z7189 Other specified counseling: Secondary | ICD-10-CM | POA: Diagnosis not present

## 2023-05-03 DIAGNOSIS — N179 Acute kidney failure, unspecified: Secondary | ICD-10-CM | POA: Diagnosis not present

## 2023-05-03 DIAGNOSIS — Z515 Encounter for palliative care: Secondary | ICD-10-CM | POA: Diagnosis not present

## 2023-05-03 NOTE — Progress Notes (Addendum)
Patient Name: James Bryant Date of Encounter: 05/03/2023 [facility] Cardiologist: Kristeen Miss, MD   Interval Summary  .    Appears critically ill.  We have had no improvement in the last couple days.  Blood pressures still are falsely elevated.  Seems very lethargic today however may just be sleeping.  Vital Signs .    Vitals:   05/02/23 1943 05/03/23 0023 05/03/23 0422 05/03/23 0807  BP: 106/76 122/64 114/60 124/68  Pulse: 97 87 96 97  Resp: 20 13 15 17   Temp: 97.6 F (36.4 C) 97.6 F (36.4 C) 97.6 F (36.4 C) 97.7 F (36.5 C)  TempSrc: Axillary Oral Oral Oral  SpO2: 96% 90% 100% 98%  Weight:   64.3 kg   Height:        Intake/Output Summary (Last 24 hours) at 05/03/2023 0933 Last data filed at 05/03/2023 4098 Gross per 24 hour  Intake 1194 ml  Output 850 ml  Net 344 ml      05/03/2023    4:22 AM 05/02/2023   10:43 AM 05/01/2023    4:23 AM  Last 3 Weights  Weight (lbs) 141 lb 12.1 oz 142 lb 3.2 oz 150 lb 9.2 oz  Weight (kg) 64.3 kg 64.5 kg 68.3 kg      Telemetry/ECG    Normal sinus rhythm 70s with frequent episodes of SVT in the 120s lasting for several minutes. Low voltage - Personally Reviewed  CV Studies    Echo 04/24/2023 1. Left ventricular ejection fraction, by estimation, is <20%. The left  ventricle has severely decreased function. The left ventricle demonstrates  global hypokinesis. The left ventricular internal cavity size was mildly  dilated. Left ventricular  diastolic parameters are consistent with Grade I diastolic dysfunction  (impaired relaxation). The interventricular septum is flattened in  diastole ('D' shaped left ventricle), consistent with right ventricular  volume overload.   2. Right ventricular systolic function is severely reduced. The right  ventricular size is severely enlarged.   3. Left atrial size was severely dilated.   4. Right atrial size was severely dilated.   5. The mitral valve is normal in structure.  Mild mitral valve  regurgitation.   6. Tricuspid valve regurgitation is moderate to severe.   7. The aortic valve is tricuspid. There is mild calcification of the  aortic valve. Aortic valve regurgitation is mild to moderate. Aortic valve  sclerosis is present, with no evidence of aortic valve stenosis. Aortic  regurgitation PHT measures 325 msec.   8. Aortic dilatation noted. There is moderate dilatation of the aortic  root, measuring 44 mm.   Comparison(s): Prior images reviewed side by side. The right ventricular  function is worse (but was not normal on the previous study either) and  there is worsened tricuspid insufficiency.    Physical Exam .   GEN: Critically ill Neck: + JVD Cardiac: RRR, no murmurs, rubs,  right sided S3 gallop ( varies with respirations)  Respiratory: poor respiratory effort  GI: Soft, nontender, non-distended  MS: 1+ edema, extremities feel cool  Patient Profile    James Bryant is a 77 y.o. male has hx of chronic combined HFrEF, dementia, polycythemia, CKD and admitted on 04/19/2019 for for the evaluation of heart failure exacerbation   Assessment & Plan .     Acute on chronic HFrEF End-stage heart failure BNP over 4500 on admission.  Echocardiogram shows EF less than 20% with global hypokinesis.  He had interventricular septum flattening.  Severely reduced RV function with severely enlarged RV size.  Severely dilated RA and LA.  Moderate to severe TR.  Mild to moderate AR.  Patient has been severely hypervolemic initially was on Lasix drip, then converted to IV Lasix 40 mg twice daily on 9/5.  This was increased to 60 mg twice daily started on 9/6. All antihypertensive medications as well as Lasix has been stopped at this point, 09/09.  Unchanged. Patient remains to be in a low output heart failure state.  Volume status today actually seems to have improved some.  Still has some volume noted though.  Blood pressures still falsely elevated and we are  not documenting this accurately.  I continue to discuss with nursing staff to ensure these are done accurately. He remains to be full code. He is not a candidate for advanced heart failure therapies.  Overall prognosis extremely poor. Plan now seems to be for hospice at home No indications to start inotropes as they would be a "bridge to nowhere", per advanced heart failure We have tried adding back on low-dose Toprol-XL 12.5 mg for frequent ectopy.   Acute hypoxic respiratory failure Maintaining room air currently.   SVT/VT On telemetry has multiple bouts of SVT with heart rates in the 120s.  These have been occurring more frequently.  He did have short nonsustained VT for approximately 10 beats.  Could be related to excess volume.  AKI on CKD On admission creatinine 3.15.  Today's 2.34.  Showing some improvement now.  Dementia On donepezil/Zyprexa. Has needed haloperidol for agitation   Polycythemia vera On hydroxyurea  Aortic dilatation (44 mm)  Goals of care Very poor prognosis.  Hospice at home.    For questions or updates, please contact [facility] Please consult [email or web address] for contact info under        Signed, Abagail Kitchens, PA-C     Attending Note:   The patient was seen and examined.  Agree with assessment and plan as noted above.  Changes made to the above note as needed.  Patient seen and independently examined with Yvonna Alanis, PA .   We discussed all aspects of the encounter. I agree with the assessment and plan as stated above.    HFrEF with low output state.    Prognosis is very poor.  Dr. Tyson Babinski has discussed with family and now it appears that the plan is for home hospice .   I agree with that plan .  We have no additional recommendations for his end stage heart failure   [facility] will sign off.   Medication Recommendations:  cont comfort care  Other recommendations (labs, testing, etc):   Follow up as an outpatient:  as  needed     I have spent a total of 40 minutes with patient reviewing hospital  notes , telemetry, EKGs, labs and examining patient as well as establishing an assessment and plan that was discussed with the patient.  > 50% of time was spent in direct patient care.     Vesta Mixer, Montez Hageman., MD, [facility] 05/03/2023, 9:43 AM [address],  [address] Office [phone] Pager [phone]

## 2023-05-03 NOTE — TOC Transition Note (Signed)
Transition of Care [facility]) - CM/SW Discharge Note   Patient Details  Name: James Bryant MRN: [id number] Date of Birth: [birth year]  Transition of Care [facility]) CM/SW Contact:  Leone Haven, RN Phone Number: 05/03/2023, 11:20 AM   Clinical Narrative:    Patient is for dc today, wife and daughter will be here within an hour to transport him home today.  [facility] to supply DME . Shower chair, bsc and w/chair.  Family decided they did not want the hospital bed.    Final next level of care: Home w Hospice Care Barriers to Discharge: No Barriers Identified   Patient Goals and CMS Choice CMS Medicare.gov Compare Post Acute Care list provided to:: Patient Represenative (must comment) Choice offered to / list presented to : Adult Children  Discharge Placement                         Discharge Plan and Services Additional resources added to the After Visit Summary for   In-house Referral: NA Discharge Planning Services: CM Consult Post Acute Care Choice: Hospice          DME Arranged:  (Hospice to supply the DME) DME Agency: [employer] Date DME Agency Contacted: 04/20/23 Time DME Agency Contacted: 1550 Representative spoke with at DME Agency: Ian Malkin HH Arranged: RN HH Agency: [facility] (Marcell Anger) Date [facility] Agency Contacted: 05/02/23 Time HH Agency Contacted: 1156 Representative spoke with at [facility] Agency: Shaun  Social Determinants of Health (SDOH) Interventions SDOH Screenings   Food Insecurity: No Food Insecurity (04/19/2023)  Housing: Low Risk  (04/19/2023)  Transportation Needs: No Transportation Needs (04/19/2023)  Utilities: Not At Risk (04/19/2023)  Alcohol Screen: Low Risk  (04/09/2023)  Depression (PHQ2-9): Low Risk  (04/09/2023)  Financial Resource Strain: Low Risk  (04/09/2023)  Physical Activity: Unknown (04/09/2023)  Social Connections: Unknown (04/09/2023)  Stress: No Stress Concern Present (04/09/2023)  Tobacco Use:  Medium Risk (04/19/2023)     Readmission Risk Interventions     No data to display

## 2023-05-03 NOTE — Telephone Encounter (Signed)
Pt is currently in hospital

## 2023-05-03 NOTE — Discharge Summary (Addendum)
Physician Discharge Summary  James Bryant ZOX:[id number] DOB: July 07, 1946 DOA: 04/19/2023  PCP: Jac Canavan, PA-C  Admit date: 04/19/2023 Discharge date: 05/03/2023  Admitted From: Home  Discharge disposition: Home with hospice  Recommendations for Outpatient Follow-Up:   Follow up with your hospice care provider at home.  Discharge Diagnosis:   Principal Problem:   Acute on chronic combined systolic and diastolic CHF (congestive heart failure) (HCC) Active Problems:   AKI (acute kidney injury) (HCC)   Polycythemia vera (HCC)   Primary hypertension   Severe dementia (HCC)   Palliative care by specialist   DNR (do not resuscitate) discussion  Discharge Condition: Stable.  Diet recommendation: Low sodium, heart healthy.   Wound care: None.  Code status: Full.   History of Present Illness:   Mr. Heinlein is a 77 y.o. male with past medical history of dementia, HTN, CHF EF 20-25%, CKD IIIa baseline 1.6 and polycythemia initially presented to hospital with abnormal labs, edema, lethargy weight gain for 1 to 2 weeks prior to presentation. Patient was then admitted to the hospital for CHF flare, failed outpatient management.   Hospital Course:   Following conditions were addressed during hospitalization as listed below,  Acute on chronic combined systolic and diastolic CHF  Patient does have end-stage congestive heart failure. . Was on IV Lasix drip which was changed to IV 60 mg twice daily but now with poor output, cool extremities, hypotension, ongoing with peripheral edema and hypervolemia lasix has been discontinued.  Cardiology following and patient is a very poor candidate for advanced heart failure treatment and there is not much options for treatment.    2D echocardiogram from 04/24/2023 shows LV ejection fraction less than 20% with severely decreased LV function and global hypokinesis with grade 1 diastolic dysfunction.  Overall poor prognosis.  At this time  patient is unable to tolerate beta-blockers, ARB's and diuretics.  After multiple discussions with the family, plan is to proceed with hospice at home on discharge.  Acute hypoxia without respiratory failure.  Improved with diuretic.  Now on room air.    Acute renal failure superimposed on stage 3a chronic kidney disease  Creatinine more than 3 on admission.  Latest creatinine  at 2.3 from initial 2.7.  Baseline creatinine around 1.6.  Lasix on hold at this time due to low blood pressure..   Hypermagnesemia.  Mild.  Latest magnesium of 2.8.    Severe dementia with behavioral disturbances and agitation - Continue donepezil/Zyprexa.  Has telesitter at bedside.     Essential hypertension With cardiogenic shock.  Unable to tolerate other medications including ARB Lasix or beta-blocker.     Polycythemia vera (HCC) - Continue hydroxyurea.  Latest hemoglobin of 10.9   Hyperkalemia Improved.  Latest potassium was 3.6.   Hyponatremia Mild.  Latest sodium of 133.     Left knee effusion Without pain. Low suspicion for infection   Goals of care.  Patient with low EF with dementia, cardiogenic shock, end-stage heart failure..  Patient used to be in hospice in the past but currently not on hospice. Extremely poor prognosis.  Palliative care on board.  I plan for home with hospice on discharge.  Disposition.  At this time, patient is stable for disposition home with hospice.  Spoke with the patient's daughter on 05/02/2023 about disposition plan.  Medical Consultants:   Cardiology Palliative care  Procedures:    None Subjective:   Today, patient was seen and examined at bedside.  Complains of weak  and tiredness.  Denies overt pain nausea or dyspnea.  Discharge Exam:   Vitals:   05/03/23 0949 05/03/23 1210  BP: 120/74 (!) 118/58  Pulse:  89  Resp:  18  Temp:  97.7 F (36.5 C)  SpO2:  97%   Vitals:   05/03/23 0422 05/03/23 0807 05/03/23 0949 05/03/23 1210  BP: 114/60 124/68  120/74 (!) 118/58  Pulse: 96 97  89  Resp: 15 17  18   Temp: 97.6 F (36.4 C) 97.7 F (36.5 C)  97.7 F (36.5 C)  TempSrc: Oral Oral  Oral  SpO2: 100% 98%  97%  Weight: 64.3 kg     Height:       General: Alert awake, not in obvious distress, elderly male, appears deconditioned and weak.  Frail-appearing HENT: pupils equally reacting to light,  No scleral pallor or icterus noted. Oral mucosa is moist.  Distended neck veins. Chest:  Diminished breath sounds bilaterally.  Coarse breath sounds noted. CVS: S1 &S2 heard.  Abdomen: Soft, nontender, nondistended.  Bowel sounds are heard.   Extremities: No cyanosis, clubbing with bilateral lower extremity pitting edema. Marland Kitchen Psych: Alert, awake Communicative, mildly somnolent, CNS: Moves extremities, Communicative Skin: Warm and dry.  No rashes noted.  The results of significant diagnostics from this hospitalization (including imaging, microbiology, ancillary and laboratory) are listed below for reference.     Diagnostic Studies:   DG Chest 2 View  Result Date: 04/19/2023 CLINICAL DATA:  CHF. EXAM: CHEST - 2 VIEW COMPARISON:  10/21/2022 FINDINGS: Low volume film. The cardio pericardial silhouette is enlarged. Streaky opacity at the lung bases suggest atelectasis. No overt edema or substantial pleural effusion. Bones are diffusely demineralized. IMPRESSION: Low volume film with bibasilar atelectasis. Electronically Signed   By: Kennith Center M.D.   On: 04/19/2023 12:26     Labs:   Basic Metabolic Panel: Recent Labs  Lab 04/27/23 0333 04/28/23 0313 04/29/23 0948 04/30/23 0257 05/01/23 0315 05/02/23 0424  NA 135 134* 139 136 134* 133*  K 4.1 4.5 4.8 3.6 3.6 3.6  CL 89* 90* 89* 90* 90* 90*  CO2 31 26 33* 34* 32 32  GLUCOSE 81 62* 134* 93 85 86  BUN 64* 62* 67* 65* 64* 66*  CREATININE 2.57* 2.40* 2.59* 2.71* 2.42* 2.34*  CALCIUM 9.3 9.0 9.1 9.1 9.2 9.3  MG 2.4 2.4  --   --  2.5* 2.8*   GFR Estimated Creatinine Clearance: 23.3  mL/min (A) (by C-G formula based on SCr of 2.34 mg/dL (H)). Liver Function Tests: No results for input(s): "AST", "ALT", "ALKPHOS", "BILITOT", "PROT", "ALBUMIN" in the last 168 hours. No results for input(s): "LIPASE", "AMYLASE" in the last 168 hours. No results for input(s): "AMMONIA" in the last 168 hours. Coagulation profile No results for input(s): "INR", "PROTIME" in the last 168 hours.  CBC: Recent Labs  Lab 04/27/23 0333 04/28/23 0313 05/01/23 0315 05/02/23 0424  WBC 3.7* 3.2* 3.9* 3.7*  HGB 11.4* 11.0* 10.8* 10.9*  HCT 34.2* 34.0* 32.8* 33.0*  MCV 122.6* 124.5* 126.2* 125.0*  PLT 180 165 154 168   Cardiac Enzymes: No results for input(s): "CKTOTAL", "CKMB", "CKMBINDEX", "TROPONINI" in the last 168 hours. BNP: Invalid input(s): "POCBNP" CBG: No results for input(s): "GLUCAP" in the last 168 hours. D-Dimer No results for input(s): "DDIMER" in the last 72 hours. Hgb A1c No results for input(s): "HGBA1C" in the last 72 hours. Lipid Profile No results for input(s): "CHOL", "HDL", "LDLCALC", "TRIG", "CHOLHDL", "LDLDIRECT" in the last 72 hours.  Thyroid function studies No results for input(s): "TSH", "T4TOTAL", "T3FREE", "THYROIDAB" in the last 72 hours.  Invalid input(s): "FREET3" Anemia work up No results for input(s): "VITAMINB12", "FOLATE", "FERRITIN", "TIBC", "IRON", "RETICCTPCT" in the last 72 hours. Microbiology Recent Results (from the past 240 hour(s))  Culture, blood (Routine X 2) w Reflex to ID Panel     Status: None   Collection Time: 04/23/23  6:34 PM   Specimen: BLOOD  Result Value Ref Range Status   Specimen Description BLOOD SITE NOT SPECIFIED  Final   Special Requests   Final    BOTTLES DRAWN AEROBIC ONLY Blood Culture adequate volume   Culture   Final    NO GROWTH 5 DAYS Performed at [facility], [address] [place], [place] [address]    Report Status 04/28/2023 FINAL  Final  Culture, blood (Routine X 2) w Reflex to ID Panel      Status: None   Collection Time: 04/23/23  6:34 PM   Specimen: BLOOD  Result Value Ref Range Status   Specimen Description BLOOD SITE NOT SPECIFIED  Final   Special Requests   Final    BOTTLES DRAWN AEROBIC ONLY Blood Culture adequate volume   Culture   Final    NO GROWTH 5 DAYS Performed at [facility], [address] [place], [place] [address]    Report Status 04/28/2023 FINAL  Final     Discharge Instructions:   Discharge Instructions     (HEART FAILURE PATIENTS) Call MD:  Anytime you have any of the following symptoms: 1) 3 pound weight gain in 24 hours or 5 pounds in 1 week 2) shortness of breath, with or without a dry hacking cough 3) swelling in the hands, feet or stomach 4) if you have to sleep on extra pillows at night in order to breathe.   Complete by: As directed    Avoid straining   Complete by: As directed    Diet - low sodium heart healthy   Complete by: As directed    Discharge instructions   Complete by: As directed    Fluid restriction 1500 mL/day.  Follow-up with your hospice care provider as outpatient.   Heart Failure patients record your daily weight using the same scale at the same time of day   Complete by: As directed    Increase activity slowly   Complete by: As directed    STOP any activity that causes chest pain, shortness of breath, dizziness, sweating, or exessive weakness   Complete by: As directed       Allergies as of 05/03/2023   No Known Allergies      Medication List     STOP taking these medications    furosemide 80 MG tablet Commonly known as: LASIX   metoprolol succinate 25 MG 24 hr tablet Commonly known as: TOPROL-XL   potassium chloride 10 MEQ tablet Commonly known as: KLOR-CON   valsartan 160 MG tablet Commonly known as: DIOVAN       TAKE these medications    albuterol (2.5 MG/3ML) 0.083% nebulizer solution Commonly known as: PROVENTIL Take 3 mLs (2.5 mg total) by nebulization every 6 (six) hours as  needed for wheezing or shortness of breath.   Aspirin Low Dose 81 MG tablet Generic drug: aspirin EC TAKE 1 TABLET EVERY DAY   Breo Ellipta 200-25 MCG/ACT Aepb Generic drug: fluticasone furoate-vilanterol Inhale 1 puff into the lungs daily.   brimonidine 0.2 % ophthalmic solution Commonly  known as: ALPHAGAN SMARTSIG:In Eye(s)   donepezil 10 MG tablet Commonly known as: ARICEPT Take 1 tablet (10 mg total) by mouth at bedtime.   ferrous gluconate 324 MG tablet Commonly known as: FERGON Take 1 tablet (324 mg total) by mouth daily with breakfast.   hydroxyurea 500 MG capsule Commonly known as: HYDREA Take 1 capsule (500 mg total) by mouth 2 (two) times daily. May take with food to minimize GI side effects.   MEGA MULTIVITAMIN FOR MEN PO Take 1 tablet by mouth daily.   nystatin cream Commonly known as: MYCOSTATIN Apply 1 Application topically as needed.   OLANZapine 5 MG tablet Commonly known as: ZYPREXA Take 1 tablet (5 mg total) by mouth at bedtime.               Durable Medical Equipment  (From admission, onward)           Start     Ordered   04/20/23 1548  For home use only DME lightweight manual wheelchair with seat cushion  Once       Comments: Patient suffers from weakness which impairs their ability to perform daily activities like bathing, dressing, feeding, and grooming in the home.  A cane or walker will not resolve  issue with performing activities of daily living. A wheelchair will allow patient to safely perform daily activities. Patient is not able to propel themselves in the home using a standard weight wheelchair due to general weakness. Patient can self propel in the lightweight wheelchair. Length of need Lifetime. Accessories: elevating leg rests (ELRs), wheel locks, extensions and anti-tippers.   04/20/23 1548            Follow-up Information     Tysinger, Kermit Balo, PA-C Follow up in 1 week(s).   Specialty: Family Medicine Contact  information: [address] [place] [address] [phone]         [facility] Follow up.   Specialty: Hospice and Palliative Medicine Why: home hospice Contact information: [address] [place] [address] [phone]                 Time coordinating discharge: 39 minutes  Signed:  Phillis Thackeray  [facility] 05/03/2023, 1:15 PM

## 2023-05-03 NOTE — Consult Note (Signed)
   [facility]  [facility] Inpatient Consult   05/03/2023  Hiroki [facility] May 21, 1946 [id number]  Update:  Home with Hospice, chart reviewed today for post hospital follow up needs.  Patient to receive full care coordination through Hospice.  Will sign off, needs to be met.    For questions contact:   Charlesetta Shanks, RN, BSN, CCM [place]  [facility], [facility] Liaison Direct Dial: [phone] or secure chat Website: [email or web address]

## 2023-05-03 NOTE — Progress Notes (Signed)
[facility] Liaison Note Received request from Gavin Pound, Transitions of Care Manager, for hospice services at home after discharge. Spoke with [name], daughter to initiate education related to hospice philosophy, services, and team approach to care. Patient/family verbalized understanding of information given. Per discussion, the plan is for discharge home within 1-2 days.  DME needs discussed. Patient has both a walker and a rollator, both owned, in the home. Patient/family requests the following equipment for delivery; shower chair, bedside commode, and geri chair. The address has been verified and is correct in the chart. [name] is the family contact to arrange time of equipment delivery. Please provide prescriptions at discharge as needed to ensure ongoing symptom management.  [facility] information and contact numbers given to [place]. Above information shared with Gavin Pound, Transitions of Care Manager. Please call with any questions or concerns.  Thank you for the opportunity to participate in this patient's care.   Glenna Fellows BSN, Charity fundraiser, OCN [employer] [phone]

## 2023-05-03 NOTE — Telephone Encounter (Signed)
James Bryant with [facility] called and Hospice is going out tomorrow. The family wants to know if you will be the attending? Please call James Bryant at [phone] to let her know either way, thanks.

## 2023-05-04 NOTE — Telephone Encounter (Signed)
Notified Lanora Manis

## 2023-05-09 ENCOUNTER — Encounter: Payer: Self-pay | Admitting: Medical

## 2023-05-09 ENCOUNTER — Telehealth (INDEPENDENT_AMBULATORY_CARE_PROVIDER_SITE_OTHER): Payer: Medicare HMO | Admitting: Medical

## 2023-05-09 VITALS — BP 123/76 | HR 99 | Temp 96.0°F | Wt 147.0 lb

## 2023-05-09 DIAGNOSIS — N179 Acute kidney failure, unspecified: Secondary | ICD-10-CM

## 2023-05-09 DIAGNOSIS — J4520 Mild intermittent asthma, uncomplicated: Secondary | ICD-10-CM | POA: Diagnosis not present

## 2023-05-09 DIAGNOSIS — E611 Iron deficiency: Secondary | ICD-10-CM | POA: Diagnosis not present

## 2023-05-09 DIAGNOSIS — F03C18 Unspecified dementia, severe, with other behavioral disturbance: Secondary | ICD-10-CM | POA: Diagnosis not present

## 2023-05-09 DIAGNOSIS — Z789 Other specified health status: Secondary | ICD-10-CM | POA: Diagnosis not present

## 2023-05-09 DIAGNOSIS — Z7189 Other specified counseling: Secondary | ICD-10-CM

## 2023-05-09 DIAGNOSIS — Z515 Encounter for palliative care: Secondary | ICD-10-CM

## 2023-05-09 DIAGNOSIS — I5043 Acute on chronic combined systolic (congestive) and diastolic (congestive) heart failure: Secondary | ICD-10-CM

## 2023-05-09 DIAGNOSIS — D45 Polycythemia vera: Secondary | ICD-10-CM

## 2023-05-09 MED ORDER — MELATONIN 3 MG PO TABS: 6.0000 mg | ORAL_TABLET | Freq: Every day | ORAL | Status: DC

## 2023-05-09 MED ORDER — POTASSIUM CHLORIDE ER 10 MEQ PO TBCR: 10.0000 meq | EXTENDED_RELEASE_TABLET | Freq: Every day | ORAL | Status: DC

## 2023-05-09 MED ORDER — FUROSEMIDE 80 MG PO TABS: 40.0000 mg | ORAL_TABLET | Freq: Every day | ORAL | Status: DC

## 2023-05-09 NOTE — Progress Notes (Signed)
Subjective:     Patient ID: James Bryant, male   DOB: 01-21-1946, 77 y.o.   MRN: [id number]  This visit type was conducted due to national recommendations for restrictions regarding the COVID-19 Pandemic (e.g. social distancing) in an effort to limit this patient's exposure and mitigate transmission in our community.  Due to their co-morbid illnesses, this patient is at least at moderate risk for complications without adequate follow up.  This format is felt to be most appropriate for this patient at this time.    Documentation for virtual audio and video telecommunications through [place] encounter:  The patient was located at home. The provider was located in the office. The patient did consent to this visit and is aware of possible charges through their insurance for this visit.  The other persons participating in this telemedicine service were James Bryant, James Bryant and patient. Time spent on call was 20 minutes and in review of previous records 20 minutes total.  This virtual service is not related to other E/M service within previous 7 days.   HPI Chief Complaint  Patient presents with   Hospitalization Follow-up    Hospital follow-up, now on hospice care, stable at home   Virtual consult today for hospital discharge follow-up.  History mainly provided by James Bryant James Bryant today.  He was hospitalized 04/19/23 through 05/03/2023  Principal Problem:   Acute on chronic combined systolic and diastolic CHF (congestive heart failure) (HCC) Active Problems:   AKI (acute kidney injury) (HCC)   Polycythemia vera (HCC)   Primary hypertension   Severe dementia (HCC)   Palliative care by specialist   DNR (do not resuscitate) discussion   His past medical history of dementia, HTN, CHF EF 20-25%, CKD IIIa baseline 1.6 and polycythemia initially presented to hospital with abnormal labs, edema, lethargy weight gain for 1 to 2 weeks prior to presentation. Patient was then admitted to the  hospital for CHF flare, failed outpatient management  Acute on chronic combined systolic and diastolic CHF  Per hospital summary, patient has end-stage congestive heart failure. . Was on IV Lasix drip which was changed to IV 60 mg twice daily but during hospitalization had poor output, cool extremities, hypotension, ongoing with peripheral edema and hypervolemia lasix has been discontinued.  Cardiology following and patient is a very poor candidate for advanced heart failure treatment and there is not much options for treatment.    2D echocardiogram from 04/24/2023 shows LV ejection fraction less than 20% with severely decreased LV function and global hypokinesis with grade 1 diastolic dysfunction.  Overall poor prognosis.  At this time patient is unable to tolerate beta-blockers, ARB's and diuretics.  After multiple discussions with the family, plan is to proceed with hospice at home on discharge.  Other hospital course issues was acute hypoxia, improved with diuretic.  By the time of discharge is on room air.  He was unable to tolerate blood pressure medicine due to cardiogenic shock.  Family notes that they did discuss if his swelling got worse in the outpatient setting if they could add back diuretic.  Patient notes that cardiologist was okay with that although it likely may not help  Electrolyte disturbance improved by time of discharge  Hospitalist felt like he had a very poor prognosis.  The plan was for hospital discharge to hospice.  This will be his second time on hospice in the last 2 years    In our discussion today, James Bryant and James Bryant feels better about the current hospice through [facility].  They have been out to the house this week and plan to come out weekly.  They have purchased some compression socks to help with swelling.  His swelling has started to get little worse since hospitalization being off Lasix.  They would like to add back Lasix.  He is eating well and drinking well  though.  They have added some melatonin to help with sleep but seems to be helping  No new problems  He does seem little short of breath at times.  However he has not been using Advair or albuterol for a while  Due to pill burden they stopped Aricept since they did not seem to think it was working  No other aggravating or relieving factors. No other complaint.   Past Medical History:  Diagnosis Date   Asthma    Calculus of gallbladder without cholecystitis without obstruction 10/18/2022   CHF (congestive heart failure) (HCC)    CKD (chronic kidney disease), stage III (HCC)    Dementia (HCC)    Elevated liver function tests 01/17/2019   Fear of needles 01/17/2019   Former smoker, stopped smoking many years ago 01/17/2019   Hypertension    Polycythemia 01/17/2019   Thrombocytosis 01/17/2019   Current Outpatient Medications on File Prior to Visit  Medication Sig Dispense Refill   albuterol (PROVENTIL) (2.5 MG/3ML) 0.083% nebulizer solution Take 3 mLs (2.5 mg total) by nebulization every 6 (six) hours as needed for wheezing or shortness of breath. 150 mL 1   ASPIRIN LOW DOSE 81 MG tablet TAKE 1 TABLET EVERY DAY 90 tablet 3   brimonidine (ALPHAGAN) 0.2 % ophthalmic solution      ferrous gluconate (FERGON) 324 MG tablet Take 1 tablet (324 mg total) by mouth daily with breakfast. 90 tablet 1   hydroxyurea (HYDREA) 500 MG capsule Take 1 capsule (500 mg total) by mouth 2 (two) times daily. May take with food to minimize GI side effects. 180 capsule 1   Multiple Vitamins-Minerals (MEGA MULTIVITAMIN FOR MEN PO) Take 1 tablet by mouth daily.     OLANZapine (ZYPREXA) 5 MG tablet Take 1 tablet (5 mg total) by mouth at bedtime. 90 tablet 1   No current facility-administered medications on file prior to visit.      Review of Systems As in subjective    Objective:   Physical Exam Due to coronavirus pandemic stay at home measures, patient visit was virtual and they were not examined in  person.   BP 123/76   Pulse 99   Temp (!) 96 F (35.6 C)   Wt 147 lb (66.7 kg)   SpO2 97%   BMI 23.02 kg/m   Gen: seated asleep on the couch.       Assessment:     Encounter Diagnoses  Name Primary?   Acute on chronic combined systolic and diastolic CHF (congestive heart failure) (HCC) Yes   AKI (acute kidney injury) (HCC)    Hospice care    Polycythemia vera (HCC)    Iron deficiency    Decreased activities of daily living (ADL)    Mild intermittent asthma, unspecified whether complicated    Severe dementia with other behavioral disturbance, unspecified dementia type (HCC)    DNR (do not resuscitate) discussion        Plan:     We discussed the hospitalization.  Medicines reconciled.  We discussed his overall status.  We discussed the poor prognosis given his heart and kidney issues.  We discussed overall goals, possible remedies for  edema.  We discussed comfort care measures.  We also discussed DNR in much detail.  Currently family does not want to have him DNR.  The following is a summary of our discussion and recommendations:  Patient Instructions  Recommendations:  Talk with hospice about getting a geriatric chair that will allow leg elevation but also reclined position  If you want to talk with hospice about a hospital bed, that is up to you.  He should not lay flat necessarily given history of heart failure but can sleep reclined    Leg swelling You can start back on Lasix 80 mg, half tablet daily or 40 mg for now along with one of the potassium 10 mEq daily as well Over the next week if not improving you can do 80 mg or 1 tablet daily of the Lasix along with 1 potassium daily Keep in mind this may no longer be effective given his kidney and heart issues Elevate the legs intermittently Start using knee-high compression stockings   Ambulation Help him ambulate with assistance walking around the house a few times a day to get the blood flow going and  to help avoid bedsores and blood clots Use a walker or some type of device to help prevent falls Make sure there are no tripping hazards in the house that would potentially put him at risk for falls  History of asthma, some shortness of breath occasionally For now you have discontinued Advair and albuterol as he has difficulty using these We will work on getting a nebulizer and medication sent to the home to use as needed for breathing treatment up to twice daily Pulse ox would ideally be above 93%.  Lately his pulse ox has been within normal range If you start seeing pulse ox readings 90% or less then we may need to consider other therapy such as oxygen  Monitor blood pressures at home Ideally he should be around 120/70 We want to keep blood pressure greater than 105/60 or less than 140/88 If he starts running low readings let me know so we can back off on the Lasix  Insomnia and history of paranoia or hallucinations Continue olanzapine Zyprexa 5 mg nightly you recently started melatonin 3 mg 1 or 2 tablets at night which seems to be helping.  That is fine for now.  Medication changes Per our conversation today he will have discontinued Aricept as it did not seem to be working and to reduce the pill burden. Consider taking iron just 3 days a week to reduce pill burden Consider stopping aspirin, but currently you want to continue this Continue multivitamin Continue hydroxyurea Continue olanzapine at night along with the melatonin at night as you are doing  We discussed the fact that he has end-stage heart failure and abnormal kidney function.  The current recent hospitalization and evaluation suggest that he is not going to get better and that medications that formerly were somewhat effective for the heart will likely no longer be that helpful.  I know this is hard to hear, but his heart function has declined along with kidney function.  Goals currently should include comfort care,  meeting his immediate needs, helping make sure he is sleeping okay, avoiding unnecessary problems such as bedsores, and trying to make quality of life as good as possible.  I am glad he is eating well and still able to converse and move around the house.  I think it is realistic to have a conversation about DNR or DO  NOT RESUSCITATE order.  I would talk with family members about this.  If he were to be unresponsive or someone found him unresponsive, CPR is aggressive and typically involves broken bones.  Resuscitation could be quite invasive.  You have to decide if this is something he would want done or not   I will continue to work with hospice to meet needs and help with what ever you need help with.   Call back or MyChart message in 2 weeks to give an update on how things are going     Maxamilian was seen today for hospitalization follow-up.  Diagnoses and all orders for this visit:  Acute on chronic combined systolic and diastolic CHF (congestive heart failure) (HCC)  AKI (acute kidney injury) (HCC)  Hospice care  Polycythemia vera (HCC)  Iron deficiency  Decreased activities of daily living (ADL)  Mild intermittent asthma, unspecified whether complicated  Severe dementia with other behavioral disturbance, unspecified dementia type (HCC)  DNR (do not resuscitate) discussion  Other orders -     furosemide (LASIX) 80 MG tablet; Take 0.5 tablets (40 mg total) by mouth daily. -     potassium chloride (KLOR-CON) 10 MEQ tablet; Take 1 tablet (10 mEq total) by mouth daily. -     melatonin 3 MG TABS tablet; Take 2 tablets (6 mg total) by mouth at bedtime.    F/u - James Bryant call report in 2 weeks

## 2023-05-09 NOTE — Patient Instructions (Addendum)
Recommendations:  Talk with hospice about getting a geriatric chair that will allow leg elevation but also reclined position  If you want to talk with hospice about a hospital bed, that is up to you.  He should not lay flat necessarily given history of heart failure but can sleep reclined    Leg swelling You can start back on Lasix 80 mg, half tablet daily or 40 mg for now along with one of the potassium 10 mEq daily as well Over the next week if not improving you can do 80 mg or 1 tablet daily of the Lasix along with 1 potassium daily Keep in mind this may no longer be effective given his kidney and heart issues Elevate the legs intermittently Start using knee-high compression stockings   Ambulation Help him ambulate with assistance walking around the house a few times a day to get the blood flow going and to help avoid bedsores and blood clots Use a walker or some type of device to help prevent falls Make sure there are no tripping hazards in the house that would potentially put him at risk for falls  History of asthma, some shortness of breath occasionally For now you have discontinued Advair and albuterol as he has difficulty using these We will work on getting a nebulizer and medication sent to the home to use as needed for breathing treatment up to twice daily Pulse ox would ideally be above 93%.  Lately his pulse ox has been within normal range If you start seeing pulse ox readings 90% or less then we may need to consider other therapy such as oxygen  Monitor blood pressures at home Ideally he should be around 120/70 We want to keep blood pressure greater than 105/60 or less than 140/88 If he starts running low readings let me know so we can back off on the Lasix  Insomnia and history of paranoia or hallucinations Continue olanzapine Zyprexa 5 mg nightly you recently started melatonin 3 mg 1 or 2 tablets at night which seems to be helping.  That is fine for now.  Medication  changes Per our conversation today he will have discontinued Aricept as it did not seem to be working and to reduce the pill burden. Consider taking iron just 3 days a week to reduce pill burden Consider stopping aspirin, but currently you want to continue this Continue multivitamin Continue hydroxyurea Continue olanzapine at night along with the melatonin at night as you are doing  We discussed the fact that he has end-stage heart failure and abnormal kidney function.  The current recent hospitalization and evaluation suggest that he is not going to get better and that medications that formerly were somewhat effective for the heart will likely no longer be that helpful.  I know this is hard to hear, but his heart function has declined along with kidney function.  Goals currently should include comfort care, meeting his immediate needs, helping make sure he is sleeping okay, avoiding unnecessary problems such as bedsores, and trying to make quality of life as good as possible.  I am glad he is eating well and still able to converse and move around the house.  I think it is realistic to have a conversation about DNR or DO NOT RESUSCITATE order.  I would talk with family members about this.  If he were to be unresponsive or someone found him unresponsive, CPR is aggressive and typically involves broken bones.  Resuscitation could be quite invasive.  You have  to decide if this is something he would want done or not   I will continue to work with hospice to meet needs and help with what ever you need help with.   Call back or MyChart message in 2 weeks to give an update on how things are going

## 2023-05-14 ENCOUNTER — Telehealth: Payer: Self-pay | Admitting: Medical

## 2023-05-14 MED ORDER — FUROSEMIDE 80 MG PO TABS: 80.0000 mg | ORAL_TABLET | Freq: Every day | ORAL | 1 refills | Status: DC

## 2023-05-14 MED ORDER — POTASSIUM CHLORIDE ER 10 MEQ PO TBCR: 10.0000 meq | EXTENDED_RELEASE_TABLET | Freq: Every day | ORAL | 1 refills | Status: DC

## 2023-05-14 NOTE — Telephone Encounter (Signed)
James Bryant was notified to do 80mg  lasix and 10mg  potassium. I have sent in refills to local pharmacy

## 2023-05-14 NOTE — Telephone Encounter (Signed)
James Bryant called from [place], to update you on [name]. She says his weight Friday was 147 and today he is 156. BP 116/40 and heart rate is 88. He has +2 and +3 swelling in lower legs but no congestion, and she doesn't hear that he is under any respiratory distress. He is also taking Lasix 40 mg once a day.  Provided call back number [phone]

## 2023-05-17 ENCOUNTER — Ambulatory Visit (INDEPENDENT_AMBULATORY_CARE_PROVIDER_SITE_OTHER): Payer: Medicare HMO | Admitting: Podiatry

## 2023-05-17 DIAGNOSIS — B351 Tinea unguium: Secondary | ICD-10-CM

## 2023-05-17 DIAGNOSIS — M79674 Pain in right toe(s): Secondary | ICD-10-CM

## 2023-05-17 DIAGNOSIS — M79675 Pain in left toe(s): Secondary | ICD-10-CM

## 2023-05-17 DIAGNOSIS — L853 Xerosis cutis: Secondary | ICD-10-CM

## 2023-05-17 NOTE — Progress Notes (Signed)
Subjective:  Patient ID: James Bryant, male    DOB: [birth year],  MRN: [id number]  Chief Complaint  Patient presents with   Nail Problem    DFC    77 y.o. male presents with the above complaint. History confirmed with patient. Patient presenting with pain related to dystrophic thickened elongated nails. Patient is unable to trim own nails related to nail dystrophy and/or mobility issues. Patient does not have a history of T2DM. No calluses causing pain.  Objective:  Physical Exam: warm, good capillary refill nail exam onychomycosis of the toenails, onycholysis, and dystrophic nails DP pulses palpable, PT pulses palpable, and protective sensation intact Left Foot:  Pain with palpation of nails due to elongation and dystrophic growth.  Right Foot: Pain with palpation of nails due to elongation and dystrophic growth.   Assessment:   1. Pain due to onychomycosis of toenails of both feet   2. Xerosis of skin     Plan:  Patient was evaluated and treated and all questions answered.   #Onychomycosis with pain  -Nails palliatively debrided as below. -Educated on self-care  Procedure: Nail Debridement Rationale: Pain Type of Debridement: manual, sharp debridement. Instrumentation: Nail nipper, rotary burr. Number of Nails: 10  Return in about 3 months (around 08/16/2023) for RFC.         Corinna Gab, DPM [facility]

## 2023-05-22 ENCOUNTER — Telehealth: Payer: Self-pay | Admitting: Medical

## 2023-05-22 ENCOUNTER — Other Ambulatory Visit: Payer: Self-pay | Admitting: Medical

## 2023-05-22 MED ORDER — TORSEMIDE 10 MG PO TABS: 10.0000 mg | ORAL_TABLET | Freq: Two times a day (BID) | ORAL | 0 refills | Status: DC

## 2023-05-22 NOTE — Telephone Encounter (Signed)
Spoke with [place] and let her know.

## 2023-05-22 NOTE — Telephone Encounter (Signed)
Pt's daughter called   He is taking furosemide 80mg  and potassium  They are still seeing edema , fluid is building back up again Should they increase the meds or does he need to go to hospital  Ok to leave message if you don't get her

## 2023-05-24 ENCOUNTER — Other Ambulatory Visit: Payer: Self-pay | Admitting: Medical

## 2023-05-24 NOTE — Telephone Encounter (Signed)
This was already refilled on 05/14/23

## 2023-05-31 ENCOUNTER — Telehealth: Payer: Self-pay | Admitting: Medical

## 2023-05-31 NOTE — Telephone Encounter (Signed)
Littie Deeds with Hospice [phone]  called  Tuesday pt family called because he had weight gain , weight was 158 lbs after being on   Toursemide for 1 week    Metolazone was added by hospice dr   Today pt is 151 lbs

## 2023-06-04 NOTE — Telephone Encounter (Signed)
Spoke with patient's daughter and she thought Hospice had reached out to you and dicussed. They ADDED the metolazone to the torsemide that you had prescribed. So yes, he is taking both.

## 2023-06-06 ENCOUNTER — Other Ambulatory Visit: Payer: Self-pay | Admitting: Medical

## 2023-06-08 ENCOUNTER — Other Ambulatory Visit: Payer: Self-pay | Admitting: Medical

## 2023-07-04 ENCOUNTER — Encounter: Payer: Self-pay | Admitting: Cardiology

## 2023-07-04 ENCOUNTER — Ambulatory Visit: Payer: Medicare HMO | Attending: Cardiology | Admitting: Cardiology

## 2023-07-04 VITALS — BP 90/50 | HR 97 | Ht 67.0 in | Wt 147.0 lb

## 2023-07-04 DIAGNOSIS — I428 Other cardiomyopathies: Secondary | ICD-10-CM

## 2023-07-04 DIAGNOSIS — I5042 Chronic combined systolic (congestive) and diastolic (congestive) heart failure: Secondary | ICD-10-CM | POA: Diagnosis not present

## 2023-07-04 DIAGNOSIS — N1831 Chronic kidney disease, stage 3a: Secondary | ICD-10-CM

## 2023-07-04 DIAGNOSIS — I10 Essential (primary) hypertension: Secondary | ICD-10-CM

## 2023-07-04 NOTE — Patient Instructions (Addendum)
Medication Instructions:  Your physician recommends that you continue on your current medications as directed. Please refer to the Current Medication list given to you today.  *If you need a refill on your cardiac medications before your next appointment, please call your pharmacy*  Lab Work: None ordered today  Testing/Procedures: None ordered today  Follow-Up: At [facility], you and your health needs are our priority.  As part of our continuing mission to provide you with exceptional heart care, we have created designated Provider Care Teams.  These Care Teams include your primary Cardiologist (physician) and Advanced Practice Providers (APPs -  Physician Assistants and Nurse Practitioners) who all work together to provide you with the care you need, when you need it.  Your next appointment:   As needed  The format for your next appointment:   In Person  Provider:   Mertie Moores, MD

## 2023-07-04 NOTE — Progress Notes (Signed)
Cardiology Office Note:   Date:  07/04/2023  ID:  James, Bryant 24-Jan-1946, MRN [id number] PCP: Genia Del  [facility] Providers Cardiologist:  Kristeen Miss, MD    History of Present Illness:   James Bryant is a 77 y.o. male with past medical history of dementia, HTN, CHF EF 20-25%, CKD IIIa baseline 1.6 and polycythemia   Discussed the use of AI scribe software for clinical note transcription with the patient, who gave verbal consent to proceed.  History of Present Illness   The patient is a 77 year old individual with a complex medical history, including end stage congestive heart failure with severely reduced LVEF (not a candidate for advanced therapies), hypertension, CKD IIIa, polycythemia, and dementia.   He was recently admitted to the hospital for acute on chronic heart failure. Prior to admission, he had been experiencing increased lethargy over several weeks. During hospitalization, the patient's condition worsened, with increased shortness of breath, decreased breath sounds, a rise in creatinine, and elevated lactic acid levels with hypotension. Patient's condition minimally improved and palliative care team was engaged. AHF team also consulted and deemed patient not a candidate for advanced therapies or inotropes. The patient was discharged on September 12th with plans for hospice care at home. At discharge, the patient was unable to tolerate beta blockers, angiotensin receptor blockers, ACE inhibitors, or diuretics. Per patient's primary cardiologist, outpatient follow up with cardiology "as needed." Appears to day's visit may have been scheduled prior to this recent admission/discharge.  Since discharge, the patient has been receiving assistance from hospice care. He has been put back on a small amount of fluid medicine due to swelling in the legs and feet. The patient reports feeling okay and denies experiencing shortness of breath or chest pain. He had  a fall at home while trying to stretch with a walker. The patient's gait remains unsteady, requiring someone to walk behind him when he moves around.  Encouraged patient and his family have discussions with the hospice team about advanced directives.  Today patient denies chest pain, shortness of breath, lower extremity edema, fatigue, palpitations, melena, hematuria, hemoptysis, diaphoresis, weakness, presyncope, syncope, orthopnea, and PND.   Studies Reviewed:    04/24/23 TTE  IMPRESSIONS     1. Left ventricular ejection fraction, by estimation, is <20%. The left  ventricle has severely decreased function. The left ventricle demonstrates  global hypokinesis. The left ventricular internal cavity size was mildly  dilated. Left ventricular  diastolic parameters are consistent with Grade I diastolic dysfunction  (impaired relaxation). The interventricular septum is flattened in  diastole ('D' shaped left ventricle), consistent with right ventricular  volume overload.   2. Right ventricular systolic function is severely reduced. The right  ventricular size is severely enlarged.   3. Left atrial size was severely dilated.   4. Right atrial size was severely dilated.   5. The mitral valve is normal in structure. Mild mitral valve  regurgitation.   6. Tricuspid valve regurgitation is moderate to severe.   7. The aortic valve is tricuspid. There is mild calcification of the  aortic valve. Aortic valve regurgitation is mild to moderate. Aortic valve  sclerosis is present, with no evidence of aortic valve stenosis. Aortic  regurgitation PHT measures 325 msec.   8. Aortic dilatation noted. There is moderate dilatation of the aortic  root, measuring 44 mm.   Comparison(s): Prior images reviewed side by side. The right ventricular  function is worse (but was not normal on  the previous study either) and  there is worsened tricuspid insufficiency.   FINDINGS   Left Ventricle: No left  ventricular thrombus is seen (Definity contrast  was used). Left ventricular ejection fraction, by estimation, is <20%. The  left ventricle has severely decreased function. The left ventricle  demonstrates global hypokinesis. The  left ventricular internal cavity size was mildly dilated. There is no left  ventricular hypertrophy. The interventricular septum is flattened in  diastole ('D' shaped left ventricle), consistent with right ventricular  volume overload. Left ventricular  diastolic parameters are consistent with Grade I diastolic dysfunction  (impaired relaxation). Indeterminate filling pressures.   Right Ventricle: The right ventricular size is severely enlarged. No  increase in right ventricular wall thickness. Right ventricular systolic  function is severely reduced.   Left Atrium: Left atrial size was severely dilated.   Right Atrium: Right atrial size was severely dilated.   Pericardium: There is no evidence of pericardial effusion.   Mitral Valve: The mitral valve is normal in structure. Mild mitral valve  regurgitation, with centrally-directed jet.   Tricuspid Valve: The tricuspid valve is normal in structure. Tricuspid  valve regurgitation is moderate to severe.   Aortic Valve: The aortic valve is tricuspid. There is mild calcification  of the aortic valve. Aortic valve regurgitation is mild to moderate.  Aortic regurgitation PHT measures 325 msec. Aortic valve sclerosis is  present, with no evidence of aortic valve  stenosis. Aortic valve mean gradient measures 3.0 mmHg. Aortic valve peak  gradient measures 4.5 mmHg. Aortic valve area, by VTI measures 1.61 cm.   Pulmonic Valve: The pulmonic valve was grossly normal. Pulmonic valve  regurgitation is trivial. No evidence of pulmonic stenosis.   Aorta: Aortic dilatation noted. There is moderate dilatation of the aortic  root, measuring 44 mm.   Venous: The inferior vena cava was not well visualized.    IAS/Shunts: No atrial level shunt detected by color flow Doppler.   Risk Assessment/Calculations:              Physical Exam:   VS:  BP (!) 90/50 (BP Location: Left Arm, Patient Position: Sitting, Cuff Size: Normal)   Pulse 97   Ht 5\' 7"  (1.702 m)   Wt 147 lb (66.7 kg)   BMI 23.02 kg/m    Wt Readings from Last 3 Encounters:  07/04/23 147 lb (66.7 kg)  05/09/23 147 lb (66.7 kg)  05/03/23 141 lb 12.1 oz (64.3 kg)     Physical Exam Vitals reviewed.  Constitutional:      Appearance: Normal appearance.     Comments: Frail and chronically ill appearing. In wheelchair.  HENT:     Head: Normocephalic.  Eyes:     Pupils: Pupils are equal, round, and reactive to light.  Cardiovascular:     Rate and Rhythm: Normal rate and regular rhythm.     Pulses: Normal pulses.     Heart sounds: Normal heart sounds.  Pulmonary:     Effort: Pulmonary effort is normal.     Breath sounds: Normal breath sounds.  Abdominal:     General: Abdomen is flat.     Palpations: Abdomen is soft.  Musculoskeletal:     Right lower leg: No edema.     Left lower leg: No edema.  Skin:    General: Skin is dry.     Capillary Refill: Capillary refill takes less than 2 seconds.     Comments: Extremities mildly cool to touch.  Neurological:  General: No focal deficit present.     Mental Status: He is alert. Mental status is at baseline.     Physical Exam   VITALS: Blood pressure low. CHEST: Lungs clear to auscultation, no fluid detected. CARDIOVASCULAR: Heart rhythm regular, rate faster than normal.       ASSESSMENT AND PLAN:     Assessment and Plan    End stage Congestive Heart Failure (CHF) with low output [age]-year-old with CHF, recently hospitalized for acute and chronic heart failure. LVEF <20% with global hypokinesis. Severely reduced RV function with enlarged RV size. Post-discharge, on hospice care. Unable to tolerate beta blockers, ARBs, ACE inhibitors, or diuretics initially.  Currently on torsemide 10 mg twice daily, which has helped with leg swelling and breathing. Blood pressure is low. Focus is on comfort and quality of life.  - Continue torsemide 10 mg twice daily - Continue use of compression stockings - Monitor fluid levels and breathing - No ability to additional heart medications due to low blood pressure and kidney dysfunction - No routine labs or additional interventions recommended - Hospice team to manage care  Ventricular ectopy Hospital notes indicate ventricular ectopy during admission. Regular rate/rhythm on exam today without ectopy. No palpitations reported by patient.  - Continue to monitor. No ability to add beta blocker with hypotension.   Hypertension Hypertension, currently not on specific antihypertensive medications due to intolerance and low blood pressure, likely secondary to end-stage heart failure. - Monitor blood pressure as part of overall hospice care  CKD Patient with AKI on CKD during recent admission. Given hospice care, will defer labwork to their team.   Aortic dilation TTE during admission showed aortic root dilation of 44mm. - No need for repeat TTE at this time. Would consider 6-12 month repeat.   Dementia Dementia, contributing to overall care complexity. No specific changes or interventions discussed during this visit. - Continue current management under hospice care  General Health Maintenance Under hospice care, focusing on comfort and quality of life. No routine labs or additional interventions recommended. - No routine labs or additional interventions - Hospice team to manage primary care needs  Goals of Care On hospice care with a focus on comfort and quality of life. Advanced directives discussed; preference for comfort measures over aggressive interventions. Some indication from spouse today that if resuscitation is possible, it should be attempted, but if not, to "let go."  - Ensure advanced directives  are on file with hospice  Follow-up - No scheduled cardiology follow-up. Per primary cardiologist Dr. Elease Hashimoto who saw patient at hospital, "as needed outpatient follow up." - Hospice team to contact cardiology if needed.              Signed, Perlie Gold, PA-C

## 2023-07-09 ENCOUNTER — Other Ambulatory Visit: Payer: Self-pay | Admitting: Medical

## 2023-07-12 ENCOUNTER — Telehealth: Payer: Self-pay | Admitting: Adult Health

## 2023-07-12 NOTE — Telephone Encounter (Signed)
Pt's daughter reports pt is in hospice but she wants to know if they should still keep the upcoming appointment for 11-25, please call to discuss.

## 2023-07-12 NOTE — Telephone Encounter (Signed)
I called pts daughter.  Pt was in hospital for CHF and is now in on hospice. She said they were preparing.   He continues with donepezil 10mg  po at bedtime. I told her that since on hospice he did not need to come in and would cancel appt.  If any change after speaking with MM/NP then will call back. Daughter verbalized understanding.  MM/NP said hospice can manage.  Appt cancelled.

## 2023-07-16 ENCOUNTER — Ambulatory Visit: Payer: Medicare HMO | Admitting: Adult Health

## 2023-08-02 ENCOUNTER — Encounter: Payer: Self-pay | Admitting: Podiatry

## 2023-08-08 ENCOUNTER — Other Ambulatory Visit: Payer: Self-pay | Admitting: Medical

## 2023-08-08 NOTE — Telephone Encounter (Signed)
Switched to something else

## 2023-08-09 DIAGNOSIS — R69 Illness, unspecified: Secondary | ICD-10-CM | POA: Diagnosis not present

## 2023-08-09 DIAGNOSIS — W19XXXA Unspecified fall, initial encounter: Secondary | ICD-10-CM | POA: Diagnosis not present

## 2023-08-16 ENCOUNTER — Ambulatory Visit: Payer: Medicare HMO | Admitting: Podiatry

## 2023-08-25 ENCOUNTER — Other Ambulatory Visit: Payer: Self-pay

## 2023-08-25 ENCOUNTER — Encounter (HOSPITAL_COMMUNITY): Payer: Self-pay

## 2023-08-25 ENCOUNTER — Emergency Department (HOSPITAL_COMMUNITY): Payer: Medicare HMO

## 2023-08-25 ENCOUNTER — Observation Stay (HOSPITAL_COMMUNITY)
Admission: EM | Admit: 2023-08-25 | Discharge: 2023-08-26 | Disposition: A | Discharge status: Hospice | Payer: Medicare HMO | Attending: Internal Medicine | Admitting: Internal Medicine

## 2023-08-25 DIAGNOSIS — I130 Hypertensive heart and chronic kidney disease with heart failure and stage 1 through stage 4 chronic kidney disease, or unspecified chronic kidney disease: Secondary | ICD-10-CM | POA: Insufficient documentation

## 2023-08-25 DIAGNOSIS — D45 Polycythemia vera: Secondary | ICD-10-CM | POA: Diagnosis present

## 2023-08-25 DIAGNOSIS — F03C18 Unspecified dementia, severe, with other behavioral disturbance: Secondary | ICD-10-CM

## 2023-08-25 DIAGNOSIS — D696 Thrombocytopenia, unspecified: Secondary | ICD-10-CM | POA: Diagnosis not present

## 2023-08-25 DIAGNOSIS — F03C0 Unspecified dementia, severe, without behavioral disturbance, psychotic disturbance, mood disturbance, and anxiety: Secondary | ICD-10-CM | POA: Diagnosis present

## 2023-08-25 DIAGNOSIS — N184 Chronic kidney disease, stage 4 (severe): Secondary | ICD-10-CM | POA: Diagnosis present

## 2023-08-25 DIAGNOSIS — F0390 Unspecified dementia without behavioral disturbance: Secondary | ICD-10-CM | POA: Diagnosis not present

## 2023-08-25 DIAGNOSIS — J4520 Mild intermittent asthma, uncomplicated: Secondary | ICD-10-CM | POA: Diagnosis present

## 2023-08-25 DIAGNOSIS — R531 Weakness: Principal | ICD-10-CM

## 2023-08-25 DIAGNOSIS — I517 Cardiomegaly: Secondary | ICD-10-CM | POA: Diagnosis not present

## 2023-08-25 DIAGNOSIS — Z7982 Long term (current) use of aspirin: Secondary | ICD-10-CM | POA: Diagnosis not present

## 2023-08-25 DIAGNOSIS — I959 Hypotension, unspecified: Secondary | ICD-10-CM | POA: Diagnosis not present

## 2023-08-25 DIAGNOSIS — I5022 Chronic systolic (congestive) heart failure: Secondary | ICD-10-CM | POA: Diagnosis present

## 2023-08-25 DIAGNOSIS — Z79899 Other long term (current) drug therapy: Secondary | ICD-10-CM | POA: Diagnosis not present

## 2023-08-25 DIAGNOSIS — Z87891 Personal history of nicotine dependence: Secondary | ICD-10-CM | POA: Diagnosis not present

## 2023-08-25 DIAGNOSIS — R918 Other nonspecific abnormal finding of lung field: Secondary | ICD-10-CM | POA: Diagnosis not present

## 2023-08-25 DIAGNOSIS — D649 Anemia, unspecified: Secondary | ICD-10-CM | POA: Diagnosis not present

## 2023-08-25 LAB — CBC WITH DIFFERENTIAL/PLATELET
Abs Immature Granulocytes: 0.07 10*3/uL (ref 0.00–0.07)
Basophils Absolute: 0 10*3/uL (ref 0.0–0.1)
Basophils Relative: 0 %
Eosinophils Absolute: 0 10*3/uL (ref 0.0–0.5)
Eosinophils Relative: 0 %
HCT: 13.9 % — ABNORMAL LOW (ref 39.0–52.0)
Hemoglobin: 4.3 g/dL — CL (ref 13.0–17.0)
Immature Granulocytes: 2 %
Lymphocytes Relative: 15 %
Lymphs Abs: 0.7 10*3/uL (ref 0.7–4.0)
MCH: 39.8 pg — ABNORMAL HIGH (ref 26.0–34.0)
MCHC: 30.9 g/dL (ref 30.0–36.0)
MCV: 128.7 fL — ABNORMAL HIGH (ref 80.0–100.0)
Monocytes Absolute: 0.5 10*3/uL (ref 0.1–1.0)
Monocytes Relative: 11 %
Neutro Abs: 3.4 10*3/uL (ref 1.7–7.7)
Neutrophils Relative %: 72 %
Platelets: 76 10*3/uL — ABNORMAL LOW (ref 150–400)
RBC: 1.08 MIL/uL — ABNORMAL LOW (ref 4.22–5.81)
RDW: 15.9 % — ABNORMAL HIGH (ref 11.5–15.5)
Smear Review: DECREASED
WBC: 4.7 10*3/uL (ref 4.0–10.5)
nRBC: 0.6 % — ABNORMAL HIGH (ref 0.0–0.2)

## 2023-08-25 LAB — COMPREHENSIVE METABOLIC PANEL
ALT: 9 U/L (ref 0–44)
AST: 20 U/L (ref 15–41)
Albumin: 3.3 g/dL — ABNORMAL LOW (ref 3.5–5.0)
Alkaline Phosphatase: 148 U/L — ABNORMAL HIGH (ref 38–126)
Anion gap: 12 (ref 5–15)
BUN: 53 mg/dL — ABNORMAL HIGH (ref 8–23)
CO2: 25 mmol/L (ref 22–32)
Calcium: 9 mg/dL (ref 8.9–10.3)
Chloride: 101 mmol/L (ref 98–111)
Creatinine, Ser: 2.22 mg/dL — ABNORMAL HIGH (ref 0.61–1.24)
GFR, Estimated: 29 mL/min — ABNORMAL LOW (ref >60)
Glucose, Bld: 102 mg/dL — ABNORMAL HIGH (ref 70–99)
Potassium: 3.5 mmol/L (ref 3.5–5.1)
Sodium: 138 mmol/L (ref 135–145)
Total Bilirubin: 0.8 mg/dL (ref 0.0–1.2)
Total Protein: 7.5 g/dL (ref 6.5–8.1)

## 2023-08-25 LAB — URINALYSIS, ROUTINE W REFLEX MICROSCOPIC
Bilirubin Urine: NEGATIVE
Glucose, UA: NEGATIVE mg/dL
Hgb urine dipstick: NEGATIVE
Ketones, ur: NEGATIVE mg/dL
Leukocytes,Ua: NEGATIVE
Nitrite: NEGATIVE
Protein, ur: NEGATIVE mg/dL
Specific Gravity, Urine: 1.012 (ref 1.005–1.030)
pH: 5 (ref 5.0–8.0)

## 2023-08-25 LAB — PREPARE RBC (CROSSMATCH)

## 2023-08-25 LAB — ABO/RH: ABO/RH(D): B POS

## 2023-08-25 LAB — AMMONIA: Ammonia: 35 umol/L (ref 9–35)

## 2023-08-25 MED ORDER — ACETAMINOPHEN 325 MG PO TABS: 650.0000 mg | ORAL_TABLET | Freq: Four times a day (QID) | ORAL | Status: DC | PRN

## 2023-08-25 MED ORDER — ONDANSETRON HCL 4 MG/2ML IJ SOLN: 4.0000 mg | Freq: Four times a day (QID) | INTRAMUSCULAR | Status: DC | PRN

## 2023-08-25 MED ORDER — SENNA 8.6 MG PO TABS: 1.0000 | ORAL_TABLET | Freq: Every day | ORAL | Status: DC | PRN

## 2023-08-25 MED ORDER — TORSEMIDE 20 MG PO TABS
10.0000 mg | ORAL_TABLET | Freq: Two times a day (BID) | ORAL | Status: DC
  Administered 2023-08-26: 10 mg via ORAL
  Filled 2023-08-25: qty 1

## 2023-08-25 MED ORDER — ONDANSETRON HCL 4 MG PO TABS: 4.0000 mg | ORAL_TABLET | Freq: Four times a day (QID) | ORAL | Status: DC | PRN

## 2023-08-25 MED ORDER — SODIUM CHLORIDE 0.9% IV SOLUTION: Freq: Once | INTRAVENOUS | Status: AC

## 2023-08-25 MED ORDER — DONEPEZIL HCL 10 MG PO TABS
10.0000 mg | ORAL_TABLET | Freq: Every day | ORAL | Status: DC
  Administered 2023-08-26: 10 mg via ORAL
  Filled 2023-08-25: qty 1

## 2023-08-25 MED ORDER — SODIUM CHLORIDE 0.9% FLUSH
3.0000 mL | Freq: Two times a day (BID) | INTRAVENOUS | Status: DC
  Administered 2023-08-25 – 2023-08-26 (×2): 3 mL via INTRAVENOUS

## 2023-08-25 MED ORDER — ALBUTEROL SULFATE (2.5 MG/3ML) 0.083% IN NEBU: 2.5000 mg | INHALATION_SOLUTION | Freq: Four times a day (QID) | RESPIRATORY_TRACT | Status: DC | PRN

## 2023-08-25 MED ORDER — ACETAMINOPHEN 650 MG RE SUPP: 650.0000 mg | Freq: Four times a day (QID) | RECTAL | Status: DC | PRN

## 2023-08-25 MED ORDER — MELATONIN 3 MG PO TABS
6.0000 mg | ORAL_TABLET | Freq: Every day | ORAL | Status: DC
  Administered 2023-08-25: 6 mg via ORAL
  Filled 2023-08-25: qty 2

## 2023-08-25 MED ORDER — OLANZAPINE 5 MG PO TABS
5.0000 mg | ORAL_TABLET | Freq: Every day | ORAL | Status: DC
  Administered 2023-08-25: 5 mg via ORAL
  Filled 2023-08-25: qty 1

## 2023-08-25 MED ORDER — FUROSEMIDE 10 MG/ML IJ SOLN
20.0000 mg | Freq: Once | INTRAMUSCULAR | Status: AC
  Administered 2023-08-25: 20 mg via INTRAVENOUS
  Filled 2023-08-25: qty 2

## 2023-08-25 NOTE — ED Provider Triage Note (Signed)
 Emergency Medicine Provider Triage Evaluation Note  JARI DIPASQUALE , a 78 y.o. male  was evaluated in triage.  Pt presents to emergency department by EMS from home.  Family called EMS due to generalized weakness for 3 days.  He is a hospice patient and ANO x 2 at baseline.  EMS report that they noted blood in bed but was unsure if it was from rectum or urethra.    Currently he has no complaints of pain.  Review of Systems  Positive: generalized weakness Negative: fever  Physical Exam  BP 96/73 (BP Location: Right Arm)   Pulse 89   Temp 97.6 F (36.4 C) (Oral)   Resp 20   SpO2 100%  Gen:   Awake, no distress   Resp:  Normal effort  MSK:   Moves extremities without difficulty  Other:    Medical Decision Making  Medically screening exam initiated at 2:36 PM.  Appropriate orders placed.  MCKAY TEGTMEYER was informed that the remainder of the evaluation will be completed by another provider, this initial triage assessment does not replace that evaluation, and the importance of remaining in the ED until their evaluation is complete.  Labs ordered   Minnie Tinnie BRAVO, [place] 08/25/23 1438

## 2023-08-25 NOTE — ED Notes (Signed)
 ED TO INPATIENT HANDOFF REPORT  ED Nurse Name and Phone #: Jerona [phone]  S Name/Age/Gender James Bryant 78 y.o. male Room/Bed: 024C/024C  Code Status   Code Status: Limited: Do not attempt resuscitation (DNR) -DNR-LIMITED -Do Not Intubate/DNI   Home/SNF/Other Home Patient oriented to: self, place, and time Is this baseline? Yes   Triage Complete: Triage complete  Chief Complaint Symptomatic anemia [D64.9]  Triage Note Pt BIB [facility] from home c/o generalized weakness x several days. EMS also noticed when moving him it looked like he had blood coming from his rectum. Pt is alert and oriented x2 at baseline and is a hospice pt.    112/46 86 100 140 CBG   Allergies No Known Allergies  Level of Care/Admitting Diagnosis ED Disposition     ED Disposition  Admit   Condition  --   Comment  Hospital Area: [facility] [[id number]]  Level of Care: Telemetry Medical [104]  May place patient in observation at [facility] or [facility] if equivalent level of care is available:: Yes  Covid Evaluation: Asymptomatic - no recent exposure (last 10 days) testing not required  Diagnosis: Symptomatic anemia [8671310]  Admitting Physician: CHARLTON EVALENE RAMAN [[id number]]  Attending Physician: CHARLTON EVALENE RAMAN [[id number]]          B Medical/Surgery History Past Medical History:  Diagnosis Date   Asthma    Calculus of gallbladder without cholecystitis without obstruction 10/18/2022   CHF (congestive heart failure) (HCC)    CKD (chronic kidney disease), stage III (HCC)    Dementia (HCC)    Elevated liver function tests 01/17/2019   Fear of needles 01/17/2019   Former smoker, stopped smoking many years ago 01/17/2019   Hypertension    Polycythemia 01/17/2019   Thrombocytosis 01/17/2019   History reviewed. No pertinent surgical history.   A IV Location/Drains/Wounds Patient Lines/Drains/Airways Status     Active Line/Drains/Airways     Name Placement date  Placement time Site Days   Peripheral IV 08/25/23 20 G Left Forearm 08/25/23  1639  Forearm  less than 1            Intake/Output Last 24 hours No intake or output data in the 24 hours ending 08/25/23 1903  Labs/Imaging Results for orders placed or performed during the hospital encounter of 08/25/23 (from the past 48 hours)  Ammonia     Status: None   Collection Time: 08/25/23  2:43 PM  Result Value Ref Range   Ammonia 35 9 - 35 umol/L    Comment: Performed at [facility], [address] [place], [place] [address]  Comprehensive metabolic panel     Status: Abnormal   Collection Time: 08/25/23  2:44 PM  Result Value Ref Range   Sodium 138 135 - 145 mmol/L   Potassium 3.5 3.5 - 5.1 mmol/L   Chloride 101 98 - 111 mmol/L   CO2 25 22 - 32 mmol/L   Glucose, Bld 102 (H) 70 - 99 mg/dL    Comment: Glucose reference range applies only to samples taken after fasting for at least 8 hours.   BUN 53 (H) 8 - 23 mg/dL   Creatinine, Ser 7.77 (H) 0.61 - 1.24 mg/dL   Calcium 9.0 8.9 - 89.6 mg/dL   Total Protein 7.5 6.5 - 8.1 g/dL   Albumin 3.3 (L) 3.5 - 5.0 g/dL   AST 20 15 - 41 U/L   ALT 9 0 - 44 U/L   Alkaline Phosphatase  148 (H) 38 - 126 U/L   Total Bilirubin 0.8 0.0 - 1.2 mg/dL   GFR, Estimated 29 (L) >60 mL/min    Comment: (NOTE) Calculated using the CKD-EPI Creatinine Equation (2021)    Anion gap 12 5 - 15    Comment: Performed at [facility], [address] [place], [place] [address]  CBC with Differential     Status: Abnormal   Collection Time: 08/25/23  2:44 PM  Result Value Ref Range   WBC 4.7 4.0 - 10.5 K/uL   RBC 1.08 (L) 4.22 - 5.81 MIL/uL   Hemoglobin 4.3 (LL) 13.0 - 17.0 g/dL    Comment: This critical result has verified and been called to [facility], RN by EZZIE ANGRY on 01 04 2025 at 1510, and has been read back.  REPEATED TO VERIFY CORRECTED ON 01/04 AT 1513: PREVIOUSLY REPORTED AS 4.3 This critical result has verified and been called to  [facility], RN by EZZIE ANGRY on 01 04 2025 at 1510, and has been read back.     HCT 13.9 (L) 39.0 - 52.0 %   MCV 128.7 (H) 80.0 - 100.0 fL   MCH 39.8 (H) 26.0 - 34.0 pg   MCHC 30.9 30.0 - 36.0 g/dL   RDW 84.0 (H) 88.4 - 84.4 %   Platelets 76 (L) 150 - 400 K/uL    Comment: SPECIMEN CHECKED FOR CLOTS Immature Platelet Fraction may be clinically indicated, consider ordering this additional test [id number] REPEATED TO VERIFY PLATELET COUNT CONFIRMED BY SMEAR    nRBC 0.6 (H) 0.0 - 0.2 %   Neutrophils Relative % 72 %   Neutro Abs 3.4 1.7 - 7.7 K/uL   Lymphocytes Relative 15 %   Lymphs Abs 0.7 0.7 - 4.0 K/uL   Monocytes Relative 11 %   Monocytes Absolute 0.5 0.1 - 1.0 K/uL   Eosinophils Relative 0 %   Eosinophils Absolute 0.0 0.0 - 0.5 K/uL   Basophils Relative 0 %   Basophils Absolute 0.0 0.0 - 0.1 K/uL   WBC Morphology MORPHOLOGY UNREMARKABLE    RBC Morphology See Note    Smear Review PLATELETS APPEAR DECREASED    Immature Granulocytes 2 %   Abs Immature Granulocytes 0.07 0.00 - 0.07 K/uL   Ovalocytes PRESENT     Comment: Performed at [facility], [address], [place], [place] [address]  Urinalysis, Routine w reflex microscopic -Urine, Clean Catch     Status: None   Collection Time: 08/25/23  5:37 PM  Result Value Ref Range   Color, Urine YELLOW YELLOW   APPearance CLEAR CLEAR   Specific Gravity, Urine 1.012 1.005 - 1.030   pH 5.0 5.0 - 8.0   Glucose, UA NEGATIVE NEGATIVE mg/dL   Hgb urine dipstick NEGATIVE NEGATIVE   Bilirubin Urine NEGATIVE NEGATIVE   Ketones, ur NEGATIVE NEGATIVE mg/dL   Protein, ur NEGATIVE NEGATIVE mg/dL   Nitrite NEGATIVE NEGATIVE   Leukocytes,Ua NEGATIVE NEGATIVE    Comment: Performed at [facility], [address], [place], [place] [address]  Type and screen [facility]     Status: None (Preliminary result)   Collection Time: 08/25/23  6:10 PM  Result Value Ref Range   ABO/RH(D) PENDING    Antibody  Screen PENDING    Sample Expiration      08/28/2023,2359 Performed at [facility], [address], [place], [place] [address]   ABO/Rh     Status: None (Preliminary  result)   Collection Time: 08/25/23  6:16 PM  Result Value Ref Range   ABO/RH(D) PENDING   Prepare RBC (crossmatch)     Status: None   Collection Time: 08/25/23  6:16 PM  Result Value Ref Range   Order Confirmation      ORDER PROCESSED BY BLOOD BANK Performed at [facility], [address], [place], [place] [address]    DG Chest Port 1 View Result Date: 08/25/2023 CLINICAL DATA:  Generalized weakness EXAM: PORTABLE CHEST 1 VIEW COMPARISON:  04/23/2023.  Older exams as well. FINDINGS: Enlarged cardiopericardial silhouette. No pneumothorax, effusion or edema. Degenerative changes of the spine. The left inferior costophrenic angle is clipped off the edge of the film. Subtle left midlung opacity. IMPRESSION: Enlarged heart.  Subtle left midlung opacity.  Recommend follow-up. Electronically Signed   By: Ranell Bring M.D.   On: 08/25/2023 16:37    Pending Labs Unresulted Labs (From admission, onward)     Start     Ordered   08/26/23 0500  Basic metabolic panel  Tomorrow morning,   R        08/25/23 1846   08/26/23 0500  Magnesium  Tomorrow morning,   R        08/25/23 1846   08/26/23 0500  Hepatic function panel  Tomorrow morning,   R        08/25/23 1846   08/26/23 0000  CBC  Now then every 8 hours,   R (with TIMED occurrences)      08/25/23 1846            Vitals/Pain Today's Vitals   08/25/23 1645 08/25/23 1700 08/25/23 1730 08/25/23 1803  BP: (!) 104/49 (!) 104/52 (!) 110/59 (!) 115/56  Pulse:    90  Resp: 13 16 15 12   Temp:    (!) 97.4 F (36.3 C)  TempSrc:    Oral  SpO2:    98%  PainSc:        Isolation Precautions No active isolations  Medications Medications  0.9 %  sodium chloride  infusion (Manually program via Guardrails IV Fluids) (has no administration in time range)  torsemide   (DEMADEX ) tablet 10 mg (has no administration in time range)  OLANZapine  (ZYPREXA ) tablet 5 mg (has no administration in time range)  donepezil  (ARICEPT ) tablet 10 mg (has no administration in time range)  melatonin tablet 6 mg (has no administration in time range)  albuterol  (PROVENTIL ) (2.5 MG/3ML) 0.083% nebulizer solution 2.5 mg (has no administration in time range)  0.9 %  sodium chloride  infusion (Manually program via Guardrails IV Fluids) (has no administration in time range)  furosemide  (LASIX ) injection 20 mg (has no administration in time range)  sodium chloride  flush (NS) 0.9 % injection 3 mL (has no administration in time range)  acetaminophen  (TYLENOL ) tablet 650 mg (has no administration in time range)    Or  acetaminophen  (TYLENOL ) suppository 650 mg (has no administration in time range)  ondansetron  (ZOFRAN ) tablet 4 mg (has no administration in time range)    Or  ondansetron  (ZOFRAN ) injection 4 mg (has no administration in time range)  senna (SENOKOT) tablet 8.6 mg (has no administration in time range)    Mobility Weakness with any ambulation     Focused Assessments     R Recommendations: See Admitting Provider Note  Report given to:   Additional Notes: Call or message with any questions.

## 2023-08-25 NOTE — H&P (Addendum)
 History and Physical    James Bryant FMW:[id number] DOB: 08/04/1946 DOA: 08/25/2023  PCP: [name], PA-C   Patient coming from: Home   Chief Complaint: Generalized weakness   HPI: James Bryant is a 78 y.o. male with medical history significant for severe dementia, polycythemia vera, CKD stage IV, and end-stage CHF with EF <20% who presents with generalized weakness.  Patient is on hospice at home and has been noted by family to be progressively lethargic and generally weak over the past few days.  Patient has not been complaining of anything recently and has no acute complaints in the ED tonight.  He denies abdominal pain, has not been vomiting, and no melena or hematochezia has been noted at home but family saw a couple drops of blood on his bedsheet here in the ED that they thought may have come from his rectum.  ED Course: Upon arrival to the ED, patient is found to be afebrile and saturating well on room air with normal heart rate and systolic blood pressure of 90 and greater.  Labs are most notable for BUN 53, creatinine 2.22, hemoglobin 4.3, and platelets 76,000.  Type and screen was performed and 2 units RBC were ordered for transfusion from the ED.  Review of Systems:  ROS limited by patient's clinical condition.  Past Medical History:  Diagnosis Date   Asthma    Calculus of gallbladder without cholecystitis without obstruction 10/18/2022   CHF (congestive heart failure) (HCC)    CKD (chronic kidney disease), stage III (HCC)    Dementia (HCC)    Elevated liver function tests 01/17/2019   Fear of needles 01/17/2019   Former smoker, stopped smoking many years ago 01/17/2019   Hypertension    Polycythemia 01/17/2019   Thrombocytosis 01/17/2019    History reviewed. No pertinent surgical history.  Social History:   reports that he has quit smoking. He has never used smokeless tobacco. He reports that he does not drink alcohol and does not use drugs.  No  Known Allergies  Family History  Problem Relation Age of Onset   Heart disease Mother    Hypertension Mother    Alzheimer's disease Neg Hx    Dementia Neg Hx      Prior to Admission medications   Medication Sig Start Date End Date Taking? Authorizing Provider  albuterol  (PROVENTIL ) (2.5 MG/3ML) 0.083% nebulizer solution Take 3 mLs (2.5 mg total) by nebulization every 6 (six) hours as needed for wheezing or shortness of breath. 09/06/22   Tysinger, Alm RAMAN, PA-C  ASPIRIN  LOW DOSE 81 MG tablet TAKE 1 TABLET EVERY DAY 09/29/22   Tysinger, Alm RAMAN, PA-C  brimonidine (ALPHAGAN) 0.2 % ophthalmic solution  07/17/22   [provider]  donepezil  (ARICEPT ) 10 MG tablet Take 10 mg by mouth daily. 05/15/23   [provider]  ferrous gluconate  (FERGON) 324 MG tablet Take 1 tablet (324 mg total) by mouth daily with breakfast. 04/11/23   Tysinger, Alm RAMAN, PA-C  guaiFENesin (MUCINEX) 600 MG 12 hr tablet Take 600 mg by mouth 2 (two) times daily as needed for cough.    [provider]  hydroxyurea  (HYDREA ) 500 MG capsule Take 1 capsule (500 mg total) by mouth 2 (two) times daily. May take with food to minimize GI side effects. 04/11/23   Tysinger, Alm RAMAN, PA-C  melatonin 3 MG TABS tablet Take 2 tablets (6 mg total) by mouth at bedtime. 05/09/23   Tysinger, Alm RAMAN, PA-C  metolazone  (ZAROXOLYN )  5 MG tablet Take 5 mg by mouth daily as needed.    [provider]  Multiple Vitamins-Minerals (MEGA MULTIVITAMIN FOR MEN PO) Take 1 tablet by mouth daily.    [provider]  OLANZapine  (ZYPREXA ) 5 MG tablet Take 1 tablet (5 mg total) by mouth at bedtime. 04/11/23   Tysinger, Alm RAMAN, PA-C  potassium chloride  (KLOR-CON ) 10 MEQ tablet TAKE 1 TABLET TWICE DAILY 07/09/23   Tysinger, Alm RAMAN, PA-C  torsemide  (DEMADEX ) 10 MG tablet TAKE 1 TABLET(10 MG) BY MOUTH TWICE DAILY 06/06/23   [name], PA-C    Physical Exam: Vitals:   08/25/23 1645 08/25/23 1700 08/25/23 1730  08/25/23 1803  BP: (!) 104/49 (!) 104/52 (!) 110/59 (!) 115/56  Pulse:    90  Resp: 13 16 15 12   Temp:    (!) 97.4 F (36.3 C)  TempSrc:    Oral  SpO2:    98%    Constitutional: NAD, no diaphoresis   Eyes: PERTLA, lids and conjunctivae normal ENMT: Mucous membranes are moist. Posterior pharynx clear of any exudate or lesions.   Neck: supple, no masses  Respiratory: no wheezing, no crackles. No accessory muscle use.  Cardiovascular: S1 & S2 heard, regular rate and rhythm. Trace bilateral leg edema.   Abdomen: No distension, no tenderness, soft. Bowel sounds active.  Musculoskeletal: no clubbing / cyanosis. No joint deformity upper and lower extremities.   Skin: no significant rashes, lesions, ulcers. Warm, dry, well-perfused. Neurologic: CN 2-12 grossly intact. Moving all extremities. Alert, oriented to self and recognizes family.  Psychiatric: Calm. Cooperative.    Labs and Imaging on Admission: I have personally reviewed following labs and imaging studies  CBC: Recent Labs  Lab 08/25/23 1444  WBC 4.7  NEUTROABS 3.4  HGB 4.3*  HCT 13.9*  MCV 128.7*  PLT 76*   Basic Metabolic Panel: Recent Labs  Lab 08/25/23 1444  NA 138  K 3.5  CL 101  CO2 25  GLUCOSE 102*  BUN 53*  CREATININE 2.22*  CALCIUM 9.0   GFR: CrCl cannot be calculated (Unknown ideal weight.). Liver Function Tests: Recent Labs  Lab 08/25/23 1444  AST 20  ALT 9  ALKPHOS 148*  BILITOT 0.8  PROT 7.5  ALBUMIN 3.3*   No results for input(s): LIPASE, AMYLASE in the last 168 hours. Recent Labs  Lab 08/25/23 1443  AMMONIA 35   Coagulation Profile: No results for input(s): INR, PROTIME in the last 168 hours. Cardiac Enzymes: No results for input(s): CKTOTAL, CKMB, CKMBINDEX, TROPONINI in the last 168 hours. BNP (last 3 results) No results for input(s): PROBNP in the last 8760 hours. HbA1C: No results for input(s): HGBA1C in the last 72 hours. CBG: No results for  input(s): GLUCAP in the last 168 hours. Lipid Profile: No results for input(s): CHOL, HDL, LDLCALC, TRIG, CHOLHDL, LDLDIRECT in the last 72 hours. Thyroid  Function Tests: No results for input(s): TSH, T4TOTAL, FREET4, T3FREE, THYROIDAB in the last 72 hours. Anemia Panel: No results for input(s): VITAMINB12, FOLATE, FERRITIN, TIBC, IRON , RETICCTPCT in the last 72 hours. Urine analysis:    Component Value Date/Time   COLORURINE YELLOW 08/25/2023 1737   APPEARANCEUR CLEAR 08/25/2023 1737   APPEARANCEUR Cloudy (A) 08/31/2022 1111   LABSPEC 1.012 08/25/2023 1737   LABSPEC 1.015 04/06/2022 1307   PHURINE 5.0 08/25/2023 1737   GLUCOSEU NEGATIVE 08/25/2023 1737   HGBUR NEGATIVE 08/25/2023 1737   BILIRUBINUR NEGATIVE 08/25/2023 1737   BILIRUBINUR CANCELED 08/31/2022 1111   KETONESUR  NEGATIVE 08/25/2023 1737   PROTEINUR NEGATIVE 08/25/2023 1737   NITRITE NEGATIVE 08/25/2023 1737   LEUKOCYTESUR NEGATIVE 08/25/2023 1737   Sepsis Labs: @LABRCNTIP (procalcitonin:4,lacticidven:4) )No results found for this or any previous visit (from the past 240 hours).   Radiological Exams on Admission: DG Chest Port 1 View Result Date: 08/25/2023 CLINICAL DATA:  Generalized weakness EXAM: PORTABLE CHEST 1 VIEW COMPARISON:  04/23/2023.  Older exams as well. FINDINGS: Enlarged cardiopericardial silhouette. No pneumothorax, effusion or edema. Degenerative changes of the spine. The left inferior costophrenic angle is clipped off the edge of the film. Subtle left midlung opacity. IMPRESSION: Enlarged heart.  Subtle left midlung opacity.  Recommend follow-up. Electronically Signed   By: Ranell Bring M.D.   On: 08/25/2023 16:37    EKG: Independently reviewed. Accelerated junctional rhythm, LVH with repolarization abnormality.   Assessment/Plan   1. Symptomatic anemia  - No overt bleeding, no abdominal pain, BUN lower now - He appears to be tolerating severe anemia fairly well  and there has likely been a slow drop  - Discussed options with family and agreed on plan to stop Hydrea , transfuse 2 units RBC now, consider a 3rd unit after post-transfusion CBC, and then return home with hospice   2. Thrombocytopenia  - Platelets 76,000 on admission  - Stop Hydrea  and monitor    3. Chronic HFrEF  - EF <20% on TTE from September 2024  - Appears compensated  - Beta-blocker and ARB d/c'd in September d/t hypotension  - Give IV Lasix  with blood transfusion tonight, resume torsemide  in am    4. CKD IV  - SCr is 2.22, down from admission in September  - Renally-dose medications    5. Asthma  - Not in exacerbation   - Albuterol  as-needed   6. Dementia  - Continue Aricept  and Zyprexa , use delirium precautions    DVT prophylaxis: SCDs  Code Status: Full. Patient lacks decision-making capability and his wife would like for him to have a brief attempt at CPR if pulseless.    Level of Care: Level of care: Telemetry Medical Family Communication: Wife and daughter at bedside   Disposition Plan:  Patient is from: home  Anticipated d/c is to: Home  Anticipated d/c date is: 08/25/22  Patient currently: Pending improved H&H  Consults called: None  Admission status: Observation     Evalene GORMAN Sprinkles, MD [facility]  08/25/2023, 6:46 PM

## 2023-08-25 NOTE — ED Triage Notes (Signed)
 Pt BIB [facility] from home c/o generalized weakness x several days. EMS also noticed when moving him it looked like he had blood coming from his rectum. Pt is alert and oriented x2 at baseline and is a hospice pt.    112/46 86 100 140 CBG

## 2023-08-25 NOTE — Progress Notes (Signed)
 [facility] Liaison Note  Patient is currently active with [facility] hospice.  Liaison will follow for discharge disposition.   Doreatha Martin, RN, [facility] [phone]

## 2023-08-25 NOTE — ED Provider Notes (Signed)
 [place] EMERGENCY DEPARTMENT AT [facility] Provider Note   CSN: [id number] Arrival date & time: 08/25/23  1246     History No chief complaint on file.   HPI James Bryant is a 78 y.o. male presenting for AMS. HX of CHF, CAD, PV.   Patient's recorded medical, surgical, social, medication list and allergies were reviewed in the Snapshot window as part of the initial history.   Review of Systems   Review of Systems  Constitutional:  Positive for fatigue. Negative for chills and fever.  HENT:  Negative for ear pain and sore throat.   Eyes:  Negative for pain and visual disturbance.  Respiratory:  Negative for cough and shortness of breath.   Cardiovascular:  Negative for chest pain and palpitations.  Gastrointestinal:  Negative for abdominal pain and vomiting.  Genitourinary:  Negative for dysuria and hematuria.  Musculoskeletal:  Negative for arthralgias and back pain.  Skin:  Negative for color change and rash.  Neurological:  Positive for weakness. Negative for seizures and syncope.  All other systems reviewed and are negative.   Physical Exam Updated Vital Signs BP (!) 108/51 (BP Location: Right Arm)   Pulse 96   Temp 98.5 F (36.9 C) (Oral)   Resp 18   Ht 5' 7 (1.702 m)   Wt 60 kg   SpO2 100%   BMI 20.72 kg/m  Physical Exam Vitals and nursing note reviewed.  Constitutional:      General: He is not in acute distress.    Appearance: He is well-developed.  HENT:     Head: Normocephalic and atraumatic.  Eyes:     Conjunctiva/sclera: Conjunctivae normal.  Cardiovascular:     Rate and Rhythm: Normal rate and regular rhythm.     Heart sounds: No murmur heard. Pulmonary:     Effort: Pulmonary effort is normal. No respiratory distress.     Breath sounds: Normal breath sounds.  Abdominal:     Palpations: Abdomen is soft.     Tenderness: There is no abdominal tenderness.  Musculoskeletal:        General: No swelling.     Cervical back: Neck  supple.  Skin:    General: Skin is warm and dry.     Capillary Refill: Capillary refill takes less than 2 seconds.  Neurological:     Mental Status: He is alert.  Psychiatric:        Mood and Affect: Mood normal.      ED Course/ Medical Decision Making/ A&P    Procedures .Critical Care  Performed by: Jerral Meth, MD Authorized by: Jerral Meth, MD   Critical care provider statement:    Critical care time (minutes):  30   Critical care was necessary to treat or prevent imminent or life-threatening deterioration of the following conditions:  Circulatory failure   Critical care was time spent personally by me on the following activities:  Development of treatment plan with patient or surrogate, discussions with consultants, evaluation of patient's response to treatment, examination of patient, ordering and review of laboratory studies, ordering and review of radiographic studies, ordering and performing treatments and interventions, pulse oximetry, re-evaluation of patient's condition and review of old charts    Medications Ordered in ED Medications  torsemide  (DEMADEX ) tablet 10 mg (has no administration in time range)  OLANZapine  (ZYPREXA ) tablet 5 mg (5 mg Oral Given 08/25/23 2300)  donepezil  (ARICEPT ) tablet 10 mg (has no administration in time range)  melatonin tablet 6 mg (6  mg Oral Given 08/25/23 2259)  albuterol  (PROVENTIL ) (2.5 MG/3ML) 0.083% nebulizer solution 2.5 mg (has no administration in time range)  sodium chloride  flush (NS) 0.9 % injection 3 mL (3 mLs Intravenous Given 08/25/23 2306)  acetaminophen  (TYLENOL ) tablet 650 mg (has no administration in time range)    Or  acetaminophen  (TYLENOL ) suppository 650 mg (has no administration in time range)  ondansetron  (ZOFRAN ) tablet 4 mg (has no administration in time range)    Or  ondansetron  (ZOFRAN ) injection 4 mg (has no administration in time range)  senna (SENOKOT) tablet 8.6 mg (has no administration in time  range)  0.9 %  sodium chloride  infusion (Manually program via Guardrails IV Fluids) ( Intravenous New Bag/Given 08/25/23 2124)  0.9 %  sodium chloride  infusion (Manually program via Guardrails IV Fluids) ( Intravenous Duplicate 08/25/23 2224)  furosemide  (LASIX ) injection 20 mg (20 mg Intravenous Given 08/25/23 2259)    Medical Decision Making:   Prolonged conversation with patient and family.  He is end-of-life on hospice care.  However he is end-of-life for his congestive heart failure and the polycythemia vera have been well-managed. His new acute anemia is most consistent with medication effect. Family would like to proceed with transfusion and medical admission for observation. 2 units of RBCs were infused and patient was arranged for admission for reassessment. Goal is for return to prior level of strength so that he can continue with his outpatient hospice care plan.  Disposition:   Based on the above findings, I believe this patient is stable for admission.    Patient/family educated about specific findings on our evaluation and explained exact reasons for admission.  Patient/family educated about clinical situation and time was allowed to answer questions.   Admission team communicated with and agreed with need for admission. Patient admitted. Patient ready to move at this time.     Emergency Department Medication Summary:   Medications  torsemide  (DEMADEX ) tablet 10 mg (has no administration in time range)  OLANZapine  (ZYPREXA ) tablet 5 mg (5 mg Oral Given 08/25/23 2300)  donepezil  (ARICEPT ) tablet 10 mg (has no administration in time range)  melatonin tablet 6 mg (6 mg Oral Given 08/25/23 2259)  albuterol  (PROVENTIL ) (2.5 MG/3ML) 0.083% nebulizer solution 2.5 mg (has no administration in time range)  sodium chloride  flush (NS) 0.9 % injection 3 mL (3 mLs Intravenous Given 08/25/23 2306)  acetaminophen  (TYLENOL ) tablet 650 mg (has no administration in time range)    Or  acetaminophen   (TYLENOL ) suppository 650 mg (has no administration in time range)  ondansetron  (ZOFRAN ) tablet 4 mg (has no administration in time range)    Or  ondansetron  (ZOFRAN ) injection 4 mg (has no administration in time range)  senna (SENOKOT) tablet 8.6 mg (has no administration in time range)  0.9 %  sodium chloride  infusion (Manually program via Guardrails IV Fluids) ( Intravenous New Bag/Given 08/25/23 2124)  0.9 %  sodium chloride  infusion (Manually program via Guardrails IV Fluids) ( Intravenous Duplicate 08/25/23 2224)  furosemide  (LASIX ) injection 20 mg (20 mg Intravenous Given 08/25/23 2259)        Clinical Impression:  1. Weakness      Admit   Final Clinical Impression(s) / ED Diagnoses Final diagnoses:  Weakness    Rx / DC Orders ED Discharge Orders     None         Jerral Meth, MD 08/26/23 0002

## 2023-08-26 DIAGNOSIS — D649 Anemia, unspecified: Secondary | ICD-10-CM | POA: Diagnosis not present

## 2023-08-26 DIAGNOSIS — Z515 Encounter for palliative care: Secondary | ICD-10-CM

## 2023-08-26 LAB — BASIC METABOLIC PANEL
Anion gap: 13 (ref 5–15)
BUN: 52 mg/dL — ABNORMAL HIGH (ref 8–23)
CO2: 26 mmol/L (ref 22–32)
Calcium: 8.7 mg/dL — ABNORMAL LOW (ref 8.9–10.3)
Chloride: 100 mmol/L (ref 98–111)
Creatinine, Ser: 2.2 mg/dL — ABNORMAL HIGH (ref 0.61–1.24)
GFR, Estimated: 30 mL/min — ABNORMAL LOW (ref >60)
Glucose, Bld: 110 mg/dL — ABNORMAL HIGH (ref 70–99)
Potassium: 3.5 mmol/L (ref 3.5–5.1)
Sodium: 139 mmol/L (ref 135–145)

## 2023-08-26 LAB — CBC
HCT: 18.8 % — ABNORMAL LOW (ref 39.0–52.0)
HCT: 19.7 % — ABNORMAL LOW (ref 39.0–52.0)
Hemoglobin: 6.3 g/dL — CL (ref 13.0–17.0)
Hemoglobin: 6.6 g/dL — CL (ref 13.0–17.0)
MCH: 33.5 pg (ref 26.0–34.0)
MCH: 33.8 pg (ref 26.0–34.0)
MCHC: 33.5 g/dL (ref 30.0–36.0)
MCHC: 33.5 g/dL (ref 30.0–36.0)
MCV: 100 fL (ref 80.0–100.0)
MCV: 101 fL — ABNORMAL HIGH (ref 80.0–100.0)
Platelets: 66 10*3/uL — ABNORMAL LOW (ref 150–400)
Platelets: 64 10*3/uL — ABNORMAL LOW (ref 150–400)
RBC: 1.88 MIL/uL — ABNORMAL LOW (ref 4.22–5.81)
RBC: 1.95 MIL/uL — ABNORMAL LOW (ref 4.22–5.81)
WBC: 3.6 10*3/uL — ABNORMAL LOW (ref 4.0–10.5)
WBC: 4.4 10*3/uL (ref 4.0–10.5)
nRBC: 1.4 % — ABNORMAL HIGH (ref 0.0–0.2)
nRBC: 1.4 % — ABNORMAL HIGH (ref 0.0–0.2)

## 2023-08-26 LAB — HEPATIC FUNCTION PANEL
ALT: 9 U/L (ref 0–44)
AST: 21 U/L (ref 15–41)
Albumin: 3 g/dL — ABNORMAL LOW (ref 3.5–5.0)
Alkaline Phosphatase: 135 U/L — ABNORMAL HIGH (ref 38–126)
Bilirubin, Direct: 0.4 mg/dL — ABNORMAL HIGH (ref 0.0–0.2)
Indirect Bilirubin: 1.4 mg/dL — ABNORMAL HIGH (ref 0.3–0.9)
Total Bilirubin: 1.8 mg/dL — ABNORMAL HIGH (ref 0.0–1.2)
Total Protein: 6.8 g/dL (ref 6.5–8.1)

## 2023-08-26 LAB — PREPARE RBC (CROSSMATCH)

## 2023-08-26 LAB — MAGNESIUM: Magnesium: 2.5 mg/dL — ABNORMAL HIGH (ref 1.7–2.4)

## 2023-08-26 MED ORDER — SODIUM CHLORIDE 0.9% IV SOLUTION: Freq: Once | INTRAVENOUS | Status: AC

## 2023-08-26 NOTE — Progress Notes (Signed)
Start of blood transfusion.

## 2023-08-26 NOTE — Progress Notes (Signed)
 Tele discontinued. Central monitoring made aware

## 2023-08-26 NOTE — Progress Notes (Signed)
 Start of blood transfusion @1337 

## 2023-08-26 NOTE — Care Management Obs Status (Addendum)
 MEDICARE OBSERVATION STATUS NOTIFICATION   Patient Details  Name: James Bryant MRN: [id number] Date of Birth: Jan 21, 1946   Medicare Observation Status Notification Given:  Yes  Patient had difficulty signing on laptop.  Robynn Eileen Hoose, RN 08/26/2023, 7:55 AM

## 2023-08-26 NOTE — Progress Notes (Addendum)
 30 min completed transfusion No reactions noted

## 2023-08-26 NOTE — Progress Notes (Signed)
Blood transfusion completed. No adverse reaction.

## 2023-08-26 NOTE — Plan of Care (Signed)
  Problem: Education: Goal: Knowledge of General Education information will improve Description: Including pain rating scale, medication(s)/side effects and non-pharmacologic comfort measures Outcome: Progressing   Problem: Clinical Measurements: Goal: Ability to maintain clinical measurements within normal limits will improve Outcome: Progressing Goal: Diagnostic test results will improve Outcome: Progressing   Problem: Coping: Goal: Level of anxiety will decrease Outcome: Progressing

## 2023-08-26 NOTE — TOC Transition Note (Signed)
 Transition of Care [facility]) - Discharge Note   Patient Details  Name: James Bryant MRN: [id number] Date of Birth: 1945-12-03  Transition of Care [facility]) CM/SW Contact:  Dino CHRISTELLA Au, LCSWA Phone Number: 08/26/2023, 4:25 PM   Clinical Narrative:     Pt to d/c home with [facility] following transfusion via family.   Med Nec on chart incase family unable to transport after hours and PTAR needs to be arranged. PTAR can be called at [phone], option 1 then option 3.      Final next level of care: Home w Hospice Care Barriers to Discharge: Barriers Resolved   Patient Goals and CMS Choice            Discharge Placement                    Patient and family notified of of transfer: 08/26/23  Discharge Plan and Services Additional resources added to the After Visit Summary for                                       Social Drivers of Health (SDOH) Interventions SDOH Screenings   Food Insecurity: No Food Insecurity (08/25/2023)  Housing: Low Risk  (08/25/2023)  Transportation Needs: No Transportation Needs (08/25/2023)  Utilities: Not At Risk (08/25/2023)  Alcohol Screen: Low Risk  (04/09/2023)  Depression (PHQ2-9): Low Risk  (04/09/2023)  Financial Resource Strain: Low Risk  (04/09/2023)  Physical Activity: Unknown (04/09/2023)  Social Connections: Socially Isolated (08/25/2023)  Stress: No Stress Concern Present (04/09/2023)  Tobacco Use: Medium Risk (08/25/2023)     Readmission Risk Interventions     No data to display

## 2023-08-26 NOTE — Discharge Summary (Signed)
 Physician Discharge Summary  James Bryant FMW:[id number] DOB: 1945/09/26 DOA: 08/25/2023  PCP: [name], PA-C  Admit date: 08/25/2023 Discharge date: 08/26/2023  Admitted From: Home Disposition:  Home  Recommendations for Outpatient Follow-up:  Follow up with home hospice as scheduled  Discharge Condition:Stable  CODE STATUS:Full  Diet recommendation:  As tolerated  Brief/Interim Summary: James Bryant is a 78 y.o. male with medical history significant for severe dementia, polycythemia vera, CKD stage IV, and end-stage CHF with EF <20% who presents with generalized weakness. Found to be anemic - status post transfusion patient and family request to discharge home back to home hospice.  Discharge Diagnoses:  Principal Problem:   Symptomatic anemia Active Problems:   Polycythemia vera (HCC)   CKD (chronic kidney disease) stage 4, GFR 15-29 ml/min (HCC)   Severe dementia (HCC)   Mild intermittent asthma   Chronic HFrEF (heart failure with reduced ejection fraction) (HCC)   Thrombocytopenia (HCC)  Symptomatic anemia  - No overt bleeding, no abdominal pain, BUN lower now - Status post 3u PRBC   Thrombocytopenia  - Repeat labs outpatient as appropriate    Chronic HFrEF  - EF <20% on TTE from September 2024  - Compensated despite transfusion  - Beta-blocker and ARB d/c'd in September d/t hypotension  - Continue torsemide    CKD IV  - Generally downtrending given above     Asthma  - Not in exacerbation   - Albuterol  as-needed    Dementia, advanced - Continue Aricept  and Zyprexa ,at baseline  Discharge Instructions   Allergies as of 08/26/2023   No Known Allergies      Medication List     TAKE these medications    albuterol  (2.5 MG/3ML) 0.083% nebulizer solution Commonly known as: PROVENTIL  Take 3 mLs (2.5 mg total) by nebulization every 6 (six) hours as needed for wheezing or shortness of breath.   Aspirin  Low Dose 81 MG tablet Generic drug: aspirin   EC TAKE 1 TABLET EVERY DAY   donepezil  10 MG tablet Commonly known as: ARICEPT  Take 10 mg by mouth daily.   guaiFENesin 600 MG 12 hr tablet Commonly known as: MUCINEX Take 600 mg by mouth 2 (two) times daily as needed for cough.   hydroxyurea  500 MG capsule Commonly known as: HYDREA  Take 1 capsule (500 mg total) by mouth 2 (two) times daily. May take with food to minimize GI side effects.   MEGA MULTIVITAMIN FOR MEN PO Take 1 tablet by mouth daily.   metolazone  5 MG tablet Commonly known as: ZAROXOLYN  Take 5 mg by mouth daily as needed (edema).   metoprolol  succinate 25 MG 24 hr tablet Commonly known as: TOPROL -XL Take 25 mg by mouth daily.   OLANZapine  5 MG tablet Commonly known as: ZYPREXA  Take 1 tablet (5 mg total) by mouth at bedtime.   PHENobarbital  32.4 MG tablet Commonly known as: LUMINAL Take 32.4 mg by mouth at bedtime.   torsemide  10 MG tablet Commonly known as: DEMADEX  TAKE 1 TABLET(10 MG) BY MOUTH TWICE DAILY What changed: See the new instructions.        No Known Allergies  Consultations: None   Procedures/Studies: DG Chest Port 1 View Result Date: 08/25/2023 CLINICAL DATA:  Generalized weakness EXAM: PORTABLE CHEST 1 VIEW COMPARISON:  04/23/2023.  Older exams as well. FINDINGS: Enlarged cardiopericardial silhouette. No pneumothorax, effusion or edema. Degenerative changes of the spine. The left inferior costophrenic angle is clipped off the edge of the film. Subtle left midlung opacity. IMPRESSION: Enlarged  heart.  Subtle left midlung opacity.  Recommend follow-up. Electronically Signed   By: Ranell Bring M.D.   On: 08/25/2023 16:37     Subjective: No acute issue/events overnight - tolerated transfusion well   Discharge Exam: Vitals:   08/26/23 1352 08/26/23 1631  BP: (!) 103/54 (!) (P) 99/49  Pulse: 80 (P) 77  Resp: 18 (P) 18  Temp: 97.7 F (36.5 C) (P) 98.4 F (36.9 C)  SpO2: 98% (P) 98%   Vitals:   08/26/23 0749 08/26/23 1313  08/26/23 1352 08/26/23 1631  BP: 113/71 (!) 98/55 (!) 103/54 (!) (P) 99/49  Pulse: (!) 47 81 80 (P) 77  Resp: 18 18 18  (P) 18  Temp: 98.8 F (37.1 C) 98.2 F (36.8 C) 97.7 F (36.5 C) (P) 98.4 F (36.9 C)  TempSrc: Oral Oral Oral (P) Oral  SpO2: 100% 100% 98% (P) 98%  Weight:      Height:        General: Pt is alert, awake, not in acute distress Cardiovascular: RRR, S1/S2 +, no rubs, no gallops Respiratory: CTA bilaterally, no wheezing, no rhonchi Abdominal: Soft, NT, ND, bowel sounds + Extremities: no clubbing, no cyanosis   The results of significant diagnostics from this hospitalization (including imaging, microbiology, ancillary and laboratory) are listed below for reference.     Microbiology: No results found for this or any previous visit (from the past 240 hours).   Labs: BNP (last 3 results) Recent Labs    08/31/22 1111 04/09/23 1230 04/19/23 1645  BNP 4,276.1* >5000.0* >4,500.0*   Basic Metabolic Panel: Recent Labs  Lab 08/25/23 1444 08/26/23 0338  NA 138 139  K 3.5 3.5  CL 101 100  CO2 25 26  GLUCOSE 102* 110*  BUN 53* 52*  CREATININE 2.22* 2.20*  CALCIUM 9.0 8.7*  MG  --  2.5*   Liver Function Tests: Recent Labs  Lab 08/25/23 1444 08/26/23 0338  AST 20 21  ALT 9 9  ALKPHOS 148* 135*  BILITOT 0.8 1.8*  PROT 7.5 6.8  ALBUMIN 3.3* 3.0*    Recent Labs  Lab 08/25/23 1443  AMMONIA 35   CBC: Recent Labs  Lab 08/25/23 1444 08/26/23 0338 08/26/23 0833  WBC 4.7 3.6* 4.4  NEUTROABS 3.4  --   --   HGB 4.3* 6.3* 6.6*  HCT 13.9* 18.8* 19.7*  MCV 128.7* 100.0 101.0*  PLT 76* 66* 64*   Urinalysis    Component Value Date/Time   COLORURINE YELLOW 08/25/2023 1737   APPEARANCEUR CLEAR 08/25/2023 1737   APPEARANCEUR Cloudy (A) 08/31/2022 1111   LABSPEC 1.012 08/25/2023 1737   LABSPEC 1.015 04/06/2022 1307   PHURINE 5.0 08/25/2023 1737   GLUCOSEU NEGATIVE 08/25/2023 1737   HGBUR NEGATIVE 08/25/2023 1737   BILIRUBINUR NEGATIVE  08/25/2023 1737   BILIRUBINUR CANCELED 08/31/2022 1111   KETONESUR NEGATIVE 08/25/2023 1737   PROTEINUR NEGATIVE 08/25/2023 1737   NITRITE NEGATIVE 08/25/2023 1737   LEUKOCYTESUR NEGATIVE 08/25/2023 1737   Sepsis Labs Recent Labs  Lab 08/25/23 1444 08/26/23 0338 08/26/23 0833  WBC 4.7 3.6* 4.4   Microbiology No results found for this or any previous visit (from the past 240 hours).   Time coordinating discharge: Over 30 minutes  SIGNED:   Elsie JAYSON [place], DO [facility] 08/26/2023, 4:36 PM Pager   If 7PM-7AM, please contact night-coverage [email or web address]

## 2023-08-26 NOTE — Progress Notes (Addendum)
 15 min after transfusion No reactions at this time

## 2023-08-26 NOTE — Progress Notes (Addendum)
   Palliative Medicine Inpatient Follow Up Note   The PMT has acknowledged the consultation for [name].   [name] is active with [facility] hosp[facility]. Per Liaison, [name] plan at this time is for blood transfusions and readmission back home.  At this time inpatient Palliative Services are not sought though we are happy to get involved it need be.  Dr. Lue and [name] aware of the above via secure chat conversation.  Per Isaiah, plan for blood transfusion(s) and transition home with hospice this evening.   No Charge ______________________________________________________________________________________ [name] Palliative Medicine Team Team Cell Phone: [phone] Please utilize secure chat with additional questions, if there is no response within 30 minutes please call the above phone number  Palliative Medicine Team providers are available by phone from 7am to 7pm daily and can be reached through the team cell phone.  Should this patient require assistance outside of these hours, please call the patient's attending physician.

## 2023-08-26 NOTE — Progress Notes (Addendum)
 James Bryant 3W78 James Bryant Liaison Note   James Bryant is a current James Bryant patient with James Bryant with a terminal diagnosis of acute on chronic CHF and a secondary diagnosis of thrombocytopenia. James Bryant was brought in yesterday via EMS after family stated patient had been progressively more lethargic and weak for several days.  It was noted in labwork his hemoglobin was 4.3 and he was subsequently admitted for blood transfusion. Per Dr. Curlee Fines Bryant, this is a related hospital admission.   Patient is GIP appropriate due to need for blood transfusion and monitoring associated with this.   Visited patient in hospital, bedside RN present.  Patient states he is feeling better today after 2 units of blood.  Per his nurse, he is scheduled for one additional unit with discharge home if family agreeable.  Plan was confirmed with MD.  I spoke with patient's daughter via phone to make her aware of plan and she is agreeable and states the family has everything necessary to care for patient at home.   V/S: 97.7/80/18   103/54   O2 99% on RA   I/0: 770/500   Abnormal labs: RBC 1.95, Hgb 6.6, HCT 19.7, Platelets 64, BUN 52, Creatinine 2.20, Calcium 8.7, Glucose 110, Magnesium 2.5    Diagnostics:  Narrative & Impression  CLINICAL DATA:  Generalized weakness   EXAM: PORTABLE CHEST 1 VIEW   COMPARISON:  04/23/2023.  Older exams as well.   FINDINGS: Enlarged cardiopericardial silhouette. No pneumothorax, effusion or edema. Degenerative changes of the spine. The left inferior costophrenic angle is clipped off the edge of the film. Subtle left midlung opacity.   IMPRESSION: Enlarged heart.  Subtle left midlung opacity.  Recommend follow-up.     Electronically Signed   By: James Bryant M.D.   On: 08/25/2023 16:37      IV/PRN Medications: Lasix  20mg  x 1, 2 units PRBC   Assessment & Plan from 1.4.24 James Bryant Assessment/Plan    1. Symptomatic  anemia  - No overt bleeding, no abdominal pain, BUN lower now - He appears to be tolerating severe anemia fairly well and there has likely been a slow drop  - Discussed options with family and agreed on plan to stop Hydrea , transfuse 2 units RBC now, consider a 3rd unit after post-transfusion CBC, and then return home with James Bryant    2. Thrombocytopenia  - Platelets 76,000 on admission  - Stop Hydrea  and monitor     3. Chronic HFrEF  - EF <20% on TTE from September 2024  - Appears compensated  - Beta-blocker and ARB d/c'd in September d/t hypotension  - Give IV Lasix  with blood transfusion tonight, resume torsemide  in am     4. CKD IV  - SCr is 2.22, down from admission in September  - Renally-dose medications     5. Asthma  - Not in exacerbation   - Albuterol  as-needed    6. Dementia  - Continue Aricept  and Zyprexa , use delirium precautions    Discharge Planning: Current plan is for patient to discharge home after 3rd unit of PRBC.  Family aware of plan.   Family Contact: Spoke with daughter via phone.   IDT: updated   Goals of Care: Transfusion and then discharge home.   Should patient need ambulance transport at discharge. Please use James Bryant as Print Production Planner this service for our current James Bryant patients.   Please call with any questions. James Skillern, RN, [facility] Liaison [phone]

## 2023-08-27 LAB — TYPE AND SCREEN
ABO/RH(D): B POS
Antibody Screen: NEGATIVE
Unit division: 0
Unit division: 0
Unit division: 0

## 2023-08-27 LAB — BPAM RBC
Blood Product Expiration Date: 202501232359
Blood Product Expiration Date: 202501242359
Blood Product Expiration Date: 202501232359
ISSUE DATE / TIME: 202501051326
ISSUE DATE / TIME: 202501042113
ISSUE DATE / TIME: 202501050035
Unit Type and Rh: 7300
Unit Type and Rh: 202501232359
Unit Type and Rh: 7300
Unit Type and Rh: 7300

## 2023-08-29 ENCOUNTER — Ambulatory Visit (INDEPENDENT_AMBULATORY_CARE_PROVIDER_SITE_OTHER): Payer: Medicare HMO | Admitting: Medical

## 2023-08-29 VITALS — BP 110/50 | HR 48 | Wt 145.8 lb

## 2023-08-29 DIAGNOSIS — F03C18 Unspecified dementia, severe, with other behavioral disturbance: Secondary | ICD-10-CM | POA: Diagnosis not present

## 2023-08-29 DIAGNOSIS — Z7409 Other reduced mobility: Secondary | ICD-10-CM

## 2023-08-29 DIAGNOSIS — I5022 Chronic systolic (congestive) heart failure: Secondary | ICD-10-CM

## 2023-08-29 DIAGNOSIS — D649 Anemia, unspecified: Secondary | ICD-10-CM | POA: Diagnosis not present

## 2023-08-29 DIAGNOSIS — D75839 Thrombocytosis, unspecified: Secondary | ICD-10-CM | POA: Diagnosis not present

## 2023-08-29 DIAGNOSIS — D45 Polycythemia vera: Secondary | ICD-10-CM | POA: Diagnosis not present

## 2023-08-29 DIAGNOSIS — N184 Chronic kidney disease, stage 4 (severe): Secondary | ICD-10-CM | POA: Diagnosis not present

## 2023-08-29 MED ORDER — POTASSIUM CHLORIDE ER 10 MEQ PO TBCR: 10.0000 meq | EXTENDED_RELEASE_TABLET | Freq: Every day | ORAL | 0 refills | Status: DC | PRN

## 2023-08-29 MED ORDER — FERROUS GLUCONATE 324 (38 FE) MG PO TABS: 324.0000 mg | ORAL_TABLET | Freq: Every day | ORAL | 0 refills | Status: DC

## 2023-08-29 NOTE — Progress Notes (Signed)
 Sent AV summary by mail

## 2023-08-29 NOTE — Progress Notes (Addendum)
 Subjective: Chief Complaint  Patient presents with   Hospitalization Follow-up    Hospital follow-up hemoglobin was low. Does he need to continue Iron  and hydrea  medication.    Here with daughter Madeleine for hospital follow up.  Admit date: 08/25/2023 Discharge date: 08/26/2023  Brief/Interim Summary: James Bryant is a 78 y.o. male with medical history significant for severe dementia, polycythemia vera, CKD stage IV, and end-stage CHF with EF <20% who presents with generalized weakness. Found to be anemic - status post transfusion patient and family request to discharge home back to home hospice.   Discharge Diagnoses:  Principal Problem:   Symptomatic anemia Active Problems:   Polycythemia vera (HCC)   CKD (chronic kidney disease) stage 4, GFR 15-29 ml/min (HCC)   Severe dementia (HCC)   Mild intermittent asthma   Chronic HFrEF (heart failure with reduced ejection fraction) (HCC)   Thrombocytopenia (HCC)  Daughter notes he was feeling weak which prompted the hospital visit.    She notes that they advised he be on less of the Hydroxyurea , and discuss iron  with PCP  Daughter notes that he sits in the chair most of the day but does get up with walker and with assistance to get to the bathroom and occasionally walking around the house.  But for the most part he sits most today.  He nods off and on sleeping throughout the day so and sleeps at night.  Eating okay, talking okay at home.  They have a nurse aide coming in twice per week to bathe him.  Wife and daughter are his primary caregivers.  Hospice has been coming out once weekly  Reviewed medication, using Metolazone  5mg  as needed, Torsemide /Demadex  as needed 10mg  daily both together on days with worse swelling, but others days, neither medication.   They base dosing on either high BP or excess edema.   He was originally prescribed phenobarbital  when he was on hospice the first time a few years ago.  He has continued this as it  seemed to help  Prior to the recent hospitalization he was using hydroxyurea  500 mg twice a day but he has not seen hematology in quite a while.  Hospitalist felt like this does need to be decreased.  He was using this for polycythemia  Daughter has concerns about whether he should be on iron  or multivitamin  No other new problems today   Past Medical History:  Diagnosis Date   Asthma    Calculus of gallbladder without cholecystitis without obstruction 10/18/2022   CHF (congestive heart failure) (HCC)    CKD (chronic kidney disease), stage III (HCC)    Dementia (HCC)    Elevated liver function tests 01/17/2019   Fear of needles 01/17/2019   Former smoker, stopped smoking many years ago 01/17/2019   Hypertension    Polycythemia 01/17/2019   Thrombocytosis 01/17/2019   Current Outpatient Medications on File Prior to Visit  Medication Sig Dispense Refill   albuterol  (PROVENTIL ) (2.5 MG/3ML) 0.083% nebulizer solution Take 3 mLs (2.5 mg total) by nebulization every 6 (six) hours as needed for wheezing or shortness of breath. 150 mL 1   ASPIRIN  LOW DOSE 81 MG tablet TAKE 1 TABLET EVERY DAY 90 tablet 3   donepezil  (ARICEPT ) 10 MG tablet Take 10 mg by mouth daily.     guaiFENesin (MUCINEX) 600 MG 12 hr tablet Take 600 mg by mouth 2 (two) times daily as needed for cough.     hydroxyurea  (HYDREA ) 500 MG capsule Take 1  capsule (500 mg total) by mouth 2 (two) times daily. May take with food to minimize GI side effects. 180 capsule 1   metolazone  (ZAROXOLYN ) 5 MG tablet Take 5 mg by mouth daily as needed (edema).     Multiple Vitamins-Minerals (MEGA MULTIVITAMIN FOR MEN PO) Take 1 tablet by mouth daily.     OLANZapine  (ZYPREXA ) 5 MG tablet Take 1 tablet (5 mg total) by mouth at bedtime. 90 tablet 1   PHENobarbital  (LUMINAL) 32.4 MG tablet Take 32.4 mg by mouth at bedtime.     torsemide  (DEMADEX ) 10 MG tablet TAKE 1 TABLET(10 MG) BY MOUTH TWICE DAILY (Patient taking differently: Take 10 mg by  mouth daily as needed.) 30 tablet 0   metoprolol  succinate (TOPROL -XL) 25 MG 24 hr tablet Take 25 mg by mouth daily. (Patient not taking: Reported on 08/29/2023)     No current facility-administered medications on file prior to visit.    ROS as in subjective    Objective: BP (!) 110/50   Pulse (!) 48   Wt 145 lb 12.8 oz (66.1 kg)   SpO2 98%   BMI 22.84 kg/m   Wt Readings from Last 3 Encounters:  08/29/23 145 lb 12.8 oz (66.1 kg)  08/26/23 135 lb 12.9 oz (61.6 kg)  07/04/23 147 lb (66.7 kg)   General: Seated in wheelchair, no acute distress     Assessment: Encounter Diagnoses  Name Primary?   Symptomatic anemia Yes   Severe dementia with other behavioral disturbance, unspecified dementia type (HCC)    Polycythemia vera (HCC)    CKD (chronic kidney disease) stage 4, GFR 15-29 ml/min (HCC)    Chronic HFrEF (heart failure with reduced ejection fraction) (HCC)    Mobility impaired      Plan: I reviewed his recent emergency department and hospitalization notes, labs, recommendations.  Medicines reconciled.  Symptomatic anemia he received 3 units of packed red blood cells this hospital visit Plan to recheck CBC in 3-4 weeks Resume or continue iron /fergon daily Consider CBC in 3-4 weeks although he generally doesn't do well tolerating venipuncture   CKD 4 GFR running around 27-29 Hydration is good at home per daughter Continue multivitamin   Chronic HFrEF Less than 20% on TTE from September 2024 Beta-blocker and ARB discontinued in September 2024 due to hypotension He continues on metolazone  5 mg plus torsemide  10 mg daily as needed for swelling or shortness of breath if blood pressure elevated or excess edema or weight gain Continue potassium on the days they use the fluid pills   Asthma Continue albuterol  as needed shortness of breath wheezing or tightness in the chest   Dementia He will continue on Aricept  10 mg daily    Insomnia Continue   Phenobarbital  32.4mg  daily Lets try using either no Zyprexa  5 mg or just 1/2 tablet daily for 1-2 weeks.   If no issues, then we may discontinue this   Discontinue Aspirin  to lower pill burden and given overall health status and platelets counts  Polycythemia - change Hydroxyurea  to 500mg  once daily instead of BID   James Bryant was seen today for hospitalization follow-up.  Diagnoses and all orders for this visit:  Symptomatic anemia  Severe dementia with other behavioral disturbance, unspecified dementia type (HCC)  Polycythemia vera (HCC)  CKD (chronic kidney disease) stage 4, GFR 15-29 ml/min (HCC)  Chronic HFrEF (heart failure with reduced ejection fraction) (HCC)  Mobility impaired     F/u in 1 month

## 2023-08-29 NOTE — Patient Instructions (Signed)
  Symptomatic anemia he received 3 units of packed red blood cells this hospital visit Plan to recheck CBC in 3-4 weeks Resume or continue iron /fergon daily Consider CBC in 3-4 weeks although he generally doesn't do well tolerating venipuncture   CKD 4 GFR running around 27-29 Hydration is good at home per daughter Continue multivitamin   Chronic HFrEF Less than 20% on TTE from September 2024 Beta-blocker and ARB discontinued in September 2024 due to hypotension He continues on metolazone  5 mg plus torsemide  10 mg daily as needed for swelling or shortness of breath if blood pressure elevated or excess edema or weight gain Continue potassium on the days they use the fluid pills   Asthma Continue albuterol  as needed shortness of breath wheezing or tightness in the chest   Dementia He will continue on Aricept  10 mg daily    Insomnia Continue  Phenobarbital  32.4mg  daily Lets try using either no Zyprexa  5 mg or just 1/2 tablet daily for 1-2 weeks.   If no issues, then we may discontinue this   Discontinue Aspirin  to lower pill burden and given overall health status and platelets counts

## 2023-08-30 MED ORDER — HYDROXYUREA 500 MG PO CAPS: 500.0000 mg | ORAL_CAPSULE | Freq: Every day | ORAL | 0 refills | Status: DC

## 2023-08-30 NOTE — Addendum Note (Signed)
 Addended by: Jac Canavan on: 08/30/2023 10:31 AM   Modules accepted: Orders

## 2023-09-01 ENCOUNTER — Other Ambulatory Visit: Payer: Self-pay | Admitting: Medical

## 2023-09-24 ENCOUNTER — Encounter: Payer: Self-pay | Admitting: Podiatry

## 2023-09-24 ENCOUNTER — Ambulatory Visit (INDEPENDENT_AMBULATORY_CARE_PROVIDER_SITE_OTHER): Payer: Medicare HMO | Admitting: Podiatry

## 2023-09-24 VITALS — Ht 67.0 in

## 2023-09-24 DIAGNOSIS — M79675 Pain in left toe(s): Secondary | ICD-10-CM

## 2023-09-24 DIAGNOSIS — N184 Chronic kidney disease, stage 4 (severe): Secondary | ICD-10-CM | POA: Diagnosis not present

## 2023-09-24 DIAGNOSIS — B351 Tinea unguium: Secondary | ICD-10-CM

## 2023-09-24 DIAGNOSIS — M79674 Pain in right toe(s): Secondary | ICD-10-CM | POA: Diagnosis not present

## 2023-09-24 NOTE — Progress Notes (Signed)
This patient returns to my office for at risk foot care.  This patient requires this care by a professional since this patient will be at risk due to having  CKD.    This patient is unable to cut nails himself since the patient cannot reach his nails.These nails are painful walking and wearing shoes.  He is brought to the office with male caregiver. This patient presents for at risk foot care today.  General Appearance  Alert, conversant and in no acute stress.  Vascular  Dorsalis pedis and posterior tibial  pulses are  weakly palpable  bilaterally.  Capillary return is within normal limits  bilaterally. Temperature is within normal limits  bilaterally.  Neurologic  Senn-Weinstein monofilament wire test within normal limits  bilaterally. Muscle power within normal limits bilaterally.  Nails Thick disfigured discolored nails with subungual debris  from hallux to fifth toes bilaterally. No evidence of bacterial infection or drainage bilaterally.  Orthopedic  No limitations of motion  feet .  No crepitus or effusions noted.  No bony pathology or digital deformities noted.  Skin  normotropic skin with no porokeratosis noted bilaterally.  No signs of infections or ulcers noted.     Onychomycosis  Pain in right toes  Pain in left toes  Consent was obtained for treatment procedures.   Mechanical debridement of nails 1-5  bilaterally performed with a nail nipper.  Filed with dremel without incident.    Return office visit   4 months                    Told patient to return for periodic foot care and evaluation due to potential at risk complications.   Helane Gunther DPM

## 2023-09-26 ENCOUNTER — Telehealth: Payer: Self-pay | Admitting: Medical

## 2023-09-26 DIAGNOSIS — D649 Anemia, unspecified: Secondary | ICD-10-CM

## 2023-09-26 NOTE — Telephone Encounter (Signed)
 Dtr called & hospice needs orders for the lab work that you wanted

## 2023-09-26 NOTE — Telephone Encounter (Signed)
 I have placed future order. You can schedule patient

## 2023-09-28 NOTE — Telephone Encounter (Signed)
Called daughter & informed.

## 2023-10-18 ENCOUNTER — Telehealth: Payer: Self-pay | Admitting: Internal Medicine

## 2023-10-18 NOTE — Telephone Encounter (Signed)
 Copied from CRM [id number]. Topic: General - Other >> Oct 18, 2023  2:25 PM Hamdi H wrote: Reason for CRM: Leanne calling from [facility] wanted to let the provider know that the patient is there for respite care for 5 nights.

## 2023-10-21 ENCOUNTER — Emergency Department
Admission: EM | Admit: 2023-10-21 | Discharge: 2023-10-21 | Disposition: A | Discharge status: Hospice | Attending: Emergency Medicine | Admitting: Emergency Medicine

## 2023-10-21 DIAGNOSIS — E860 Dehydration: Secondary | ICD-10-CM | POA: Diagnosis not present

## 2023-10-21 DIAGNOSIS — I509 Heart failure, unspecified: Secondary | ICD-10-CM | POA: Insufficient documentation

## 2023-10-21 DIAGNOSIS — I4891 Unspecified atrial fibrillation: Secondary | ICD-10-CM | POA: Diagnosis not present

## 2023-10-21 DIAGNOSIS — I959 Hypotension, unspecified: Secondary | ICD-10-CM | POA: Diagnosis not present

## 2023-10-21 DIAGNOSIS — N189 Chronic kidney disease, unspecified: Secondary | ICD-10-CM | POA: Insufficient documentation

## 2023-10-21 DIAGNOSIS — I130 Hypertensive heart and chronic kidney disease with heart failure and stage 1 through stage 4 chronic kidney disease, or unspecified chronic kidney disease: Secondary | ICD-10-CM | POA: Diagnosis not present

## 2023-10-21 DIAGNOSIS — J45909 Unspecified asthma, uncomplicated: Secondary | ICD-10-CM | POA: Diagnosis not present

## 2023-10-21 DIAGNOSIS — R55 Syncope and collapse: Secondary | ICD-10-CM | POA: Diagnosis not present

## 2023-10-21 DIAGNOSIS — I468 Cardiac arrest due to other underlying condition: Secondary | ICD-10-CM | POA: Diagnosis not present

## 2023-10-21 LAB — CBC
HCT: 32.1 % — ABNORMAL LOW (ref 39.0–52.0)
Hemoglobin: 10.2 g/dL — ABNORMAL LOW (ref 13.0–17.0)
MCH: 33.6 pg (ref 26.0–34.0)
MCHC: 31.8 g/dL (ref 30.0–36.0)
MCV: 105.6 fL — ABNORMAL HIGH (ref 80.0–100.0)
Platelets: 254 10*3/uL (ref 150–400)
RBC: 3.04 MIL/uL — ABNORMAL LOW (ref 4.22–5.81)
RDW: 21 % — ABNORMAL HIGH (ref 11.5–15.5)
WBC: 6.2 10*3/uL (ref 4.0–10.5)
nRBC: 0 % (ref 0.0–0.2)

## 2023-10-21 LAB — BASIC METABOLIC PANEL
Anion gap: 11 (ref 5–15)
BUN: 61 mg/dL — ABNORMAL HIGH (ref 8–23)
CO2: 33 mmol/L — ABNORMAL HIGH (ref 22–32)
Calcium: 9.2 mg/dL (ref 8.9–10.3)
Chloride: 94 mmol/L — ABNORMAL LOW (ref 98–111)
Creatinine, Ser: 2.77 mg/dL — ABNORMAL HIGH (ref 0.61–1.24)
GFR, Estimated: 23 mL/min — ABNORMAL LOW (ref >60)
Glucose, Bld: 122 mg/dL — ABNORMAL HIGH (ref 70–99)
Potassium: 3.5 mmol/L (ref 3.5–5.1)
Sodium: 138 mmol/L (ref 135–145)

## 2023-10-21 LAB — CBG MONITORING, ED: Glucose-Capillary: 127 mg/dL — ABNORMAL HIGH (ref 70–99)

## 2023-10-21 MED ORDER — SODIUM CHLORIDE 0.9 % IV BOLUS
500.0000 mL | Freq: Once | INTRAVENOUS | Status: AC
  Administered 2023-10-21: 500 mL via INTRAVENOUS

## 2023-10-21 NOTE — Progress Notes (Signed)
 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  He was at the [facility] for a hospice respite stay when he had a syncopal episode.  Patient is a full code with [place] so he was sent to the ED for further evaluation at family request.  Please call Hospice Home if patient discharges from the ED.  [phone].    Thank you, Norris Cross, RN Nurse Liaison [phone]

## 2023-10-21 NOTE — ED Triage Notes (Addendum)
 First Nurse Note;  Pt via [facility] from [facility]. Pt had a vasovagal episode after a BM. Initial was 88/60 and recent BP was 137/65, HR 84. 98% on 2L [place] chronically, 250 CBG. Pt is at his baseline mentally.

## 2023-10-21 NOTE — ED Provider Notes (Signed)
 [facility] Provider Note    Event Date/Time   First MD Initiated Contact with Patient 10/21/23 1708     (approximate)   History   Loss of Consciousness   HPI  James Bryant is a 78 y.o. male with a history of asthma, CHF, CKD, dementia, hypertension who presents after a syncopal/near syncopal episode.  Patient became lightheaded after a bowel movement.  Initially was borderline hypotensive, at this time he is feeling well and has no complaints.  Caregiver reports he is at his baseline     Physical Exam   Triage Vital Signs: ED Triage Vitals  Encounter Vitals Group     BP 10/21/23 1559 (!) 102/57     Systolic BP Percentile --      Diastolic BP Percentile --      Pulse Rate 10/21/23 1559 87     Resp 10/21/23 1559 18     Temp 10/21/23 1559 98.3 F (36.8 C)     Temp Source 10/21/23 1559 Oral     SpO2 10/21/23 1559 100 %     Weight --      Height --      Head Circumference --      Peak Flow --      Pain Score 10/21/23 1552 0     Pain Loc --      Pain Education --      Exclude from Growth Chart --     Most recent vital signs: Vitals:   10/21/23 1559  BP: (!) 102/57  Pulse: 87  Resp: 18  Temp: 98.3 F (36.8 C)  SpO2: 100%     General: Awake, no distress.  CV:  Good peripheral perfusion.  Resp:  Normal effort.  Abd:  No distention.  Other:     ED Results / Procedures / Treatments   Labs (all labs ordered are listed, but only abnormal results are displayed) Labs Reviewed  BASIC METABOLIC PANEL - Abnormal; Notable for the following components:      Result Value   Chloride 94 (*)    CO2 33 (*)    Glucose, Bld 122 (*)    BUN 61 (*)    Creatinine, Ser 2.77 (*)    GFR, Estimated 23 (*)    All other components within normal limits  CBC - Abnormal; Notable for the following components:   RBC 3.04 (*)    Hemoglobin 10.2 (*)    HCT 32.1 (*)    MCV 105.6 (*)    RDW 21.0 (*)    All other components within normal limits  CBG  MONITORING, ED - Abnormal; Notable for the following components:   Glucose-Capillary 127 (*)    All other components within normal limits  URINALYSIS, ROUTINE W REFLEX MICROSCOPIC     EKG  ED ECG REPORT I, Jene Every, the attending physician, personally viewed and interpreted this ECG.  Date: 10/21/2023  Rhythm: normal sinus rhythm QRS Axis: normal Intervals: normal ST/T Wave abnormalities: normal Narrative Interpretation: no evidence of acute ischemia    RADIOLOGY     PROCEDURES:  Critical Care performed:   Procedures   MEDICATIONS ORDERED IN ED: Medications  sodium chloride 0.9 % bolus 500 mL (500 mLs Intravenous New Bag/Given 10/21/23 1819)     IMPRESSION / MDM / ASSESSMENT AND PLAN / ED COURSE  I reviewed the triage vital signs and the nursing notes. Patient's presentation is most consistent with acute presentation with potential threat to life or  bodily function.  Patient presents after syncopal episode, this did occur after a bowel movement, suspicious for vasovagal syncope, dehydration is on the differential.  No palpitations or chest pain, at his baseline now  Lab work demonstrates mild increase in BUN/creatinine ratio consistent with dehydration, will give IV fluids, otherwise generally reassuring workup,  Patient has received IV fluids, is feeling at his baseline, appropriate for discharge at this time     FINAL CLINICAL IMPRESSION(S) / ED DIAGNOSES   Final diagnoses:  Dehydration     Rx / DC Orders   ED Discharge Orders     None        Note:  This document was prepared using Dragon voice recognition software and may include unintentional dictation errors.   Jene Every, MD 10/21/23 Norberta Keens

## 2023-10-31 ENCOUNTER — Ambulatory Visit (INDEPENDENT_AMBULATORY_CARE_PROVIDER_SITE_OTHER): Admitting: Medical

## 2023-10-31 ENCOUNTER — Encounter: Payer: Self-pay | Admitting: Medical

## 2023-10-31 VITALS — BP 112/58 | HR 94 | Wt 150.6 lb

## 2023-10-31 DIAGNOSIS — Z789 Other specified health status: Secondary | ICD-10-CM

## 2023-10-31 DIAGNOSIS — Z7409 Other reduced mobility: Secondary | ICD-10-CM

## 2023-10-31 DIAGNOSIS — F03C18 Unspecified dementia, severe, with other behavioral disturbance: Secondary | ICD-10-CM

## 2023-10-31 DIAGNOSIS — N184 Chronic kidney disease, stage 4 (severe): Secondary | ICD-10-CM

## 2023-10-31 DIAGNOSIS — Z7189 Other specified counseling: Secondary | ICD-10-CM | POA: Diagnosis not present

## 2023-10-31 DIAGNOSIS — I10 Essential (primary) hypertension: Secondary | ICD-10-CM | POA: Diagnosis not present

## 2023-10-31 DIAGNOSIS — Z9989 Dependence on other enabling machines and devices: Secondary | ICD-10-CM

## 2023-10-31 DIAGNOSIS — G4701 Insomnia due to medical condition: Secondary | ICD-10-CM | POA: Diagnosis not present

## 2023-10-31 DIAGNOSIS — I5022 Chronic systolic (congestive) heart failure: Secondary | ICD-10-CM

## 2023-10-31 DIAGNOSIS — D649 Anemia, unspecified: Secondary | ICD-10-CM

## 2023-10-31 DIAGNOSIS — F40231 Fear of injections and transfusions: Secondary | ICD-10-CM

## 2023-10-31 MED ORDER — DONEPEZIL HCL 10 MG PO TABS: 10.0000 mg | ORAL_TABLET | Freq: Every day | ORAL | 1 refills | Status: DC

## 2023-10-31 MED ORDER — PHENOBARBITAL 32.4 MG PO TABS: 32.4000 mg | ORAL_TABLET | Freq: Every day | ORAL | 0 refills | Status: DC

## 2023-10-31 MED ORDER — OLANZAPINE 5 MG PO TABS: 5.0000 mg | ORAL_TABLET | Freq: Every day | ORAL | 1 refills | Status: DC

## 2023-10-31 MED ORDER — POTASSIUM CHLORIDE ER 10 MEQ PO TBCR: 10.0000 meq | EXTENDED_RELEASE_TABLET | Freq: Every day | ORAL | 0 refills | Status: DC | PRN

## 2023-10-31 MED ORDER — TORSEMIDE 10 MG PO TABS: 10.0000 mg | ORAL_TABLET | Freq: Every day | ORAL | 0 refills | Status: DC | PRN

## 2023-10-31 NOTE — Progress Notes (Signed)
 Subjective:  James Bryant is a 78 y.o. male who presents for Chief Complaint  Patient presents with   Hospitalization Follow-up    Hospital follow-up, went to respite care and had a synope episode. Needs refill on phenobartialol     Here for hospital follow up.  Here with his wife and daughter as usual  Here today for recent emergency department follow-up.  He was at respite care 1.5 week ago recently and he was having a bowel movement and felt lightheaded.  He had a near syncopal episode and was taken to the emergency department where he had evaluation.  He was found to be a little dehydrated, received IV fluids, and was ok by discharge.   His daughter thinks that when he went to the respite care there was still notations about doing torsemide twice daily and metolazone once daily which she had not been doing at home.  She thinks this got him dehydrated  What they were doing at home was he was drinking about 1-1/2 [employer] of water a day which would be probably around 100 ounces of water daily  He was doing metolazone 5 mg daily only if blood pressure was over 100 systolic.  Then 20 minutes later if his blood pressure was still over 100 they would do the torsemide as well along with potassium  However if she thinks that the respite care he was getting metolazone plus the additional torsemide twice daily regardless of blood pressure dose  He is taking a multivitamin daily.  He has not been using Compazine, has not had any nausea problems recently.  He needs a refill on phenobarbital as he uses this and olanzapine for sleep  No new problems.  Family keeps a watch on salty foods and keeps his diet stable  No other aggravating or relieving factors.    No other c/o.   Past Medical History:  Diagnosis Date   Asthma    Calculus of gallbladder without cholecystitis without obstruction 10/18/2022   CHF (congestive heart failure) (HCC)    CKD (chronic kidney disease)  stage 4, GFR 15-29 ml/min (HCC)    Dementia (HCC)    Elevated liver function tests 01/17/2019   Fear of needles 01/17/2019   Former smoker, stopped smoking many years ago 01/17/2019   Hypertension    Polycythemia 01/17/2019   Thrombocytosis 01/17/2019   Current Outpatient Medications on File Prior to Visit  Medication Sig Dispense Refill   albuterol (PROVENTIL) (2.5 MG/3ML) 0.083% nebulizer solution Take 3 mLs (2.5 mg total) by nebulization every 6 (six) hours as needed for wheezing or shortness of breath. 150 mL 1   ferrous gluconate (FERGON) 324 MG tablet Take 1 tablet (324 mg total) by mouth daily with breakfast. 90 tablet 0   hydroxyurea (HYDREA) 500 MG capsule Take 1 capsule (500 mg total) by mouth daily. May take with food to minimize GI side effects. 90 capsule 0   metolazone (ZAROXOLYN) 5 MG tablet Take 5 mg by mouth daily as needed (edema).     prochlorperazine (COMPAZINE) 10 MG tablet Take 10 mg by mouth.     No current facility-administered medications on file prior to visit.    The following portions of the patient's history were reviewed and updated as appropriate: allergies, current medications, past family history, past medical history, past social history, past surgical history and problem list.  ROS Otherwise as in subjective above    Objective: BP (!) 112/58   Pulse 94  Wt 150 lb 9.6 oz (68.3 kg)   SpO2 98%   BMI 23.59 kg/m   BP Readings from Last 3 Encounters:  10/31/23 (!) 112/58  10/21/23 101/66  08/29/23 (!) 110/50   Wt Readings from Last 3 Encounters:  10/31/23 150 lb 9.6 oz (68.3 kg)  08/29/23 145 lb 12.8 oz (66.1 kg)  08/26/23 135 lb 12.9 oz (61.6 kg)   General appearance: alert, no distress, well developed, well nourished Neck: supple, no lymphadenopathy, no thyromegaly, no masses, no JVD Heart: RRR, normal S1, S2, no murmurs Lungs: CTA bilaterally, no wheezes, rhonchi, or rales Pulses: 2+ radial pulses, 2+ pedal pulses, normal cap  refill Ext: no edema   Assessment: Encounter Diagnoses  Name Primary?   Chronic HFrEF (heart failure with reduced ejection fraction) (HCC) Yes   CKD (chronic kidney disease) stage 4, GFR 15-29 ml/min (HCC)    Severe dementia with other behavioral disturbance, unspecified dementia type (HCC)    Dependence on cane    Decreased activities of daily living (ADL)    DNR (do not resuscitate) discussion    Insomnia due to medical condition    Mobility impaired    Primary hypertension    Severe needle phobia    Anemia, unspecified type      Plan: I reviewed his recent emergency department visit.  He likely did get excess diuretics leading to some dehydration which led to the syncopal episode  He is back at home with family.  We updated some recommendations as below that they can use at home and give to the nursing facility if he goes back for respite care.  Use the fluid pills as below but continue the rest of medications as usual  I reviewed all of her labs she just had done on 10/21/2023 including CBC and basic metabolic panel.  Hemoglobin has improved.   Guidelines for giving fluid pill  (Metolazone/Zaroxolyn, Torsemide/Demadex, and potassium adjunct)  Check weights and blood pressure daily in morning  Step 1: If blood pressure top number systolic is over 161 then give Metolazone 5mg .  If BP under 115, then HOLD Metolazone   Goal weight is around 140-145.   If weight in morning is over 145, particularly with any swelling, and if BP at least 115, then give the Metolazone 5mg .   Step 2: If Metolazone is given in the morning, then recheck blood pressure around lunch time or early afternoon at least an hour later  If BP at that time is under 120, then HOLD Torsemide and potassium for the day  If BP at that time is over 120, particularly if any swelling, then you CAN given 1 dose of Torsemide 10mg  and potassium clor con in the afternoon such as 1-2 pm   [name] was seen  today for hospitalization follow-up.  Diagnoses and all orders for this visit:  Chronic HFrEF (heart failure with reduced ejection fraction) (HCC)  CKD (chronic kidney disease) stage 4, GFR 15-29 ml/min (HCC)  Severe dementia with other behavioral disturbance, unspecified dementia type (HCC)  Dependence on cane  Decreased activities of daily living (ADL)  DNR (do not resuscitate) discussion  Insomnia due to medical condition  Mobility impaired  Primary hypertension  Severe needle phobia  Anemia, unspecified type  Other orders -     PHENobarbital (LUMINAL) 32.4 MG tablet; Take 1 tablet (32.4 mg total) by mouth at bedtime. -     donepezil (ARICEPT) 10 MG tablet; Take 1 tablet (10 mg total) by mouth daily. -  OLANZapine (ZYPREXA) 5 MG tablet; Take 1 tablet (5 mg total) by mouth at bedtime. -     torsemide (DEMADEX) 10 MG tablet; Take 1 tablet (10 mg total) by mouth daily as needed. If BP at lunch or after lunch is >120 or if weight >145, or worse swelling, then give dose of Torsemide + potassium Klor Con in afternoon. -     potassium chloride (KLOR-CON) 10 MEQ tablet; Take 1 tablet (10 mEq total) by mouth daily as needed. Only give with days he uses Torsemide    Follow up: 1 to 2 months

## 2023-10-31 NOTE — Patient Instructions (Signed)
  Guidelines for giving fluid pill  (Metolazone/Zaroxolyn, Torsemide/Demadex, and potassium adjunct)  Check weights and blood pressure daily in morning  Step 1: If blood pressure top number systolic is over 409 then give Metolazone 5mg .  If BP under 115, then HOLD Metolazone   Goal weight is around 140-145.   If weight in morning is over 145, particularly with any swelling, and if BP at least 115, then give the Metolazone 5mg .   Step 2: If Metolazone is given in the morning, then recheck blood pressure around lunch time or early afternoon at least an hour later  If BP at that time is under 120, then HOLD Torsemide and potassium for the day  If BP at that time is over 120, particularly if any swelling, then you CAN given 1 dose of Torsemide 10mg  and potassium clor con in the afternoon such as 1-2 pm

## 2023-11-08 ENCOUNTER — Telehealth: Payer: Self-pay | Admitting: Internal Medicine

## 2023-11-08 NOTE — Telephone Encounter (Signed)
-----   Message from Kristian Covey sent at 11/08/2023  5:49 AM EDT ----- Please contact sender of this report.  They still have the torsemide and metolazone instructions wrong despite the recent plan of care I sent back!!  They need to correct this  See recent visit notes from me and last plan of care regarding the dosing instructions of these 2 medications ----- Message ----- From: Shaune Spittle D Sent: 11/01/2023   2:04 PM EDT To: Jac Canavan, PA-C

## 2023-11-08 NOTE — Telephone Encounter (Signed)
 I have refax this with office note and told them to look at office visit for medication dosing

## 2023-11-27 ENCOUNTER — Other Ambulatory Visit: Payer: Self-pay | Admitting: Adult Health

## 2023-11-30 ENCOUNTER — Other Ambulatory Visit: Payer: Self-pay | Admitting: Medical

## 2023-11-30 DIAGNOSIS — D75839 Thrombocytosis, unspecified: Secondary | ICD-10-CM

## 2023-12-10 ENCOUNTER — Other Ambulatory Visit: Payer: Self-pay | Admitting: Physician Assistant

## 2024-01-16 ENCOUNTER — Telehealth: Admitting: Medical

## 2024-01-16 VITALS — BP 128/71 | HR 91 | Wt 146.0 lb

## 2024-01-16 DIAGNOSIS — N184 Chronic kidney disease, stage 4 (severe): Secondary | ICD-10-CM | POA: Diagnosis not present

## 2024-01-16 DIAGNOSIS — R7301 Impaired fasting glucose: Secondary | ICD-10-CM | POA: Diagnosis not present

## 2024-01-16 DIAGNOSIS — J4520 Mild intermittent asthma, uncomplicated: Secondary | ICD-10-CM

## 2024-01-16 DIAGNOSIS — G4701 Insomnia due to medical condition: Secondary | ICD-10-CM

## 2024-01-16 DIAGNOSIS — Z789 Other specified health status: Secondary | ICD-10-CM

## 2024-01-16 DIAGNOSIS — Z7409 Other reduced mobility: Secondary | ICD-10-CM

## 2024-01-16 DIAGNOSIS — Z515 Encounter for palliative care: Secondary | ICD-10-CM

## 2024-01-16 DIAGNOSIS — F03C18 Unspecified dementia, severe, with other behavioral disturbance: Secondary | ICD-10-CM | POA: Diagnosis not present

## 2024-01-16 DIAGNOSIS — I10 Essential (primary) hypertension: Secondary | ICD-10-CM

## 2024-01-16 DIAGNOSIS — I5022 Chronic systolic (congestive) heart failure: Secondary | ICD-10-CM | POA: Diagnosis not present

## 2024-01-16 DIAGNOSIS — Z9989 Dependence on other enabling machines and devices: Secondary | ICD-10-CM

## 2024-01-16 DIAGNOSIS — F40231 Fear of injections and transfusions: Secondary | ICD-10-CM

## 2024-01-16 NOTE — Progress Notes (Signed)
 Subjective:     Patient ID: James Bryant, male   DOB: 10-17-1945, 78 y.o.   MRN: [id number]  This visit type was conducted due to national recommendations for restrictions regarding the COVID-19 Pandemic (e.g. social distancing) in an effort to limit this patient's exposure and mitigate transmission in our community.  Due to their co-morbid illnesses, this patient is at least at moderate risk for complications without adequate follow up.  This format is felt to be most appropriate for this patient at this time.    Documentation for virtual audio and video telecommunications through [place] encounter:  The patient was located at home. The provider was located in the office. The patient did consent to this visit and is aware of possible charges through their insurance for this visit.  The other persons participating in this telemedicine service were none. Time spent on call was 20 minutes and in review of previous records 20 minutes total.  This virtual service is not related to other E/M service within previous 7 days.   HPI Chief Complaint  Patient presents with   Follow-up    2-3 month follow-up, no concerns   Virtual consult for med check.  Daughter/caregiver Shavonndra and wife [name] on the phone as well.  [place] primarily gives the history and info today as usual.  Home health physician recently changed the phenobarbital  to 2 pills nightly along with continuing zyprexa .  Home health through [facility].     His leg swelling is intermittent, some days worse, some days better  No current physical therapy coming to the house.   Nurse comes out 2 days per week  They had an aide coming out regularly to help out but she is back home in [facility] until August.  Currently just Shavonndra and [name] is his caregivers.   Asthma - breathing fine of late, no concerns.  They are moving houses just down the stress soon. And this should give them a little better ease around the house given  his limitations.    No new issues.     Past Medical History:  Diagnosis Date   Asthma    Calculus of gallbladder without cholecystitis without obstruction 10/18/2022   CHF (congestive heart failure) (HCC)    CKD (chronic kidney disease) stage 4, GFR 15-29 ml/min (HCC)    Dementia (HCC)    Elevated liver function tests 01/17/2019   Fear of needles 01/17/2019   Former smoker, stopped smoking many years ago 01/17/2019   Hypertension    Polycythemia 01/17/2019   Thrombocytosis 01/17/2019   Current Outpatient Medications on File Prior to Visit  Medication Sig Dispense Refill   albuterol  (PROVENTIL ) (2.5 MG/3ML) 0.083% nebulizer solution Take 3 mLs (2.5 mg total) by nebulization every 6 (six) hours as needed for wheezing or shortness of breath. 150 mL 1   donepezil  (ARICEPT ) 10 MG tablet TAKE 1 TABLET AT BEDTIME 90 tablet 3   ferrous gluconate  (FERGON) 324 MG tablet Take 1 tablet (324 mg total) by mouth daily with breakfast. 90 tablet 0   hydroxyurea  (HYDREA ) 500 MG capsule TAKE 1 CAPSULE DAILY. MAY TAKE WITH FOOD TO MINIMIZE GI SIDE EFFECTS. 90 capsule 0   metolazone  (ZAROXOLYN ) 5 MG tablet Take 5 mg by mouth daily as needed (edema).     OLANZapine  (ZYPREXA ) 5 MG tablet Take 1 tablet (5 mg total) by mouth at bedtime. 90 tablet 1   PHENobarbital  (LUMINAL) 32.4 MG tablet Take 1 tablet (32.4 mg total) by mouth at bedtime. 90 tablet  0   potassium chloride  (KLOR-CON ) 10 MEQ tablet TAKE 1 TABLET (10 MEQ TOTAL) BY MOUTH DAILY AS NEEDED. ON DAYS HE USES FLUID PILLS, TORSEMIDE  AND METOLAZONE  90 tablet 0   torsemide  (DEMADEX ) 10 MG tablet Take 1 tablet (10 mg total) by mouth daily as needed. If BP at lunch or after lunch is >120 or if weight >145, or worse swelling, then give dose of Torsemide  + potassium Klor Con in afternoon. 90 tablet 0   prochlorperazine (COMPAZINE) 10 MG tablet Take 10 mg by mouth. (Patient not taking: Reported on 01/16/2024)     No current facility-administered medications on  file prior to visit.     Review of Systems As in subjective    Objective:   Physical Exam Due to coronavirus pandemic stay at home measures, patient visit was virtual and they were not examined in person.    Seated on cough with blankets over him     Assessment:     Encounter Diagnoses  Name Primary?   Chronic HFrEF (heart failure with reduced ejection fraction) (HCC) Yes   Impaired fasting blood sugar    CKD (chronic kidney disease) stage 4, GFR 15-29 ml/min (HCC)    Decreased activities of daily living (ADL)    Dependence on cane    Insomnia due to medical condition    Mobility impaired    Mild intermittent asthma without complication    Primary hypertension    Severe dementia with other behavioral disturbance, unspecified dementia type (HCC)    Severe needle phobia    Palliative care by specialist        Plan:     Overall seems stable.  They are moving to a nearby house soon, which should make getting around the house easier for him and the family.    It is nearby.    Sees palliative care, including MD on staff also helps manage his medications and home healthy  History of heart failure, LE edema - stable, uses Torsemide  and Zaroxolyn  prn, potassium with torsemide   Dementia - continue aricept  10mg  daily  Polycythemia - continue Hydroxyurea  500mg  daily  Nurse comes out weekly  He has a wheelchair, potty chair, shower chair, and when they move, home health will be working to secure a hospital bed for him.  Impaired glucose - advised daughter to monitor glucose since they have a meter.  If glucose fasting is 150+ several times in a row, then let me know  Insomnia - continue phenobarbital  32.4mg , 2 tablets at bedtime and olanzapine  5mg  QHS   Stefen was seen today for follow-up.  Diagnoses and all orders for this visit:  Chronic HFrEF (heart failure with reduced ejection fraction) (HCC)  Impaired fasting blood sugar  CKD (chronic kidney disease) stage 4, GFR  15-29 ml/min (HCC)  Decreased activities of daily living (ADL)  Dependence on cane  Insomnia due to medical condition  Mobility impaired  Mild intermittent asthma without complication  Primary hypertension  Severe dementia with other behavioral disturbance, unspecified dementia type (HCC)  Severe needle phobia  Palliative care by specialist   F/u August 2025

## 2024-01-21 ENCOUNTER — Ambulatory Visit: Payer: Medicare HMO | Admitting: Podiatry

## 2024-01-22 ENCOUNTER — Ambulatory Visit (INDEPENDENT_AMBULATORY_CARE_PROVIDER_SITE_OTHER): Admitting: Podiatry

## 2024-01-22 ENCOUNTER — Encounter: Payer: Self-pay | Admitting: Podiatry

## 2024-01-22 DIAGNOSIS — M79674 Pain in right toe(s): Secondary | ICD-10-CM

## 2024-01-22 DIAGNOSIS — M79675 Pain in left toe(s): Secondary | ICD-10-CM | POA: Diagnosis not present

## 2024-01-22 DIAGNOSIS — N184 Chronic kidney disease, stage 4 (severe): Secondary | ICD-10-CM

## 2024-01-22 DIAGNOSIS — B351 Tinea unguium: Secondary | ICD-10-CM | POA: Diagnosis not present

## 2024-01-22 NOTE — Progress Notes (Signed)
 This patient returns to my office for at risk foot care.  This patient requires this care by a professional since this patient will be at risk due to having  CKD.    This patient is unable to cut nails himself since the patient cannot reach his nails.These nails are painful walking and wearing shoes.  He is brought to the office with male caregiver. This patient presents for at risk foot care today.  General Appearance  Alert, conversant and in no acute stress.  Vascular  Dorsalis pedis and posterior tibial  pulses are  absent   bilaterally.  Capillary return is within normal limits  bilaterally. Cold feet  bilaterally.  Neurologic  Senn-Weinstein monofilament wire test diminished  bilaterally. Muscle power within normal limits bilaterally.  Nails Thick disfigured discolored nails with subungual debris  from hallux to fifth toes bilaterally. No evidence of bacterial infection or drainage bilaterally.  Orthopedic  No limitations of motion  feet .  No crepitus or effusions noted.  No bony pathology or digital deformities noted.  Skin  normotropic skin with no porokeratosis noted bilaterally.  No signs of infections or ulcers noted.     Onychomycosis  Pain in right toes  Pain in left toes  Consent was obtained for treatment procedures.   Mechanical debridement of nails 1-5  bilaterally performed with a nail nipper.  Filed with dremel without incident.    Return office visit   3  months                    Told patient to return for periodic foot care and evaluation due to potential at risk complications.   Ruffin Cotton DPM

## 2024-02-05 DIAGNOSIS — M25551 Pain in right hip: Secondary | ICD-10-CM | POA: Diagnosis not present

## 2024-02-05 DIAGNOSIS — M79651 Pain in right thigh: Secondary | ICD-10-CM | POA: Diagnosis not present

## 2024-02-05 DIAGNOSIS — M25561 Pain in right knee: Secondary | ICD-10-CM | POA: Diagnosis not present

## 2024-02-05 DIAGNOSIS — M25511 Pain in right shoulder: Secondary | ICD-10-CM | POA: Diagnosis not present

## 2024-02-05 DIAGNOSIS — M79601 Pain in right arm: Secondary | ICD-10-CM | POA: Diagnosis not present

## 2024-02-25 ENCOUNTER — Other Ambulatory Visit: Payer: Self-pay | Admitting: Medical

## 2024-02-25 DIAGNOSIS — D75839 Thrombocytosis, unspecified: Secondary | ICD-10-CM

## 2024-03-03 ENCOUNTER — Telehealth: Payer: Self-pay | Admitting: Medical

## 2024-03-03 NOTE — Telephone Encounter (Signed)
 I received a message about signing off on ongoing skilled nursing.  I think he has had continual home health skilled nursing.  I am not sure what additional teaching or education they need to do at this point.  I felt like the family has a good handle on things currently.  Are they requesting additional skilled nursing or other a paying out-of-pocket for home health aide currently, and not needing skilled nursing.  The order request shows skilled nursing request for education and symptom management for multiple weeks

## 2024-03-04 NOTE — Telephone Encounter (Signed)
 Left message for pt to call me back

## 2024-03-04 NOTE — Telephone Encounter (Signed)
 Spoke with Daughter Madeleine and she states that she is not sure what they are asking for as they have not notified her of any additional nursing. Patient is currently in respite until Friday and then will come home. She does not think insurance will pay for additional nursing aid or care and she is not sure what symptom or education management is needed.

## 2024-03-04 NOTE — Telephone Encounter (Signed)
 Put on form that we are declining/ holding  additional services right now.

## 2024-03-17 ENCOUNTER — Telehealth: Payer: Self-pay | Admitting: Internal Medicine

## 2024-03-17 NOTE — Telephone Encounter (Signed)
 Called and spoke to patient's daughter to let her know

## 2024-03-17 NOTE — Telephone Encounter (Signed)
 Copied from CRM [id number]. Topic: General - Other >> Mar 17, 2024  8:25 AM Myrick T wrote: Reason for CRM: Patients daughter called to let provider know he is in hospice at [facility] and dr is saying he doesn't think he will live past this week.

## 2024-03-21 DEATH — deceased

## 2024-03-27 ENCOUNTER — Telehealth: Payer: Self-pay | Admitting: Medical

## 2024-03-27 NOTE — Telephone Encounter (Signed)
 Pt wife Graciela came in and wanted to thank you for everything you have done for him and she really appreciates you!

## 2024-04-01 NOTE — Telephone Encounter (Signed)
Sympathy card sent 

## 2024-04-09 ENCOUNTER — Ambulatory Visit: Payer: Medicare HMO | Admitting: Medical

## 2024-04-24 ENCOUNTER — Ambulatory Visit: Admitting: Podiatry
